# Patient Record
Sex: Male | Born: 1970 | Race: Black or African American | Hispanic: No | Smoking: Former smoker
Health system: Southern US, Community
[De-identification: ages and names within clinical notes are randomized; demographics above are authoritative.]

## PROBLEM LIST (undated history)

## (undated) DIAGNOSIS — E7800 Pure hypercholesterolemia, unspecified: Secondary | ICD-10-CM

## (undated) DIAGNOSIS — J45909 Unspecified asthma, uncomplicated: Secondary | ICD-10-CM

## (undated) DIAGNOSIS — I10 Essential (primary) hypertension: Secondary | ICD-10-CM

## (undated) HISTORY — PX: TOTAL HIP ARTHROPLASTY: SHX124

---

## 2008-04-17 ENCOUNTER — Emergency Department (HOSPITAL_COMMUNITY): Admission: EM | Admit: 2008-04-17 | Discharge: 2008-04-17 | Payer: Self-pay | Admitting: Emergency Medicine

## 2008-06-03 ENCOUNTER — Ambulatory Visit: Payer: Self-pay | Admitting: Family Medicine

## 2008-06-03 DIAGNOSIS — J189 Pneumonia, unspecified organism: Secondary | ICD-10-CM

## 2008-06-03 DIAGNOSIS — Z720 Tobacco use: Secondary | ICD-10-CM | POA: Insufficient documentation

## 2008-06-03 DIAGNOSIS — F17200 Nicotine dependence, unspecified, uncomplicated: Secondary | ICD-10-CM

## 2008-07-15 ENCOUNTER — Encounter (INDEPENDENT_AMBULATORY_CARE_PROVIDER_SITE_OTHER): Payer: Self-pay | Admitting: Family Medicine

## 2008-07-28 ENCOUNTER — Encounter (INDEPENDENT_AMBULATORY_CARE_PROVIDER_SITE_OTHER): Payer: Self-pay | Admitting: Family Medicine

## 2008-09-16 ENCOUNTER — Telehealth (INDEPENDENT_AMBULATORY_CARE_PROVIDER_SITE_OTHER): Payer: Self-pay | Admitting: *Deleted

## 2011-04-14 ENCOUNTER — Inpatient Hospital Stay (INDEPENDENT_AMBULATORY_CARE_PROVIDER_SITE_OTHER)
Admission: RE | Admit: 2011-04-14 | Discharge: 2011-04-14 | Disposition: A | Discharge status: Home / Self Care | Payer: 59 | Source: Ambulatory Visit | Attending: Family Medicine | Admitting: Family Medicine

## 2011-04-14 DIAGNOSIS — T148XXA Other injury of unspecified body region, initial encounter: Secondary | ICD-10-CM

## 2011-04-14 LAB — POCT I-STAT, CHEM 8
BUN: 6 mg/dL (ref 6–23)
Calcium, Ion: 1.07 mmol/L — ABNORMAL LOW (ref 1.12–1.32)
Chloride: 98 mEq/L (ref 96–112)
Creatinine, Ser: 1.5 mg/dL (ref 0.4–1.5)
TCO2: 28 mmol/L (ref 0–100)

## 2011-08-13 ENCOUNTER — Other Ambulatory Visit (HOSPITAL_COMMUNITY): Payer: Self-pay | Admitting: Family Medicine

## 2011-08-13 ENCOUNTER — Ambulatory Visit (HOSPITAL_COMMUNITY)
Admission: RE | Admit: 2011-08-13 | Discharge: 2011-08-13 | Disposition: A | Discharge status: Home / Self Care | Payer: 59 | Source: Ambulatory Visit | Attending: Family Medicine | Admitting: Family Medicine

## 2011-08-13 DIAGNOSIS — R1032 Left lower quadrant pain: Secondary | ICD-10-CM

## 2015-04-09 ENCOUNTER — Emergency Department (HOSPITAL_COMMUNITY)
Admission: EM | Admit: 2015-04-09 | Discharge: 2015-04-09 | Disposition: A | Discharge status: Home / Self Care | Payer: BLUE CROSS/BLUE SHIELD | Attending: Emergency Medicine | Admitting: Emergency Medicine

## 2015-04-09 ENCOUNTER — Encounter (HOSPITAL_COMMUNITY): Payer: Self-pay | Admitting: *Deleted

## 2015-04-09 ENCOUNTER — Emergency Department (HOSPITAL_COMMUNITY): Payer: BLUE CROSS/BLUE SHIELD

## 2015-04-09 DIAGNOSIS — J45909 Unspecified asthma, uncomplicated: Secondary | ICD-10-CM | POA: Insufficient documentation

## 2015-04-09 DIAGNOSIS — R0602 Shortness of breath: Secondary | ICD-10-CM | POA: Diagnosis present

## 2015-04-09 DIAGNOSIS — F1010 Alcohol abuse, uncomplicated: Secondary | ICD-10-CM | POA: Diagnosis not present

## 2015-04-09 DIAGNOSIS — Z720 Tobacco use: Secondary | ICD-10-CM | POA: Insufficient documentation

## 2015-04-09 DIAGNOSIS — J40 Bronchitis, not specified as acute or chronic: Secondary | ICD-10-CM

## 2015-04-09 HISTORY — DX: Unspecified asthma, uncomplicated: J45.909

## 2015-04-09 LAB — COMPREHENSIVE METABOLIC PANEL
ALBUMIN: 4.5 g/dL (ref 3.5–5.0)
ALK PHOS: 84 U/L (ref 38–126)
ALT: 34 U/L (ref 17–63)
ANION GAP: 15 (ref 5–15)
AST: 121 U/L — AB (ref 15–41)
BUN: 8 mg/dL (ref 6–20)
CALCIUM: 9.2 mg/dL (ref 8.9–10.3)
CHLORIDE: 97 mmol/L — AB (ref 101–111)
CO2: 30 mmol/L (ref 22–32)
CREATININE: 0.62 mg/dL (ref 0.61–1.24)
GFR calc non Af Amer: >60 mL/min (ref >60)
GLUCOSE: 92 mg/dL (ref 65–99)
POTASSIUM: 3.2 mmol/L — AB (ref 3.5–5.1)
SODIUM: 142 mmol/L (ref 135–145)
Total Bilirubin: 1.1 mg/dL (ref 0.3–1.2)
Total Protein: 7.8 g/dL (ref 6.5–8.1)

## 2015-04-09 LAB — CBC WITH DIFFERENTIAL/PLATELET
BASOS ABS: 0 10*3/uL (ref 0.0–0.1)
Basophils Relative: 1 % (ref 0–1)
Eosinophils Absolute: 0.1 10*3/uL (ref 0.0–0.7)
Eosinophils Relative: 1 % (ref 0–5)
HCT: 39.1 % (ref 39.0–52.0)
HEMOGLOBIN: 13.4 g/dL (ref 13.0–17.0)
LYMPHS ABS: 2.4 10*3/uL (ref 0.7–4.0)
LYMPHS PCT: 53 % — AB (ref 12–46)
MCH: 35 pg — AB (ref 26.0–34.0)
MCHC: 34.3 g/dL (ref 30.0–36.0)
MCV: 102.1 fL — ABNORMAL HIGH (ref 78.0–100.0)
MONO ABS: 0.6 10*3/uL (ref 0.1–1.0)
Monocytes Relative: 13 % — ABNORMAL HIGH (ref 3–12)
NEUTROS ABS: 1.4 10*3/uL — AB (ref 1.7–7.7)
NEUTROS PCT: 32 % — AB (ref 43–77)
Platelets: 123 10*3/uL — ABNORMAL LOW (ref 150–400)
RBC: 3.83 MIL/uL — AB (ref 4.22–5.81)
RDW: 12.5 % (ref 11.5–15.5)
WBC: 4.6 10*3/uL (ref 4.0–10.5)

## 2015-04-09 LAB — ETHANOL: ALCOHOL ETHYL (B): 413 mg/dL — AB (ref <5)

## 2015-04-09 MED ORDER — ALBUTEROL SULFATE (2.5 MG/3ML) 0.083% IN NEBU
2.5000 mg | INHALATION_SOLUTION | Freq: Once | RESPIRATORY_TRACT | Status: AC
  Administered 2015-04-09: 2.5 mg via RESPIRATORY_TRACT
  Filled 2015-04-09: qty 3

## 2015-04-09 MED ORDER — POTASSIUM CHLORIDE CRYS ER 20 MEQ PO TBCR
40.0000 meq | EXTENDED_RELEASE_TABLET | Freq: Once | ORAL | Status: DC
  Filled 2015-04-09: qty 2

## 2015-04-09 MED ORDER — ALBUTEROL SULFATE HFA 108 (90 BASE) MCG/ACT IN AERS
2.0000 | INHALATION_SPRAY | RESPIRATORY_TRACT | Status: DC | PRN
  Administered 2015-04-09: 2 via RESPIRATORY_TRACT
  Filled 2015-04-09: qty 6.7

## 2015-04-09 NOTE — Discharge Instructions (Signed)
Stop drinking and follow up with [facility] to get help with your drinking

## 2015-04-09 NOTE — ED Provider Notes (Signed)
CSN: [id number]     Arrival date & time 04/09/15  1709 History   First MD Initiated Contact with Patient 04/09/15 1806     Chief Complaint  Patient presents with  . Shortness of Breath     (Consider location/radiation/quality/duration/timing/severity/associated sxs/prior Treatment) Patient is a 44 y.o. male presenting with cough. The history is provided by the patient (the pt complains of a cough).  Cough Cough characteristics:  Non-productive Severity:  Mild Onset quality:  Gradual Timing:  Constant Progression:  Waxing and waning Chronicity:  New Smoker: no   Context: not animal exposure   Associated symptoms: no chest pain, no eye discharge, no headaches and no rash     Past Medical History  Diagnosis Date  . Asthma    History reviewed. No pertinent past surgical history. History reviewed. No pertinent family history. History  Substance Use Topics  . Smoking status: Current Every Day Smoker -- 0.50 packs/day    Types: Cigarettes  . Smokeless tobacco: Not on file  . Alcohol Use: Yes     Comment: drinks until he passes out daily. has no desire to stop    Review of Systems  Constitutional: Negative for appetite change and fatigue.  HENT: Negative for congestion, ear discharge and sinus pressure.   Eyes: Negative for discharge.  Respiratory: Positive for cough.   Cardiovascular: Negative for chest pain.  Gastrointestinal: Negative for abdominal pain and diarrhea.  Genitourinary: Negative for frequency and hematuria.  Musculoskeletal: Negative for back pain.  Skin: Negative for rash.  Neurological: Negative for seizures and headaches.  Psychiatric/Behavioral: Negative for hallucinations.      Allergies  Review of patient's allergies indicates no known allergies.  Home Medications   Prior to Admission medications   Not on File   BP 121/88 mmHg  Pulse 113  Temp(Src) 97.3 F (36.3 C) (Oral)  Resp 16  Ht 5\' 7"  (1.702 m)  Wt 130 lb (58.968 kg)  BMI 20.36  kg/m2  SpO2 98% Physical Exam  Constitutional: He is oriented to person, place, and time. He appears well-developed.  HENT:  Head: Normocephalic.  Eyes: Conjunctivae and EOM are normal. No scleral icterus.  Neck: Neck supple. No thyromegaly present.  Cardiovascular: Normal rate and regular rhythm.  Exam reveals no gallop and no friction rub.   No murmur heard. Pulmonary/Chest: No stridor. He has no wheezes. He has no rales. He exhibits no tenderness.  Abdominal: He exhibits no distension. There is no tenderness. There is no rebound.  Musculoskeletal: Normal range of motion. He exhibits no edema.  Lymphadenopathy:    He has no cervical adenopathy.  Neurological: He is oriented to person, place, and time. He exhibits normal muscle tone. Coordination normal.  Skin: No rash noted. No erythema.  Psychiatric: He has a normal mood and affect. His behavior is normal.    ED Course  Procedures (including critical care time) Labs Review Labs Reviewed  CBC WITH DIFFERENTIAL/PLATELET - Abnormal; Notable for the following:    RBC 3.83 (*)    MCV 102.1 (*)    MCH 35.0 (*)    Platelets 123 (*)    Neutrophils Relative % 32 (*)    Neutro Abs 1.4 (*)    Lymphocytes Relative 53 (*)    Monocytes Relative 13 (*)    All other components within normal limits  COMPREHENSIVE METABOLIC PANEL - Abnormal; Notable for the following:    Potassium 3.2 (*)    Chloride 97 (*)    AST 121 (*)  All other components within normal limits  ETHANOL - Abnormal; Notable for the following:    Alcohol, Ethyl (B) 413 (*)    All other components within normal limits    Imaging Review Dg Chest 2 View  04/09/2015   CLINICAL DATA:  Shortness of Breath  EXAM: CHEST  2 VIEW  COMPARISON:  04/17/2008  FINDINGS: The heart size and mediastinal contours are within normal limits. Both lungs are clear. The visualized skeletal structures are unremarkable.  IMPRESSION: No active cardiopulmonary disease.   Electronically Signed    By: Alcide Clever M.D.   On: 04/09/2015 18:08     EKG Interpretation   Date/Time:  Saturday Apr 09 2015 17:33:02 EDT Ventricular Rate:  94 PR Interval:  160 QRS Duration: 90 QT Interval:  370 QTC Calculation: 463 R Axis:   69 Text Interpretation:  Sinus rhythm No old tracing to compare ECG OTHERWISE  WITHIN NORMAL LIMITS Confirmed by Juleen China  MD, STEPHEN ([id number]) on 04/09/2015  6:02:46 PM      MDM   Final diagnoses:  Bronchitis  ETOH abuse    Bronchitis and etoh abuse.  Pt told to stop drinking and referred to out pt tx   Bethann Berkshire, MD 04/09/15 2018

## 2015-04-09 NOTE — ED Notes (Signed)
Discharge instructions given, pt demonstrated teach back and verbal understanding. No concerns voiced.  

## 2015-04-09 NOTE — ED Notes (Addendum)
4 week history of intermittent shortness of breath.  States today "it just choked me up". Also reports anxiety, chest tightness, palpitations. Positive for cough, chills.  Denies n/v/d, sore throat. Patient tearful in triage.

## 2015-04-09 NOTE — ED Notes (Signed)
CRITICAL VALUE ALERT  Critical value received:  ETOH  Date of notification:  04/09/15 Time of notification:  1900  Critical value read back:Yes.    Nurse who received alert:  Yordi Krager,RN  MD notified (1st page):  Dr Estell HarpinZammit  Time of first page:  1900

## 2015-08-15 ENCOUNTER — Encounter (HOSPITAL_COMMUNITY): Payer: Self-pay | Admitting: *Deleted

## 2015-08-15 ENCOUNTER — Emergency Department (HOSPITAL_COMMUNITY): Payer: BLUE CROSS/BLUE SHIELD

## 2015-08-15 ENCOUNTER — Emergency Department (HOSPITAL_COMMUNITY)
Admission: EM | Admit: 2015-08-15 | Discharge: 2015-08-15 | Disposition: A | Discharge status: Home / Self Care | Payer: BLUE CROSS/BLUE SHIELD | Attending: Emergency Medicine | Admitting: Emergency Medicine

## 2015-08-15 DIAGNOSIS — Z720 Tobacco use: Secondary | ICD-10-CM | POA: Insufficient documentation

## 2015-08-15 DIAGNOSIS — J45909 Unspecified asthma, uncomplicated: Secondary | ICD-10-CM | POA: Insufficient documentation

## 2015-08-15 DIAGNOSIS — R569 Unspecified convulsions: Secondary | ICD-10-CM | POA: Diagnosis not present

## 2015-08-15 DIAGNOSIS — F1010 Alcohol abuse, uncomplicated: Secondary | ICD-10-CM

## 2015-08-15 LAB — CBC WITH DIFFERENTIAL/PLATELET
BASOS ABS: 0 10*3/uL (ref 0.0–0.1)
BASOS PCT: 0 %
EOS ABS: 0 10*3/uL (ref 0.0–0.7)
EOS PCT: 0 %
HCT: 36.5 % — ABNORMAL LOW (ref 39.0–52.0)
Hemoglobin: 12.8 g/dL — ABNORMAL LOW (ref 13.0–17.0)
Lymphocytes Relative: 25 %
Lymphs Abs: 1.2 10*3/uL (ref 0.7–4.0)
MCH: 36.8 pg — ABNORMAL HIGH (ref 26.0–34.0)
MCHC: 35.1 g/dL (ref 30.0–36.0)
MCV: 104.9 fL — ABNORMAL HIGH (ref 78.0–100.0)
MONO ABS: 0.5 10*3/uL (ref 0.1–1.0)
Monocytes Relative: 10 %
NEUTROS ABS: 2.9 10*3/uL (ref 1.7–7.7)
Neutrophils Relative %: 64 %
PLATELETS: 117 10*3/uL — AB (ref 150–400)
RBC: 3.48 MIL/uL — ABNORMAL LOW (ref 4.22–5.81)
RDW: 13.2 % (ref 11.5–15.5)
WBC: 4.6 10*3/uL (ref 4.0–10.5)

## 2015-08-15 LAB — COMPREHENSIVE METABOLIC PANEL
ALBUMIN: 4.3 g/dL (ref 3.5–5.0)
ALT: 33 U/L (ref 17–63)
AST: 119 U/L — AB (ref 15–41)
Alkaline Phosphatase: 67 U/L (ref 38–126)
Anion gap: 13 (ref 5–15)
BUN: 6 mg/dL (ref 6–20)
CHLORIDE: 94 mmol/L — AB (ref 101–111)
CO2: 28 mmol/L (ref 22–32)
Calcium: 9.4 mg/dL (ref 8.9–10.3)
Creatinine, Ser: 0.71 mg/dL (ref 0.61–1.24)
GFR calc Af Amer: >60 mL/min (ref >60)
GFR calc non Af Amer: >60 mL/min (ref >60)
GLUCOSE: 157 mg/dL — AB (ref 65–99)
POTASSIUM: 2.7 mmol/L — AB (ref 3.5–5.1)
Sodium: 135 mmol/L (ref 135–145)
Total Bilirubin: 1.4 mg/dL — ABNORMAL HIGH (ref 0.3–1.2)
Total Protein: 7.7 g/dL (ref 6.5–8.1)

## 2015-08-15 LAB — RAPID URINE DRUG SCREEN, HOSP PERFORMED
AMPHETAMINES: NOT DETECTED
BARBITURATES: NOT DETECTED
BENZODIAZEPINES: NOT DETECTED
COCAINE: NOT DETECTED
Opiates: NOT DETECTED
Tetrahydrocannabinol: NOT DETECTED

## 2015-08-15 LAB — AMMONIA: AMMONIA: 55 umol/L — AB (ref 9–35)

## 2015-08-15 LAB — ETHANOL: ALCOHOL ETHYL (B): 9 mg/dL — AB (ref <5)

## 2015-08-15 MED ORDER — POTASSIUM CHLORIDE CRYS ER 20 MEQ PO TBCR
40.0000 meq | EXTENDED_RELEASE_TABLET | Freq: Once | ORAL | Status: AC
  Administered 2015-08-15: 40 meq via ORAL
  Filled 2015-08-15: qty 2

## 2015-08-15 MED ORDER — LORAZEPAM 2 MG/ML IJ SOLN
1.0000 mg | Freq: Once | INTRAMUSCULAR | Status: AC
  Administered 2015-08-15: 1 mg via INTRAVENOUS
  Filled 2015-08-15: qty 1

## 2015-08-15 MED ORDER — POTASSIUM CHLORIDE 10 MEQ/100ML IV SOLN
10.0000 meq | INTRAVENOUS | Status: AC
  Administered 2015-08-15 (×3): 10 meq via INTRAVENOUS
  Filled 2015-08-15 (×3): qty 100

## 2015-08-15 NOTE — ED Provider Notes (Signed)
CSN: [id number]     Arrival date & time 08/15/15  0125 History   First MD Initiated Contact with Patient 08/15/15 0135     Chief Complaint  Patient presents with  . Seizures     (Consider location/radiation/quality/duration/timing/severity/associated sxs/prior Treatment) HPI Comments: Level V caveat for altered mental status. 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 Found lying on the floor with questionable seizure activity and foam coming from his mouth. Patient now awake and alert and oriented 3. States last thing he remembers was working with a hose in the next thing he knows that he EMS was there. He denies any dizziness or lightheadedness. No chest pain or shortness of breath. No headache. No fever. States he's been sick recently with upper respiratory symptoms. No history of seizures. No tongue biting or urinary incontinence. Notably patient is a heavy drinker about 6-7 drinks daily but has not had a drink in about 4 days due to his respiratory illness. States he has not had a seizure from not drinking in the past.  Patient is a 44 y.o. male presenting with seizures. The history is provided by the patient and the EMS personnel. The history is limited by the condition of the patient.  Seizures   Past Medical History  Diagnosis Date  . Asthma    History reviewed. No pertinent past surgical history. History reviewed. No pertinent family history. Social History  Substance Use Topics  . Smoking status: Current Every Day Smoker -- 0.50 packs/day    Types: Cigarettes  . Smokeless tobacco: None  . Alcohol Use: Yes     Comment: drinks until he passes out daily. has no desire to stop    Review of Systems  Constitutional: Negative for fever, activity change and appetite change.  HENT: Negative for congestion and rhinorrhea.   Respiratory: Negative for chest tightness and shortness of breath.   Cardiovascular: Negative for chest pain.  Gastrointestinal: Negative  for nausea, vomiting and abdominal pain.  Genitourinary: Negative for dysuria, hematuria and testicular pain.  Musculoskeletal: Negative for myalgias, back pain and arthralgias.  Skin: Negative for wound.  Neurological: Positive for seizures. Negative for dizziness, weakness and headaches.  A complete 10 system review of systems was obtained and all systems are negative except as noted in the HPI and PMH.      Allergies  Review of patient's allergies indicates no known allergies.  Home Medications   Prior to Admission medications   Not on File   BP 145/95 mmHg  Pulse 74  Temp(Src) 98.3 F (36.8 C) (Oral)  Resp 14  Ht  (1.702 m)  Wt 135 lb (61.236 kg)  BMI 21.14 kg/m2  SpO2 100% Physical Exam  Constitutional: He is oriented to person, place, and time. He appears well-developed and well-nourished. No distress.  Not tremulous  HENT:  Head: Normocephalic and atraumatic.  Mouth/Throat: Oropharynx is clear and moist. No oropharyngeal exudate.  Eyes: Conjunctivae and EOM are normal. Pupils are equal, round, and reactive to light.  Neck: Normal range of motion. Neck supple.  No C spine tenderness  Cardiovascular: Normal rate, regular rhythm, normal heart sounds and intact distal pulses.   No murmur heard. Pulmonary/Chest: Effort normal and breath sounds normal. No respiratory distress. He exhibits no tenderness.  Abdominal: Soft. There is no tenderness. There is no rebound and no guarding.  Musculoskeletal: Normal range of motion. He exhibits no edema or tenderness.  Neurological: He is alert and oriented to person, place, and  time. No cranial nerve deficit. He exhibits normal muscle tone. Coordination normal.  No ataxia on finger to nose bilaterally. No pronator drift. 5/5 strength throughout. CN 2-12 intact.  Equal grip strength. Sensation intact.   Skin: Skin is warm.  Psychiatric: He has a normal mood and affect. His behavior is normal.  Nursing note and vitals  reviewed.   ED Course  Procedures (including critical care time) Labs Review Labs Reviewed  CBC WITH DIFFERENTIAL/PLATELET - Abnormal; Notable for the following:    RBC 3.48 (*)    Hemoglobin 12.8 (*)    HCT 36.5 (*)    MCV 104.9 (*)    MCH 36.8 (*)    Platelets 117 (*)    All other components within normal limits  COMPREHENSIVE METABOLIC PANEL - Abnormal; Notable for the following:    Potassium 2.7 (*)    Chloride 94 (*)    Glucose, Bld 157 (*)    AST 119 (*)    Total Bilirubin 1.4 (*)    All other components within normal limits  ETHANOL - Abnormal; Notable for the following:    Alcohol, Ethyl (B) 9 (*)    All other components within normal limits  AMMONIA - Abnormal; Notable for the following:    Ammonia 55 (*)    All other components within normal limits  URINE RAPID DRUG SCREEN, HOSP PERFORMED    Imaging Review Dg Chest 2 View  08/15/2015   CLINICAL DATA:  Status post seizure. Found lying on floor. Initial encounter.  EXAM: CHEST  2 VIEW  COMPARISON:  Chest radiograph from 04/09/2015  FINDINGS: The lungs are well-aerated and clear. There is no evidence of focal opacification, pleural effusion or pneumothorax.  The heart is normal in size; the mediastinal contour is within normal limits. No acute osseous abnormalities are seen.  IMPRESSION: No acute cardiopulmonary process seen.   Electronically Signed   By: Roanna Raider M.D.   On: 08/15/2015 02:52   Ct Head Wo Contrast  08/15/2015   CLINICAL DATA:  Status post possible seizure.  Initial encounter.  EXAM: CT HEAD WITHOUT CONTRAST  TECHNIQUE: Contiguous axial images were obtained from the base of the skull through the vertex without intravenous contrast.  COMPARISON:  None.  FINDINGS: There is no evidence of acute infarction, mass lesion, or intra- or extra-axial hemorrhage on CT.  Prominence of the sulci suggests mild cortical volume loss. Mild cerebellar atrophy is noted.  The brainstem and fourth ventricle are within  normal limits. The basal ganglia are unremarkable in appearance. The cerebral hemispheres demonstrate grossly normal gray-white differentiation. No mass effect or midline shift is seen.  There is no evidence of fracture; visualized osseous structures are unremarkable in appearance. The orbits are within normal limits. The paranasal sinuses and mastoid air cells are well-aerated. No significant soft tissue abnormalities are seen.  IMPRESSION: 1. No acute intracranial pathology seen on CT. 2. Mild cortical volume loss noted.   Electronically Signed   By: Roanna Raider M.D.   On: 08/15/2015 03:02   I have personally reviewed and evaluated these images and lab results as part of my medical decision-making.   EKG Interpretation   Date/Time:  Monday August 15 2015 01:46:22 EDT Ventricular Rate:  79 PR Interval:  155 QRS Duration: 94 QT Interval:  435 QTC Calculation: 499 R Axis:   45 Text Interpretation:  Sinus rhythm Left ventricular hypertrophy Borderline  prolonged QT interval No significant change was found Confirmed by Manus Gunning   MD,  Tisheena Maguire [phone]) on 08/15/2015 1:53:52 AM      MDM   Final diagnoses:  Seizure (HCC)  Alcohol abuse   Patient with apparent first-time seizure activity, possibly related to alcohol withdrawal. Patient is not tremulous and not tachycardic however.  IV Ativan, CT head, labs  CT head is normal. Potassium low at 2.7. Ammonia high at 55. Alcohol level 9  Potassium replaced. Patient now somnolent after ativan.  No tachycardia.  No tremors. No evidence of DTs.  7am, patient awake, alert, oriented x3.  He is tolerating PO and ambulatory.  He is at his baseline per his mother in the room. Seems to have new onset seizure. Possibly related to alcohol withdrawal.  No evidence of DTs.  Recommend cessation of alcohol use and followup with neurology. Return precautions discussed.    Glynn Octave, MD 08/15/15 2352

## 2015-08-15 NOTE — ED Notes (Signed)
Patient able to tolerate PO fluids to take PO medications.

## 2015-08-15 NOTE — ED Notes (Signed)
CRITICAL VALUE ALERT  Critical value received: K+ 2.7 Date of notification:  08/15/15  Time of notification:  0215  Critical value read back:Yes.    Nurse who received alert:  JRM  MD notified (1st page):  Rancour  Time of first page:  0215  MD notified (2nd page):  Time of second page:  Responding MD:  Rancour  Time MD responded:  2015

## 2015-08-15 NOTE — ED Notes (Signed)
Pt brought in by [facility] for c/o possible seizure; pt was found by employees lying in the floor with white foam coming from mouth; ems states when they arrived on the scene, pt was in post-ictal stage; pt is alert and oriented at this time

## 2015-08-15 NOTE — ED Notes (Signed)
Patient resting. No distress. Respirations even and unlabored. Family at bedside.

## 2015-08-16 NOTE — Discharge Instructions (Signed)
Seizure, Adult Follow up with the neurologist. Return to the ED if you develop new or worsening symptoms.  A seizure is abnormal electrical activity in the brain. Seizures usually last from 30 seconds to 2 minutes. There are various types of seizures. Before a seizure, you may have a warning sensation (aura) that a seizure is about to occur. An aura may include the following symptoms:   Fear or anxiety.  Nausea.  Feeling like the room is spinning (vertigo).  Vision changes, such as seeing flashing lights or spots. Common symptoms during a seizure include:  A change in attention or behavior (altered mental status).  Convulsions with rhythmic jerking movements.  Drooling.  Rapid eye movements.  Grunting.  Loss of bladder and bowel control.  Bitter taste in the mouth.  Tongue biting. After a seizure, you may feel confused and sleepy. You may also have an injury resulting from convulsions during the seizure. HOME CARE INSTRUCTIONS   If you are given medicines, take them exactly as prescribed by your health care provider.  Keep all follow-up appointments as directed by your health care provider.  Do not swim or drive or engage in risky activity during which a seizure could cause further injury to you or others until your health care provider says it is OK.  Get adequate rest.  Teach friends and family what to do if you have a seizure. They should:  Lay you on the ground to prevent a fall.  Put a cushion under your head.  Loosen any tight clothing around your neck.  Turn you on your side. If vomiting occurs, this helps keep your airway clear.  Stay with you until you recover.  Know whether or not you need emergency care. SEEK IMMEDIATE MEDICAL CARE IF:  The seizure lasts longer than 5 minutes.  The seizure is severe or you do not wake up immediately after the seizure.  You have an altered mental status after the seizure.  You are having more frequent or  worsening seizures. Someone should drive you to the emergency department or call local emergency services (911 in [place] MAKE SURE YOU:  Understand these instructions.  Will watch your condition.  Will get help right away if you are not doing well or get worse. Document Released: 10/26/2000 Document Revised: 08/19/2013 Document Reviewed: 06/10/2013 [facility] -  Patient Information 2015 [place], [place]. This information is not intended to replace advice given to you by your health care provider. Make sure you discuss any questions you have with your health care provider.  Alcohol Use Disorder Alcohol use disorder is a mental disorder. It is not a one-time incident of heavy drinking. Alcohol use disorder is the excessive and uncontrollable use of alcohol over time that leads to problems with functioning in one or more areas of daily living. People with this disorder risk harming themselves and others when they drink to excess. Alcohol use disorder also can cause other mental disorders, such as mood and anxiety disorders, and serious physical problems. People with alcohol use disorder often misuse other drugs.  Alcohol use disorder is common and widespread. Some people with this disorder drink alcohol to cope with or escape from negative life events. Others drink to relieve chronic pain or symptoms of mental illness. People with a family history of alcohol use disorder are at higher risk of losing control and using alcohol to excess.  SYMPTOMS  Signs and symptoms of alcohol use disorder may include the following:   Consumption ofalcohol inlarger amounts or over  a longer period of time than intended.  Multiple unsuccessful attempts to cutdown or control alcohol use.   A great deal of time spent obtaining alcohol, using alcohol, or recovering from the effects of alcohol (hangover).  A strong desire or urge to use alcohol (cravings).   Continued use of alcohol despite problems at work, school,  or home because of alcohol use.   Continued use of alcohol despite problems in relationships because of alcohol use.  Continued use of alcohol in situations when it is physically hazardous, such as driving a car.  Continued use of alcohol despite awareness of a physical or psychological problem that is likely related to alcohol use. Physical problems related to alcohol use can involve the brain, heart, liver, stomach, and intestines. Psychological problems related to alcohol use include intoxication, depression, anxiety, psychosis, delirium, and dementia.   The need for increased amounts of alcohol to achieve the same desired effect, or a decreased effect from the consumption of the same amount of alcohol (tolerance).  Withdrawal symptoms upon reducing or stopping alcohol use, or alcohol use to reduce or avoid withdrawal symptoms. Withdrawal symptoms include:  Racing heart.  Hand tremor.  Difficulty sleeping.  Nausea.  Vomiting.  Hallucinations.  Restlessness.  Seizures. DIAGNOSIS Alcohol use disorder is diagnosed through an assessment by your health care provider. Your health care provider may start by asking three or four questions to screen for excessive or problematic alcohol use. To confirm a diagnosis of alcohol use disorder, at least two symptoms must be present within a 58-month period. The severity of alcohol use disorder depends on the number of symptoms:  Mild--two or three.  Moderate--four or five.  Severe--six or more. Your health care provider may perform a physical exam or use results from lab tests to see if you have physical problems resulting from alcohol use. Your health care provider may refer you to a mental health professional for evaluation. TREATMENT  Some people with alcohol use disorder are able to reduce their alcohol use to low-risk levels. Some people with alcohol use disorder need to quit drinking alcohol. When necessary, mental health  professionals with specialized training in substance use treatment can help. Your health care provider can help you decide how severe your alcohol use disorder is and what type of treatment you need. The following forms of treatment are available:   Detoxification. Detoxification involves the use of prescription medicines to prevent alcohol withdrawal symptoms in the first week after quitting. This is important for people with a history of symptoms of withdrawal and for heavy drinkers who are likely to have withdrawal symptoms. Alcohol withdrawal can be dangerous and, in severe cases, cause death. Detoxification is usually provided in a hospital or in-patient substance use treatment facility.  Counseling or talk therapy. Talk therapy is provided by substance use treatment counselors. It addresses the reasons people use alcohol and ways to keep them from drinking again. The goals of talk therapy are to help people with alcohol use disorder find healthy activities and ways to cope with life stress, to identify and avoid triggers for alcohol use, and to handle cravings, which can cause relapse.  Medicines.Different medicines can help treat alcohol use disorder through the following actions:  Decrease alcohol cravings.  Decrease the positive reward response felt from alcohol use.  Produce an uncomfortable physical reaction when alcohol is used (aversion therapy).  Support groups. Support groups are run by people who have quit drinking. They provide emotional support, advice, and guidance. These  forms of treatment are often combined. Some people with alcohol use disorder benefit from intensive combination treatment provided by specialized substance use treatment centers. Both inpatient and outpatient treatment programs are available. Document Released: 12/06/2004 Document Revised: 03/15/2014 Document Reviewed: 02/05/2013 [facility] Patient Information 2015 [place], [place]. This information is not intended  to replace advice given to you by your health care provider. Make sure you discuss any questions you have with your health care provider.

## 2016-01-22 ENCOUNTER — Emergency Department (HOSPITAL_COMMUNITY)
Admission: EM | Admit: 2016-01-22 | Discharge: 2016-01-22 | Disposition: A | Discharge status: Home / Self Care | Payer: BLUE CROSS/BLUE SHIELD | Attending: Emergency Medicine | Admitting: Emergency Medicine

## 2016-01-22 ENCOUNTER — Encounter (HOSPITAL_COMMUNITY): Payer: Self-pay | Admitting: Emergency Medicine

## 2016-01-22 ENCOUNTER — Emergency Department (HOSPITAL_COMMUNITY): Payer: BLUE CROSS/BLUE SHIELD

## 2016-01-22 DIAGNOSIS — B349 Viral infection, unspecified: Secondary | ICD-10-CM | POA: Diagnosis not present

## 2016-01-22 DIAGNOSIS — R05 Cough: Secondary | ICD-10-CM | POA: Diagnosis present

## 2016-01-22 DIAGNOSIS — F17210 Nicotine dependence, cigarettes, uncomplicated: Secondary | ICD-10-CM | POA: Diagnosis not present

## 2016-01-22 DIAGNOSIS — J45909 Unspecified asthma, uncomplicated: Secondary | ICD-10-CM | POA: Insufficient documentation

## 2016-01-22 DIAGNOSIS — Z202 Contact with and (suspected) exposure to infections with a predominantly sexual mode of transmission: Secondary | ICD-10-CM | POA: Insufficient documentation

## 2016-01-22 LAB — URINALYSIS, ROUTINE W REFLEX MICROSCOPIC
BILIRUBIN URINE: NEGATIVE
GLUCOSE, UA: NEGATIVE mg/dL
Ketones, ur: NEGATIVE mg/dL
Leukocytes, UA: NEGATIVE
NITRITE: NEGATIVE
PH: 6 (ref 5.0–8.0)
Protein, ur: NEGATIVE mg/dL

## 2016-01-22 LAB — URINE MICROSCOPIC-ADD ON
Bacteria, UA: NONE SEEN
RBC / HPF: NONE SEEN RBC/hpf (ref 0–5)
Squamous Epithelial / LPF: NONE SEEN
WBC UA: NONE SEEN WBC/hpf (ref 0–5)

## 2016-01-22 MED ORDER — LIDOCAINE HCL (PF) 1 % IJ SOLN
INTRAMUSCULAR | Status: AC
  Administered 2016-01-22: 13:00:00
  Filled 2016-01-22: qty 5

## 2016-01-22 MED ORDER — HYDROCODONE-HOMATROPINE 5-1.5 MG/5ML PO SYRP: 5.0000 mL | ORAL_SOLUTION | Freq: Four times a day (QID) | ORAL | Status: DC | PRN

## 2016-01-22 MED ORDER — ACETAMINOPHEN 500 MG PO TABS
1000.0000 mg | ORAL_TABLET | Freq: Once | ORAL | Status: AC
  Administered 2016-01-22: 1000 mg via ORAL
  Filled 2016-01-22: qty 2

## 2016-01-22 MED ORDER — CEFTRIAXONE SODIUM 250 MG IJ SOLR
250.0000 mg | Freq: Once | INTRAMUSCULAR | Status: AC
  Administered 2016-01-22: 250 mg via INTRAMUSCULAR
  Filled 2016-01-22: qty 250

## 2016-01-22 MED ORDER — DOXYCYCLINE HYCLATE 100 MG PO CAPS: 100.0000 mg | ORAL_CAPSULE | Freq: Two times a day (BID) | ORAL | Status: DC

## 2016-01-22 MED ORDER — DOXYCYCLINE HYCLATE 100 MG PO TABS
100.0000 mg | ORAL_TABLET | Freq: Two times a day (BID) | ORAL | Status: DC
  Administered 2016-01-22: 100 mg via ORAL
  Filled 2016-01-22: qty 1

## 2016-01-22 MED ORDER — HYDROCOD POLST-CPM POLST ER 10-8 MG/5ML PO SUER
5.0000 mL | Freq: Once | ORAL | Status: AC
  Administered 2016-01-22: 5 mL via ORAL
  Filled 2016-01-22: qty 5

## 2016-01-22 NOTE — ED Notes (Signed)
Patient c/o productive, congested cough x1 week. Per patient thick green sputum. Patient unsure of fevers but reports "chills and sweats with body aches." Patient reports taking TheraFlu 2 days ago with no relief.

## 2016-01-22 NOTE — Discharge Instructions (Signed)
Your examination favors a viral illness. Please wash hands frequently. Please use a mask until symptoms have resolved. Please increase fluids. Use Tylenol every 4 hours, or ibuprofen every 6 hours. Use Hycodan for cough and congestion. Use doxycycline daily with food until all taken. Use Hycodan for cough and congestion. This medication may cause drowsiness, use with caution. Please refrain from all sexual activity for the next 7 days. Viral Infections A virus is a type of germ. Viruses can cause:  Minor sore throats.  Aches and pains.  Headaches.  Runny nose.  Rashes.  Watery eyes.  Tiredness.  Coughs.  Loss of appetite.  Feeling sick to your stomach (nausea).  Throwing up (vomiting).  Watery poop (diarrhea). HOME CARE   Only take medicines as told by your doctor.  Drink enough water and fluids to keep your pee (urine) clear or pale yellow. Sports drinks are a good choice.  Get plenty of rest and eat healthy. Soups and broths with crackers or rice are fine. GET HELP RIGHT AWAY IF:   You have a very bad headache.  You have shortness of breath.  You have chest pain or neck pain.  You have an unusual rash.  You cannot stop throwing up.  You have watery poop that does not stop.  You cannot keep fluids down.  You or your child has a temperature by mouth above 102 F (38.9 C), not controlled by medicine.  Your baby is older than 3 months with a rectal temperature of 102 F (38.9 C) or higher.  Your baby is 723 months old or younger with a rectal temperature of 100.4 F (38 C) or higher. MAKE SURE YOU:   Understand these instructions.  Will watch this condition.  Will get help right away if you are not doing well or get worse.   This information is not intended to replace advice given to you by your health care provider. Make sure you discuss any questions you have with your health care provider.   Document Released: 10/11/2008 Document Revised: 01/21/2012  Document Reviewed: 04/06/2015 [employer] Interactive Patient Education [employer]2016 [employer]

## 2016-01-22 NOTE — ED Provider Notes (Signed)
CSN: [id number]     Arrival date & time 01/22/16  1107 History  By signing my name below, I, Ronney LionSuzanne Le, attest that this documentation has been prepared under the direction and in the presence of Ivery QualeHobson Brodin Gelpi, PA-C. Electronically Signed: Ronney LionSuzanne Le, ED Scribe. 01/22/2016. 12:28 PM.   Chief Complaint  Patient presents with  . Cough   Patient is a 45 y.o. male presenting with cough. The history is provided by the patient. No language interpreter was used.  Cough Cough characteristics:  Productive Sputum characteristics:  Green Severity:  Moderate Onset quality:  Gradual Duration:  1 week Timing:  Constant Progression:  Worsening Relieved by:  Nothing Worsened by:  Nothing tried Ineffective treatments: Theraflu. Associated symptoms: chills, fever (subjective) and myalgias (generalized)   Associated symptoms: no rash    HPI Comments: Donald Stewart is a 45 y.o. male who presents to the Emergency Department with multiple complains, including a gradual-onset, constant, moderate cough productive with thick green sputum that began 1 week ago. He notes associated subjective fever and chills, vomiting, diarrhea, and generalized myalgias. Patient states he tried taking Theraflu 2 days ago, with no relief. He denies a history of any immunocompromising conditions, DM, hepatitis C, organ transplants, or recent international travel.  He denies any rash.   Past Medical History  Diagnosis Date  . Asthma    History reviewed. No pertinent past surgical history. History reviewed. No pertinent family history. Social History  Substance Use Topics  . Smoking status: Current Every Day Smoker -- 0.50 packs/day for 20 years    Types: Cigarettes  . Smokeless tobacco: Never Used  . Alcohol Use: Yes     Comment: drinks until he passes out daily. has no desire to stop    Review of Systems  Constitutional: Positive for fever (subjective) and chills.  Respiratory: Positive for cough.    Gastrointestinal: Positive for vomiting and diarrhea.  Musculoskeletal: Positive for myalgias (generalized).  Skin: Negative for rash.  Allergic/Immunologic: Negative for immunocompromised state.  All other systems reviewed and are negative.  Allergies  Review of patient's allergies indicates no known allergies.  Home Medications   Prior to Admission medications   Not on File   BP 118/85 mmHg  Pulse 90  Temp(Src) 97.7 F (36.5 C) (Oral)  Resp 18  Ht 5\' 7"  (1.702 m)  Wt 120 lb (54.432 kg)  BMI 18.79 kg/m2  SpO2 97% Physical Exam  Constitutional: He is oriented to person, place, and time. He appears well-developed and well-nourished. No distress.  HENT:  Head: Normocephalic and atraumatic.  Some enlargement of the uvula. Uvula is midline. No exudate. Nasal congestion present.   Eyes: Conjunctivae and EOM are normal.  Neck: Normal range of motion. Neck supple. No tracheal deviation present.  No cervical lymphadenopathy. Good ROM of the neck. No rigidity.   Cardiovascular: Normal rate.   Pulmonary/Chest: Effort normal. No respiratory distress. He has rhonchi.  Symmetrical rise and fall of the chest. Patient speaks in complete sentences. Coarse breath sounds. Few scattered rhonchi.   Abdominal: Soft. Bowel sounds are normal.  Abdomen is soft. Good bowel sounds.  Musculoskeletal: Normal range of motion.  Lymphadenopathy:    He has no cervical adenopathy.  Neurological: He is alert and oriented to person, place, and time.  Skin: Skin is warm and dry. No rash noted.  No rash.  Psychiatric: He has a normal mood and affect. His behavior is normal.  Nursing note and vitals reviewed.   ED Course  Procedures (including critical care time)  DIAGNOSTIC STUDIES: Oxygen Saturation is 97% on RA, normal by my interpretation.    COORDINATION OF CARE: 12:25 PM - CXR results discussed with pt. Suspect influenza. Discussed treatment plan with pt at bedside which includes alternating  Tylenol and ibuprofen. Advised rest, hydration, and hygienic practices. Pt verbalized understanding and agreed to plan.   Imaging Review Dg Chest 2 View  01/22/2016  CLINICAL DATA:  Productive cough, congestion for 1 week. EXAM: CHEST  2 VIEW COMPARISON:  08/15/2015 FINDINGS: The heart size and mediastinal contours are within normal limits. Both lungs are clear. The visualized skeletal structures are unremarkable. IMPRESSION: No active cardiopulmonary disease. Electronically Signed   By: Charlett Nose M.D.   On: 01/22/2016 11:47   I have personally reviewed and evaluated these images and lab results as part of my medical decision-making.  MDM  The examination favors viral illness. As the patient is having cough productive, with a green sputum. Have discussed handwashing, using a mass, increase fluids, and use of Tylenol and ibuprofen with patient. Also discussed using Hycodan for cough and congestion. Patient is in agreement with discharge instructions.   Patient called me into his room to discuss discharge from the penis, following him being a three-day party. He states that he had intercourse with someone whom he did not know. The patient was treated in the emergency department with intramuscular Rocephin and oral doxycycline. Prescription for doxycycline was given for him to complete the treatment. Culture sent to the lab. And blood work obtained.    Final diagnoses:  None    *I personally performed the services described in this documentation, which was scribed in my presence. The recorded information has been reviewed and is accurate.*   I have reviewed nursing notes, vital signs, and all appropriate lab and imaging results for this patient.   [name], PA-C 01/22/16 1259  Marily Memos, MD 01/22/16 1515

## 2016-01-23 LAB — RPR: RPR: NONREACTIVE

## 2016-01-23 LAB — GC/CHLAMYDIA PROBE AMP (~~LOC~~) NOT AT ARMC
Chlamydia: NEGATIVE
Neisseria Gonorrhea: NEGATIVE

## 2016-01-23 LAB — HIV ANTIBODY (ROUTINE TESTING W REFLEX): HIV SCREEN 4TH GENERATION: NONREACTIVE

## 2016-12-10 ENCOUNTER — Encounter (HOSPITAL_COMMUNITY): Payer: Self-pay | Admitting: Emergency Medicine

## 2016-12-10 ENCOUNTER — Emergency Department (HOSPITAL_COMMUNITY): Payer: BLUE CROSS/BLUE SHIELD

## 2016-12-10 ENCOUNTER — Emergency Department (HOSPITAL_COMMUNITY)
Admission: EM | Admit: 2016-12-10 | Discharge: 2016-12-10 | Disposition: A | Discharge status: Home / Self Care | Payer: BLUE CROSS/BLUE SHIELD | Attending: Emergency Medicine | Admitting: Emergency Medicine

## 2016-12-10 DIAGNOSIS — J45909 Unspecified asthma, uncomplicated: Secondary | ICD-10-CM | POA: Diagnosis not present

## 2016-12-10 DIAGNOSIS — R05 Cough: Secondary | ICD-10-CM | POA: Diagnosis not present

## 2016-12-10 DIAGNOSIS — R61 Generalized hyperhidrosis: Secondary | ICD-10-CM | POA: Diagnosis not present

## 2016-12-10 DIAGNOSIS — F17210 Nicotine dependence, cigarettes, uncomplicated: Secondary | ICD-10-CM | POA: Insufficient documentation

## 2016-12-10 DIAGNOSIS — M791 Myalgia: Secondary | ICD-10-CM | POA: Diagnosis not present

## 2016-12-10 DIAGNOSIS — J029 Acute pharyngitis, unspecified: Secondary | ICD-10-CM | POA: Insufficient documentation

## 2016-12-10 DIAGNOSIS — Z79899 Other long term (current) drug therapy: Secondary | ICD-10-CM | POA: Insufficient documentation

## 2016-12-10 DIAGNOSIS — H1033 Unspecified acute conjunctivitis, bilateral: Secondary | ICD-10-CM | POA: Diagnosis not present

## 2016-12-10 DIAGNOSIS — R509 Fever, unspecified: Secondary | ICD-10-CM | POA: Diagnosis present

## 2016-12-10 DIAGNOSIS — R6889 Other general symptoms and signs: Secondary | ICD-10-CM

## 2016-12-10 DIAGNOSIS — R0981 Nasal congestion: Secondary | ICD-10-CM | POA: Insufficient documentation

## 2016-12-10 LAB — CBC WITH DIFFERENTIAL/PLATELET
BASOS ABS: 0 10*3/uL (ref 0.0–0.1)
Basophils Relative: 0 %
Eosinophils Absolute: 0 10*3/uL (ref 0.0–0.7)
Eosinophils Relative: 0 %
HCT: 33.8 % — ABNORMAL LOW (ref 39.0–52.0)
HEMOGLOBIN: 12.2 g/dL — AB (ref 13.0–17.0)
LYMPHS ABS: 1.7 10*3/uL (ref 0.7–4.0)
LYMPHS PCT: 19 %
MCH: 36.7 pg — AB (ref 26.0–34.0)
MCHC: 36.1 g/dL — ABNORMAL HIGH (ref 30.0–36.0)
MCV: 101.8 fL — AB (ref 78.0–100.0)
Monocytes Absolute: 0.8 10*3/uL (ref 0.1–1.0)
Monocytes Relative: 9 %
NEUTROS ABS: 6.2 10*3/uL (ref 1.7–7.7)
NEUTROS PCT: 72 %
Platelets: 155 10*3/uL (ref 150–400)
RBC: 3.32 MIL/uL — AB (ref 4.22–5.81)
RDW: 12.6 % (ref 11.5–15.5)
WBC: 8.7 10*3/uL (ref 4.0–10.5)

## 2016-12-10 LAB — BASIC METABOLIC PANEL
ANION GAP: 13 (ref 5–15)
BUN: 8 mg/dL (ref 6–20)
CHLORIDE: 93 mmol/L — AB (ref 101–111)
CO2: 26 mmol/L (ref 22–32)
Calcium: 8.4 mg/dL — ABNORMAL LOW (ref 8.9–10.3)
Creatinine, Ser: 0.78 mg/dL (ref 0.61–1.24)
Glucose, Bld: 70 mg/dL (ref 65–99)
POTASSIUM: 3.3 mmol/L — AB (ref 3.5–5.1)
SODIUM: 132 mmol/L — AB (ref 135–145)

## 2016-12-10 MED ORDER — TOBRAMYCIN 0.3 % OP SOLN
1.0000 [drp] | Freq: Once | OPHTHALMIC | Status: AC
  Administered 2016-12-10: 1 [drp] via OPHTHALMIC
  Filled 2016-12-10: qty 5

## 2016-12-10 MED ORDER — BENZONATATE 100 MG PO CAPS: 200.0000 mg | ORAL_CAPSULE | Freq: Three times a day (TID) | ORAL | 0 refills | Status: DC | PRN

## 2016-12-10 MED ORDER — IBUPROFEN 800 MG PO TABS
800.0000 mg | ORAL_TABLET | Freq: Once | ORAL | Status: AC
  Administered 2016-12-10: 800 mg via ORAL
  Filled 2016-12-10: qty 1

## 2016-12-10 NOTE — ED Triage Notes (Signed)
PT c/o nasal congestion, chills, body aches, fever, cough and sore throat x5 days.

## 2016-12-10 NOTE — ED Provider Notes (Signed)
[facility]-EMERGENCY DEPT Provider Note   CSN: [id number] Arrival date & time: 12/10/16  1643  By signing my name below, I, Cynda Acres, attest that this documentation has been prepared under the direction and in the presence of Burgess Amor, PA-C. Electronically Signed: Cynda Acres, Scribe. 12/10/16. 6:26 PM.   History   Chief Complaint Chief Complaint  Patient presents with  . Generalized Body Aches    HPI Comments: Donald Stewart is a 46 y.o. male who presents to the Emergency Department complaining of constant  generalized body aches that began 5 days ago. Patient has an associated fever, diaphoresis, chills, cough with green phlegm, sore throat,  nasal congestion, and diarrhea (no longer present). Patient has had exposure to the flu. Patient reports taking tylenol and Theraflu with minimal improvement. Patient describes his throat as scratchy.  Patient took his last does of tylenol at [facility]. Patient has been drinking a lot of fluids. Patient did not have a flu shot this year. Patient has not been smoking this week, usually smokes a pack a day. Patient denies any vomiting, chest pain,  or shortness of breath.   The history is provided by the patient. No language interpreter was used.    Past Medical History:  Diagnosis Date  . Asthma     Patient Active Problem List   Diagnosis Date Noted  . TOBACCO ABUSE 06/03/2008  . PNEUMONIA, LEFT 06/03/2008    History reviewed. No pertinent surgical history.     Home Medications    Prior to Admission medications   Medication Sig Start Date End Date Taking? Authorizing Provider  benzonatate (TESSALON) 100 MG capsule Take 2 capsules (200 mg total) by mouth 3 (three) times daily as needed. 12/10/16   Burgess Amor, PA-C  doxycycline (VIBRAMYCIN) 100 MG capsule Take 1 capsule (100 mg total) by mouth 2 (two) times daily. 01/22/16   [name], PA-C  HYDROcodone-homatropine (HYCODAN) 5-1.5 MG/5ML syrup Take 5 mLs by mouth every 6 (six)  hours as needed. 01/22/16   [name], PA-C  potassium chloride SA (K-DUR,KLOR-CON) 20 MEQ tablet Take 1 tablet by mouth daily. 11/25/15   Historical Provider, MD  Vitamin D, Ergocalciferol, (DRISDOL) 50000 units CAPS capsule Take 1 capsule by mouth once a week. 01/07/16   Historical Provider, MD    Family History History reviewed. No pertinent family history.  Social History Social History  Substance Use Topics  . Smoking status: Current Every Day Smoker    Packs/day: 0.50    Years: 20.00    Types: Cigarettes  . Smokeless tobacco: Never Used  . Alcohol use Yes     Comment: drinks until he passes out daily. has no desire to stop     Allergies   Patient has no known allergies.   Review of Systems Review of Systems  Constitutional: Positive for chills, diaphoresis and fever.  HENT: Positive for congestion and sore throat.   Respiratory: Positive for cough.   Gastrointestinal: Negative for abdominal pain, diarrhea, nausea and vomiting.  Musculoskeletal: Positive for myalgias.     Physical Exam Updated Vital Signs BP (!) 157/105   Pulse 66   Temp 98.3 F (36.8 C) (Oral)   Resp 18   Ht 5\' 5"  (1.651 m)   Wt 56.7 kg   SpO2 100%   BMI 20.80 kg/m   Physical Exam  Constitutional: He is oriented to person, place, and time. He appears well-developed.  Patient appears fatigued.  HENT:  Head: Normocephalic and atraumatic.  Mouth/Throat: Oropharynx  is clear and moist.  No posterior exudate or erythema.    Eyes: EOM are normal. Pupils are equal, round, and reactive to light. Right conjunctiva is injected. Left conjunctiva is injected.  Bilateral eyelash matting and thick discharge.  Neck: Normal range of motion. Neck supple.  Cardiovascular: Normal rate.    Borderline tachycardic, no murmur. No ankle edema.   Pulmonary/Chest: Effort normal and breath sounds normal. No respiratory distress. He has no wheezes.  Initial exam with bilateral rhonchi, cleared with cough. No  wheezing.   Abdominal: Soft. Bowel sounds are normal. There is no tenderness.  Musculoskeletal: Normal range of motion.  Neurological: He is alert and oriented to person, place, and time.  Skin: Skin is warm and dry.     ED Treatments / Results  DIAGNOSTIC STUDIES: Oxygen Saturation is 96% on RA, adequate by my interpretation.    COORDINATION OF CARE: 6:20 PM Discussed treatment plan with pt at bedside and pt agreed to plan, which includes blood work, chest x-ray.   Labs (all labs ordered are listed, but only abnormal results are displayed) Labs Reviewed  BASIC METABOLIC PANEL - Abnormal; Notable for the following:       Result Value   Sodium 132 (*)    Potassium 3.3 (*)    Chloride 93 (*)    Calcium 8.4 (*)    All other components within normal limits  CBC WITH DIFFERENTIAL/PLATELET - Abnormal; Notable for the following:    RBC 3.32 (*)    Hemoglobin 12.2 (*)    HCT 33.8 (*)    MCV 101.8 (*)    MCH 36.7 (*)    MCHC 36.1 (*)    All other components within normal limits    EKG  EKG Interpretation None       Radiology Dg Chest 2 View  Result Date: 12/10/2016 CLINICAL DATA:  Fever body aches and cough for 5 days. EXAM: CHEST  2 VIEW COMPARISON:  01/22/2016 FINDINGS: The heart size and mediastinal contours are within normal limits. Both lungs are clear. The visualized skeletal structures are unremarkable. IMPRESSION: No active cardiopulmonary disease. Electronically Signed   By: Ellery Plunkaniel R Mitchell M.D.   On: 12/10/2016 18:44    Procedures Procedures (including critical care time)  Medications Ordered in ED Medications  tobramycin (TOBREX) 0.3 % ophthalmic solution 1 drop (not administered)  ibuprofen (ADVIL,MOTRIN) tablet 800 mg (800 mg Oral Given 12/10/16 1841)     Initial Impression / Assessment and Plan / ED Course  I have reviewed the triage vital signs and the nursing notes.  Pertinent labs & imaging results that were available during my care of the patient  were reviewed by me and considered in my medical decision making (see chart for details).     Pt's exam c/w influenza, also with exam findings of conjunctivitis.  He was given tobrex for home use.  Rest, increased fluid intake, motrin/tylenol for fever and body aches.  No respiratory distress with normal cxr. Sx for 5 days, out of window for tamiflu effectiveness.  Advised recheck here or by pcp for any lingering or worsened sx. He tolerated PO challenge here.  The patient appears reasonably screened and/or stabilized for discharge and I doubt any other medical condition or other [facility] requiring further screening, evaluation, or treatment in the ED at this time prior to discharge.   Final Clinical Impressions(s) / ED Diagnoses   Final diagnoses:  Flu-like symptoms  Acute conjunctivitis of both eyes, unspecified acute conjunctivitis type  New Prescriptions New Prescriptions   BENZONATATE (TESSALON) 100 MG CAPSULE    Take 2 capsules (200 mg total) by mouth 3 (three) times daily as needed.   I personally performed the services described in this documentation, which was scribed in my presence. The recorded information has been reviewed and is accurate.     Burgess Amor, PA-C 12/10/16 2104    Jacalyn Lefevre, MD 12/10/16 2207

## 2016-12-10 NOTE — ED Notes (Signed)
Patient's visitor asked if patient could have a warm blanket.  Checked temp and it is 99.2; warm blanket provided.

## 2016-12-10 NOTE — Discharge Instructions (Signed)
Rest,  Drink plenty of fluids.  Take motrin or tylenol for achiness and fever reduction.  Apply 1 drop of the antibiotic given to both eyes every 4 hours for the next 5 days for your eye infection.  Avoid touching and rubbing your eyes.  Wash your hands frequently as this is contagious.   Get rechecked for increased shortness of breath,  uncontrolled fever or increasing weakness.

## 2018-01-11 ENCOUNTER — Emergency Department (HOSPITAL_COMMUNITY)
Admission: EM | Admit: 2018-01-11 | Discharge: 2018-01-11 | Disposition: A | Discharge status: Home / Self Care | Payer: BLUE CROSS/BLUE SHIELD | Attending: Emergency Medicine | Admitting: Emergency Medicine

## 2018-01-11 ENCOUNTER — Encounter (HOSPITAL_COMMUNITY): Payer: Self-pay | Admitting: *Deleted

## 2018-01-11 ENCOUNTER — Other Ambulatory Visit: Payer: Self-pay

## 2018-01-11 DIAGNOSIS — F17210 Nicotine dependence, cigarettes, uncomplicated: Secondary | ICD-10-CM | POA: Diagnosis not present

## 2018-01-11 DIAGNOSIS — Z79899 Other long term (current) drug therapy: Secondary | ICD-10-CM | POA: Diagnosis not present

## 2018-01-11 DIAGNOSIS — R040 Epistaxis: Secondary | ICD-10-CM | POA: Diagnosis not present

## 2018-01-11 DIAGNOSIS — J45909 Unspecified asthma, uncomplicated: Secondary | ICD-10-CM | POA: Insufficient documentation

## 2018-01-11 MED ORDER — OXYMETAZOLINE HCL 0.05 % NA SOLN: 2.0000 | Freq: Once | NASAL | Status: DC

## 2018-01-11 NOTE — ED Notes (Signed)
No evidence of nose bleed

## 2018-01-11 NOTE — Discharge Instructions (Signed)
No active bleeding appreciated at this time.  I suspect that your bleeding is related to your nasal congestion and upper respiratory infection.  It may also be aggravated by excessive dry ear.  Please use a humidifier in the bedroom not blowing directly on you.  Please increase fluids.  If you must blow your nose, blow it gently.  Use Afrin, 2 sprays in each nostril every 8 hours for 4 days only.  If the bleeding continues, please return to the emergency department, or see Dr. Suszanne Connerseoh for ear nose and throat evaluation and management.

## 2018-01-11 NOTE — ED Provider Notes (Signed)
[facility] Provider Note   CSN: [id number] Arrival date & time: 01/11/18  1744     History   Chief Complaint Chief Complaint  Patient presents with  . Epistaxis    HPI Donald Stewart is a 47 y.o. male.  HPI  Past Medical History:  Diagnosis Date  . Asthma     Patient Active Problem List   Diagnosis Date Noted  . TOBACCO ABUSE 06/03/2008  . PNEUMONIA, LEFT 06/03/2008    History reviewed. No pertinent surgical history.     Home Medications    Prior to Admission medications   Medication Sig Start Date End Date Taking? Authorizing Provider  potassium chloride SA (K-DUR,KLOR-CON) 20 MEQ tablet Take 1 tablet by mouth daily. 11/25/15  Yes [provider]    Family History No family history on file.  Social History Social History   Tobacco Use  . Smoking status: Current Every Day Smoker    Packs/day: 0.50    Years: 20.00    Pack years: 10.00    Types: Cigarettes  . Smokeless tobacco: Never Used  Substance Use Topics  . Alcohol use: Yes    Comment: drinks until he passes out daily. has no desire to stop  . Drug use: No     Allergies   Patient has no known allergies.   Review of Systems Review of Systems   Physical Exam Updated Vital Signs BP 126/89 (BP Location: Right Arm)   Pulse 80   Temp 98.6 F (37 C) (Oral)   Resp 16   Ht 5\' 7"  (1.702 m)   Wt 59.9 kg (132 lb)   SpO2 98%   BMI 20.67 kg/m   Physical Exam  Constitutional: He is oriented to person, place, and time. He appears well-developed and well-nourished.  Non-toxic appearance.  HENT:  Head: Normocephalic.  Right Ear: Tympanic membrane and external ear normal.  Left Ear: Tympanic membrane and external ear normal.  There is no significant tenderness to percussion over the sinuses.  Nasal congestion present. There is dried blood at the right and left nares.  There are multiple scabbed areas in the right greater than the left nares.  No active  bleeding noted at this time.  Most of the scabbed areas are in the anterior portion, do not see evidence of a posterior bleed.  No blood in the back of the throat, no clots in the back of the throat.  The airway is patent.  Eyes: EOM and lids are normal. Pupils are equal, round, and reactive to light.  Neck: Normal range of motion. Neck supple. Carotid bruit is not present.  Cardiovascular: Normal rate, regular rhythm, normal heart sounds, intact distal pulses and normal pulses.  Pulmonary/Chest: Breath sounds normal. No respiratory distress.  Abdominal: Soft. Bowel sounds are normal. There is no tenderness. There is no guarding.  Musculoskeletal: Normal range of motion.  Lymphadenopathy:       Head (right side): No submandibular adenopathy present.       Head (left side): No submandibular adenopathy present.    He has no cervical adenopathy.  Neurological: He is alert and oriented to person, place, and time. He has normal strength. No cranial nerve deficit or sensory deficit.  Skin: Skin is warm and dry.  Psychiatric: He has a normal mood and affect. His speech is normal.  Nursing note and vitals reviewed.    ED Treatments / Results  Labs (all labs ordered are listed, but only abnormal results are  displayed) Labs Reviewed - No data to display  EKG  EKG Interpretation None       Radiology No results found.  Procedures Procedures (including critical care time)  Medications Ordered in ED Medications  oxymetazoline (AFRIN) 0.05 % nasal spray 2 spray (not administered)     Initial Impression / Assessment and Plan / ED Course  I have reviewed the triage vital signs and the nursing notes.  Pertinent labs & imaging results that were available during my care of the patient were reviewed by me and considered in my medical decision making (see chart for details).       Final Clinical Impressions(s) / ED Diagnoses MDM Patient presents to the emergency department with  complaint of nosebleeds.  This problem started 2 days ago.  The patient states that he bled for nearly 2 hours on Thursday, none on Friday and states he bled a lot today.    Vital signs are stable.  Patient has scabbed areas on the right nasal mucosa compared to the left.  No active bleeding noted at this time.  Patient will use Afrin spray every 8 hours.  He will consider using a humidifier in the home.  He will return to the emergency department if the bleeding continues, or see Dr. Suszanne Conners for ear nose and throat evaluation.  Patient is in agreement with this plan.   Final diagnoses:  None    ED Discharge Orders    None       [name], [name] 01/11/18 2303    Loren Racer, MD 01/11/18 2340

## 2018-01-11 NOTE — ED Triage Notes (Signed)
Nose bleeding intermittent for 2 days

## 2018-04-09 ENCOUNTER — Encounter (HOSPITAL_COMMUNITY): Payer: Self-pay | Admitting: Emergency Medicine

## 2018-04-09 ENCOUNTER — Emergency Department (HOSPITAL_COMMUNITY): Payer: BLUE CROSS/BLUE SHIELD

## 2018-04-09 ENCOUNTER — Emergency Department (HOSPITAL_COMMUNITY)
Admission: EM | Admit: 2018-04-09 | Discharge: 2018-04-09 | Disposition: A | Discharge status: Home / Self Care | Payer: BLUE CROSS/BLUE SHIELD | Attending: Emergency Medicine | Admitting: Emergency Medicine

## 2018-04-09 ENCOUNTER — Other Ambulatory Visit: Payer: Self-pay

## 2018-04-09 DIAGNOSIS — F17210 Nicotine dependence, cigarettes, uncomplicated: Secondary | ICD-10-CM | POA: Diagnosis not present

## 2018-04-09 DIAGNOSIS — J40 Bronchitis, not specified as acute or chronic: Secondary | ICD-10-CM

## 2018-04-09 DIAGNOSIS — R05 Cough: Secondary | ICD-10-CM | POA: Diagnosis present

## 2018-04-09 DIAGNOSIS — Z79899 Other long term (current) drug therapy: Secondary | ICD-10-CM | POA: Diagnosis not present

## 2018-04-09 DIAGNOSIS — R110 Nausea: Secondary | ICD-10-CM | POA: Insufficient documentation

## 2018-04-09 DIAGNOSIS — J45901 Unspecified asthma with (acute) exacerbation: Secondary | ICD-10-CM | POA: Insufficient documentation

## 2018-04-09 LAB — URINALYSIS, ROUTINE W REFLEX MICROSCOPIC
Bilirubin Urine: NEGATIVE
Glucose, UA: 50 mg/dL — AB
Ketones, ur: 5 mg/dL — AB
Leukocytes, UA: NEGATIVE
Nitrite: NEGATIVE
Protein, ur: 30 mg/dL — AB
Specific Gravity, Urine: 1.018 (ref 1.005–1.030)
pH: 6 (ref 5.0–8.0)

## 2018-04-09 LAB — BASIC METABOLIC PANEL
Anion gap: 14 (ref 5–15)
BUN: 7 mg/dL (ref 6–20)
CO2: 31 mmol/L (ref 22–32)
Calcium: 10.3 mg/dL (ref 8.9–10.3)
Chloride: 88 mmol/L — ABNORMAL LOW (ref 101–111)
Creatinine, Ser: 0.73 mg/dL (ref 0.61–1.24)
GFR calc Af Amer: >60 mL/min (ref >60)
GFR calc non Af Amer: >60 mL/min (ref >60)
Glucose, Bld: 109 mg/dL — ABNORMAL HIGH (ref 65–99)
Potassium: 3.4 mmol/L — ABNORMAL LOW (ref 3.5–5.1)
Sodium: 133 mmol/L — ABNORMAL LOW (ref 135–145)

## 2018-04-09 LAB — CBC WITH DIFFERENTIAL/PLATELET
Basophils Absolute: 0 K/uL (ref 0.0–0.1)
Basophils Relative: 0 %
Eosinophils Absolute: 0 K/uL (ref 0.0–0.7)
Eosinophils Relative: 0 %
HCT: 41.7 % (ref 39.0–52.0)
Hemoglobin: 14.2 g/dL (ref 13.0–17.0)
Lymphocytes Relative: 12 %
Lymphs Abs: 1.4 K/uL (ref 0.7–4.0)
MCH: 34.7 pg — ABNORMAL HIGH (ref 26.0–34.0)
MCHC: 34.1 g/dL (ref 30.0–36.0)
MCV: 102 fL — ABNORMAL HIGH (ref 78.0–100.0)
Monocytes Absolute: 1.5 K/uL — ABNORMAL HIGH (ref 0.1–1.0)
Monocytes Relative: 13 %
Neutro Abs: 8.7 K/uL — ABNORMAL HIGH (ref 1.7–7.7)
Neutrophils Relative %: 75 %
Platelets: 174 K/uL (ref 150–400)
RBC: 4.09 MIL/uL — ABNORMAL LOW (ref 4.22–5.81)
RDW: 12.2 % (ref 11.5–15.5)
WBC: 11.7 K/uL — ABNORMAL HIGH (ref 4.0–10.5)

## 2018-04-09 LAB — INFLUENZA PANEL BY PCR (TYPE A & B)
Influenza A By PCR: NEGATIVE
Influenza B By PCR: NEGATIVE

## 2018-04-09 MED ORDER — IBUPROFEN 400 MG PO TABS
600.0000 mg | ORAL_TABLET | Freq: Once | ORAL | Status: AC
  Administered 2018-04-09: 600 mg via ORAL
  Filled 2018-04-09: qty 2

## 2018-04-09 MED ORDER — LEVOFLOXACIN 750 MG PO TABS
750.0000 mg | ORAL_TABLET | Freq: Once | ORAL | Status: AC
  Administered 2018-04-09: 750 mg via ORAL
  Filled 2018-04-09: qty 1

## 2018-04-09 MED ORDER — AEROCHAMBER Z-STAT PLUS/MEDIUM MISC: 1.0000 | Freq: Once | Status: DC

## 2018-04-09 MED ORDER — GUAIFENESIN-CODEINE 100-10 MG/5ML PO SOLN
5.0000 mL | Freq: Once | ORAL | Status: AC
  Administered 2018-04-09: 5 mL via ORAL
  Filled 2018-04-09: qty 5

## 2018-04-09 MED ORDER — ALBUTEROL SULFATE HFA 108 (90 BASE) MCG/ACT IN AERS
2.0000 | INHALATION_SPRAY | Freq: Once | RESPIRATORY_TRACT | Status: AC
  Administered 2018-04-09: 2 via RESPIRATORY_TRACT
  Filled 2018-04-09: qty 6.7

## 2018-04-09 MED ORDER — IPRATROPIUM-ALBUTEROL 0.5-2.5 (3) MG/3ML IN SOLN
3.0000 mL | Freq: Once | RESPIRATORY_TRACT | Status: AC
  Administered 2018-04-09: 3 mL via RESPIRATORY_TRACT
  Filled 2018-04-09: qty 3

## 2018-04-09 MED ORDER — HYDROCODONE-ACETAMINOPHEN 5-325 MG PO TABS
1.0000 | ORAL_TABLET | Freq: Once | ORAL | Status: AC
  Administered 2018-04-09: 1 via ORAL
  Filled 2018-04-09: qty 1

## 2018-04-09 MED ORDER — LEVOFLOXACIN 500 MG PO TABS: 500.0000 mg | ORAL_TABLET | Freq: Every day | ORAL | 0 refills | Status: DC

## 2018-04-09 NOTE — ED Provider Notes (Signed)
[facility] EM[facility]ENC[facility] DEPARTMENT Provider Note   CSN: [id number] Arrival date & time: 04/09/18  1113     History   Chief Complaint Chief Complaint  Patient presents with  . Fever  . Cough  . Diarrhea    HPI CASTEN FLOREN is a 47 y.o. male.  HPI  47 year old male with multiple complaints, primarily persistent cough.  Onset about 5 days ago.  Persistent since then.  Cough is productive.  No unusual leg pain or swelling.  No fevers or chills.  Mild dyspnea at times.  Denies any chest pain.  Mild nausea.  Subjective fevers.  Past Medical History:  Diagnosis Date  . Asthma     Patient Active Problem List   Diagnosis Date Noted  . TOBACCO ABUSE 06/03/2008  . PNEUMONIA, LEFT 06/03/2008    History reviewed. No pertinent surgical history.      Home Medications    Prior to Admission medications   Medication Sig Start Date End Date Taking? Authorizing Provider  potassium chloride SA (K-DUR,KLOR-CON) 20 MEQ tablet Take 1 tablet by mouth daily. 11/25/15   [provider]    Family History No family history on file.  Social History Social History   Tobacco Use  . Smoking status: Current Every Day Smoker    Packs/day: 0.50    Years: 20.00    Pack years: 10.00    Types: Cigarettes  . Smokeless tobacco: Never Used  Substance Use Topics  . Alcohol use: Yes    Comment: drinks until he passes out daily. has no desire to stop  . Drug use: No     Allergies   Patient has no known allergies.   Review of Systems Review of Systems  All systems reviewed and negative, other than as noted in HPI.  Physical Exam Updated Vital Signs BP (!) 144/91 (BP Location: Right Arm)   Pulse 73   Temp 99.1 F (37.3 C) (Oral)   Resp 16   Ht  (1.702 m)   Wt 59.9 kg (132 lb)   SpO2 98%   BMI 20.67 kg/m   Physical Exam  Constitutional: He appears well-developed and well-nourished. No distress.  HENT:  Head: Normocephalic and atraumatic.  Eyes:  Conjunctivae are normal. Right eye exhibits no discharge. Left eye exhibits no discharge.  Neck: Neck supple.  Cardiovascular: Normal rate, regular rhythm and normal heart sounds. Exam reveals no gallop and no friction rub.  No murmur heard. Pulmonary/Chest: Effort normal and breath sounds normal. No respiratory distress.  Mild expiratory wheezing bilaterally.  No increased work of breathing.  Abdominal: Soft. He exhibits no distension. There is no tenderness.  Musculoskeletal: He exhibits no edema or tenderness.  Lower extremities symmetric as compared to each other. No calf tenderness. Negative Homan's. No palpable cords.   Neurological: He is alert.  Skin: Skin is warm and dry.  Psychiatric: He has a normal mood and affect. His behavior is normal. Thought content normal.  Nursing note and vitals reviewed.    ED Treatments / Results  Labs (all labs ordered are listed, but only abnormal results are displayed) Labs Reviewed  CBC WITH DIFFERENTIAL/PLATELET - Abnormal; Notable for the following components:      Result Value   WBC 11.7 (*)    RBC 4.09 (*)    MCV 102.0 (*)    [facility] 34.7 (*) [facility])    Monocytes Absolute 1.5 (*)    All other components within normal limits  BASIC METABOLIC PANEL - Abnormal; Notable for the following components:   Sodium 133 (*)    Potassium 3.4 (*)    Chloride 88 (*)    Glucose, Bld 109 (*)    All other components within normal limits  URINALYSIS, ROUTINE W REFLEX MICROSCOPIC - Abnormal; Notable for the following components:   Glucose, UA 50 (*)    Hgb urine dipstick SMALL (*)    Ketones, ur 5 (*)    Protein, ur 30 (*)    Bacteria, UA RARE (*)    All other components within normal limits  INFLUENZA PANEL BY PCR (TYPE A & B)    EKG None  Radiology Dg Chest 2 View  Result Date: 04/09/2018 CLINICAL DATA:  47 year old male with cough congestion body ache and shortness of breath for 4 days. Smoker. EXAM: CHEST - 2 VIEW  COMPARISON:  12/10/2016 chest radiographs and earlier. FINDINGS: Stable an normal lung volumes and mediastinal contours. Visualized tracheal air column is within normal limits. No pneumothorax, pulmonary edema, pleural effusion or confluent pulmonary opacity. Mild bilateral increased interstitial markings are stable, and probably smoking related. Mild dextroconvex spinal curvature. No acute osseous abnormality identified. Negative visible bowel gas pattern. IMPRESSION: No acute cardiopulmonary abnormality. Electronically Signed   By: Odessa Fleming M.D.   On: 04/09/2018 12:53    Procedures Procedures (including critical care time)  Medications Ordered in ED Medications  levofloxacin (LEVAQUIN) tablet 750 mg (has no administration in time range)  ipratropium-albuterol (DUONEB) 0.5-2.5 (3) MG/3ML nebulizer solution 3 mL (3 mLs Nebulization Given 04/09/18 1424)  guaiFENesin-codeine 100-10 MG/5ML solution 5 mL (5 mLs Oral Given 04/09/18 1413)  ibuprofen (ADVIL,MOTRIN) tablet 600 mg (600 mg Oral Given 04/09/18 1412)  HYDROcodone-acetaminophen (NORCO/VICODIN) 5-325 MG per tablet 1 tablet (1 tablet Oral Given 04/09/18 1413)     Initial Impression / Assessment and Plan / ED Course  I have reviewed the triage vital signs and the nursing notes.  Pertinent labs & imaging results that were available during my care of the patient were reviewed by me and considered in my medical decision making (see chart for details).     Labs and imaging reviewed.  Final Clinical Impressions(s) / ED Diagnoses   Final diagnoses:  Bronchitis    ED Discharge Orders    None       Raeford Razor, MD 04/12/18 2055

## 2018-04-09 NOTE — ED Triage Notes (Signed)
Patient complains of nausea, fever, diarrhea x 5 days. He denies vomiting. Productive cough.

## 2018-06-12 ENCOUNTER — Ambulatory Visit (HOSPITAL_COMMUNITY)
Admission: EM | Admit: 2018-06-12 | Discharge: 2018-06-12 | Disposition: A | Discharge status: Home / Self Care | Payer: BLUE CROSS/BLUE SHIELD | Attending: Family Medicine | Admitting: Family Medicine

## 2018-06-12 ENCOUNTER — Encounter (HOSPITAL_COMMUNITY): Payer: Self-pay | Admitting: Emergency Medicine

## 2018-06-12 ENCOUNTER — Other Ambulatory Visit: Payer: Self-pay

## 2018-06-12 DIAGNOSIS — Z720 Tobacco use: Secondary | ICD-10-CM | POA: Diagnosis not present

## 2018-06-12 DIAGNOSIS — R059 Cough, unspecified: Secondary | ICD-10-CM

## 2018-06-12 DIAGNOSIS — R05 Cough: Secondary | ICD-10-CM

## 2018-06-12 DIAGNOSIS — J4521 Mild intermittent asthma with (acute) exacerbation: Secondary | ICD-10-CM

## 2018-06-12 MED ORDER — AZITHROMYCIN 250 MG PO TABS: 250.0000 mg | ORAL_TABLET | Freq: Every day | ORAL | 0 refills | Status: DC

## 2018-06-12 MED ORDER — IPRATROPIUM-ALBUTEROL 0.5-2.5 (3) MG/3ML IN SOLN
RESPIRATORY_TRACT | Status: AC
  Filled 2018-06-12: qty 3

## 2018-06-12 MED ORDER — IPRATROPIUM-ALBUTEROL 0.5-2.5 (3) MG/3ML IN SOLN
3.0000 mL | Freq: Once | RESPIRATORY_TRACT | Status: AC
  Administered 2018-06-12: 3 mL via RESPIRATORY_TRACT

## 2018-06-12 MED ORDER — IBUPROFEN 800 MG PO TABS
800.0000 mg | ORAL_TABLET | Freq: Once | ORAL | Status: AC
  Administered 2018-06-12: 800 mg via ORAL

## 2018-06-12 MED ORDER — PREDNISONE 20 MG PO TABS: 20.0000 mg | ORAL_TABLET | Freq: Two times a day (BID) | ORAL | 0 refills | Status: DC

## 2018-06-12 MED ORDER — IBUPROFEN 800 MG PO TABS: 800.0000 mg | ORAL_TABLET | Freq: Three times a day (TID) | ORAL | 0 refills | Status: DC

## 2018-06-12 MED ORDER — IBUPROFEN 800 MG PO TABS
ORAL_TABLET | ORAL | Status: AC
  Filled 2018-06-12: qty 1

## 2018-06-12 NOTE — ED Triage Notes (Signed)
2 day history of feeling ill.  Patient has a runny nose, general aches.  Has felt feverish, but has not checked temperature

## 2018-06-12 NOTE — ED Provider Notes (Addendum)
[facility]    CSN: [id number] Arrival date & time: 06/12/18  6578     History   Chief Complaint Chief Complaint  Patient presents with  . URI    HPI Donald Stewart is a 47 y.o. male.   HPI  Patient has underlying asthma.  Is a cigarette smoker.  He has had a cough for several days.  Getting worse.  Chest pain with deep breath and with coughing.  Decreased appetite.  Trying to drink water.  Mild runny and stuffy nose.  Is coughing up sputum.  Yellow to green.  Feels short of breath.  He is using his albuterol inhaler.  Feels short of breath in spite of this.  He is been treated for pneumonia in the past.  He feels very fatigued and feels like he may have pneumonia today.  Is here with his mother and sister.  They are taking care of him.  Past Medical History:  Diagnosis Date  . Asthma     Patient Active Problem List   Diagnosis Date Noted  . TOBACCO ABUSE 06/03/2008  . PNEUMONIA, LEFT 06/03/2008    History reviewed. No pertinent surgical history.     Home Medications    Prior to Admission medications   Medication Sig Start Date End Date Taking? Authorizing Provider  azithromycin (ZITHROMAX) 250 MG tablet Take 1 tablet (250 mg total) by mouth daily. Take first 2 tablets together, then 1 every day until finished. 06/12/18   Eustace Moore, MD  ibuprofen (ADVIL,MOTRIN) 800 MG tablet Take 1 tablet (800 mg total) by mouth 3 (three) times daily. 06/12/18   Eustace Moore, MD  predniSONE (DELTASONE) 20 MG tablet Take 1 tablet (20 mg total) by mouth 2 (two) times daily with a meal. 06/12/18   Eustace Moore, MD    Family History Family History  Problem Relation Age of Onset  . Hypertension Mother   No history of asthma, COPD, lung disease  Social History Social History   Tobacco Use  . Smoking status: Current Every Day Smoker    Packs/day: 0.50    Years: 20.00    Pack years: 10.00    Types: Cigarettes  . Smokeless tobacco: Never Used    Substance Use Topics  . Alcohol use: Yes    Comment: drinks until he passes out daily. has no desire to stop  . Drug use: No     Allergies   Patient has no known allergies.   Review of Systems Review of Systems  Constitutional: Positive for appetite change, chills, diaphoresis, fatigue and fever.  HENT: Positive for congestion. Negative for ear pain, sinus pressure, sinus pain and sore throat.   Eyes: Negative for pain, redness and visual disturbance.  Respiratory: Positive for cough, shortness of breath and wheezing.   Cardiovascular: Negative for chest pain and palpitations.  Gastrointestinal: Negative for abdominal pain, nausea and vomiting.  Genitourinary: Negative for dysuria and hematuria.  Musculoskeletal: Negative for arthralgias and back pain.  Skin: Negative for color change and rash.  Neurological: Negative for seizures and syncope.  Psychiatric/Behavioral: Positive for sleep disturbance.  All other systems reviewed and are negative.    Physical Exam Triage Vital Signs ED Triage Vitals  Enc Vitals Group     BP 06/12/18 0940 108/72     Pulse Rate 06/12/18 0940 81     Resp 06/12/18 0940 18     Temp 06/12/18 0940 99.4 F (37.4 C)     Temp Source  06/12/18 0940 Oral     SpO2 06/12/18 0940 96 %     Weight --      Height --      Head Circumference --      Peak Flow --      Pain Score 06/12/18 0938 8     Pain Loc --      Pain Edu? --      Excl. in GC? --    No data found.  Updated Vital Signs BP 108/72 (BP Location: Left Arm)   Pulse 81   Temp 99.4 F (37.4 C) (Oral)   Resp 18   SpO2 96%   Visual Acuity Right Eye Distance:   Left Eye Distance:   Bilateral Distance:    Right Eye Near:   Left Eye Near:    Bilateral Near:     Physical Exam  Constitutional: He appears well-developed and well-nourished. No distress.  HENT:  Head: Normocephalic and atraumatic.  Right Ear: External ear normal.  Left Ear: External ear normal.  Mouth/Throat:  Oropharynx is clear and moist.  Eyes: Pupils are equal, round, and reactive to light. Conjunctivae are normal.  Neck: Normal range of motion.  Cardiovascular: Normal rate and regular rhythm.  Pulmonary/Chest: Effort normal and breath sounds normal. No respiratory distress.  Wheezing throughout both lung fields.  After DuoNeb treatment he has clear lungs with a few anterior rhonchi.  No rales  Abdominal: Soft. He exhibits no distension.  Musculoskeletal: Normal range of motion. He exhibits no edema.  Lymphadenopathy:    He has no cervical adenopathy.  Neurological: He is alert.  Skin: Skin is warm and dry.  Psychiatric: He has a normal mood and affect. His behavior is normal.  Quiet demeanor.     UC Treatments / Results  Labs (all labs ordered are listed, but only abnormal results are displayed) Labs Reviewed - No data to display  EKG None  Radiology No results found.  Procedures Procedures (including critical care time)  Medications Ordered in UC Medications  ipratropium-albuterol (DUONEB) 0.5-2.5 (3) MG/3ML nebulizer solution 3 mL (3 mLs Nebulization Given 06/12/18 1005)  ibuprofen (ADVIL,MOTRIN) tablet 800 mg (800 mg Oral Given 06/12/18 1003)    Initial Impression / Assessment and Plan / UC Course  I have reviewed the triage vital signs and the nursing notes.  Pertinent labs & imaging results that were available during my care of the patient were reviewed by me and considered in my medical decision making (see chart for details).     Patient appears considerably weak.  Moderately ill.  Walks holding onto his mother for support.  Will treat with antibiotics and prednisone for the exacerbation of asthma due to respiratory infection.  This may be a virus but due to the acuity of his illness I feel an antibiotic is indicated.  Given instructions to return if worse instead of better at any time. Final Clinical Impressions(s) / UC Diagnoses   Final diagnoses:  Mild  intermittent asthma with acute exacerbation  Cough     Discharge Instructions     Take antibiotic as prescribed Take prednisone twice a day until gone May use over-the-counter cough and cold preparations Take ibuprofen 3 times a day with food for aches and pain Rest, push fluids Return if you do not see improvement in the next 2 to 3 days.  Return right away if you feel worse, have high fevers, weakness or increased chest pain    ED Prescriptions    Medication  Sig Dispense Auth. Provider   azithromycin (ZITHROMAX) 250 MG tablet Take 1 tablet (250 mg total) by mouth daily. Take first 2 tablets together, then 1 every day until finished. 6 tablet Eustace Moore, MD   predniSONE (DELTASONE) 20 MG tablet Take 1 tablet (20 mg total) by mouth 2 (two) times daily with a meal. 10 tablet Eustace Moore, MD   ibuprofen (ADVIL,MOTRIN) 800 MG tablet Take 1 tablet (800 mg total) by mouth 3 (three) times daily. 21 tablet Eustace Moore, MD     Controlled Substance Prescriptions [place] Controlled Substance Registry consulted? Not Applicable   Eustace Moore, MD 06/12/18 1030    Eustace Moore, MD 06/12/18 1031

## 2018-06-12 NOTE — Discharge Instructions (Signed)
Take antibiotic as prescribed Take prednisone twice a day until gone May use over-the-counter cough and cold preparations Take ibuprofen 3 times a day with food for aches and pain Rest, push fluids Return if you do not see improvement in the next 2 to 3 days.  Return right away if you feel worse, have high fevers, weakness or increased chest pain

## 2019-01-14 ENCOUNTER — Ambulatory Visit (HOSPITAL_COMMUNITY)
Admission: EM | Admit: 2019-01-14 | Discharge: 2019-01-14 | Disposition: A | Discharge status: Home / Self Care | Payer: BLUE CROSS/BLUE SHIELD | Attending: Family Medicine | Admitting: Family Medicine

## 2019-01-14 ENCOUNTER — Encounter (HOSPITAL_COMMUNITY): Payer: Self-pay | Admitting: Emergency Medicine

## 2019-01-14 DIAGNOSIS — M5432 Sciatica, left side: Secondary | ICD-10-CM | POA: Diagnosis not present

## 2019-01-14 HISTORY — DX: Essential (primary) hypertension: I10

## 2019-01-14 MED ORDER — PREDNISONE 10 MG (21) PO TBPK: ORAL_TABLET | ORAL | 0 refills | Status: DC

## 2019-01-14 MED ORDER — ONDANSETRON 4 MG PO TBDP: 4.0000 mg | ORAL_TABLET | Freq: Three times a day (TID) | ORAL | 0 refills | Status: DC | PRN

## 2019-01-14 MED ORDER — CYCLOBENZAPRINE HCL 10 MG PO TABS: 5.0000 mg | ORAL_TABLET | Freq: Every day | ORAL | 0 refills | Status: DC

## 2019-01-14 NOTE — ED Provider Notes (Signed)
[facility]    CSN: [id number] Arrival date & time: 01/14/19  6734     History   Chief Complaint Chief Complaint  Patient presents with  . Hip Pain    HPI Donald Stewart is a 48 y.o. male.   She has a 48 year old male that presents with left lower back, hip pain radiating down the right anterior thigh.  Describes it as sharp, stabbing symptoms been constant and remain the same since approximately 2 AM this morning after getting off work.  He denies any injuries or heavy lifting.  He has not take anything for his symptoms.  Walking and certain movements makes the pain worse.  Denies any saddle paresthesias or trouble with bowel or bladder.  Denies any dysuria, hematuria, urinary frequency. Patient does have low-grade fever here today but reports that he has had possible viral illness over the last 3 days.  He has had associated nausea, vomiting with this. This has somewhat resolved. No diarrhea.  No recent traveling or sick contacts.  ROS per HPI    Hip Pain     Past Medical History:  Diagnosis Date  . Asthma   . Hypertension     Patient Active Problem List   Diagnosis Date Noted  . TOBACCO ABUSE 06/03/2008  . PNEUMONIA, LEFT 06/03/2008    History reviewed. No pertinent surgical history.     Home Medications    Prior to Admission medications   Medication Sig Start Date End Date Taking? Authorizing Provider  amLODipine (NORVASC) 5 MG tablet TK 1 T PO QD 12/17/18   [provider]  cyclobenzaprine (FLEXERIL) 10 MG tablet Take 0.5 tablets (5 mg total) by mouth at bedtime. 01/14/19   Nadiah Corbit, Gloris Manchester A, NP  ondansetron (ZOFRAN ODT) 4 MG disintegrating tablet Take 1 tablet (4 mg total) by mouth every 8 (eight) hours as needed for nausea or vomiting. 01/14/19   Dahlia Byes A, NP  pravastatin (PRAVACHOL) 40 MG tablet TK 1 T PO  QD 12/17/18   [provider]  predniSONE (STERAPRED UNI-PAK 21 TAB) 10 MG (21) TBPK tablet 6 tabs for 1 day, then 5 tabs  for 1 das, then 4 tabs for 1 day, then 3 tabs for 1 day, 2 tabs for 1 day, then 1 tab for 1 day 01/14/19   Janace Aris, NP    Family History Family History  Problem Relation Age of Onset  . Hypertension Mother     Social History Social History   Tobacco Use  . Smoking status: Current Every Day Smoker    Packs/day: 0.50    Years: 20.00    Pack years: 10.00    Types: Cigarettes  . Smokeless tobacco: Never Used  Substance Use Topics  . Alcohol use: Yes    Comment: drinks until he passes out daily. has no desire to stop  . Drug use: No     Allergies   Patient has no known allergies.   Review of Systems Review of Systems   Physical Exam Triage Vital Signs ED Triage Vitals  Enc Vitals Group     BP 01/14/19 1010 124/80     Pulse Rate 01/14/19 1010 87     Resp 01/14/19 1010 16     Temp 01/14/19 1010 99.5 F (37.5 C)     Temp Source 01/14/19 1010 Oral     SpO2 01/14/19 1010 99 %     Weight --      Height --  Head Circumference --      Peak Flow --      Pain Score 01/14/19 1016 8     Pain Loc --      Pain Edu? --      Excl. in GC? --    No data found.  Updated Vital Signs BP 124/80 (BP Location: Left Arm)   Pulse 87   Temp 99.5 F (37.5 C) (Oral)   Resp 16   SpO2 99%   Visual Acuity Right Eye Distance:   Left Eye Distance:   Bilateral Distance:    Right Eye Near:   Left Eye Near:    Bilateral Near:     Physical Exam Vitals signs and nursing note reviewed.  Constitutional:      Appearance: Normal appearance.  HENT:     Head: Normocephalic and atraumatic.     Nose: Nose normal.     Mouth/Throat:     Pharynx: Oropharynx is clear.  Eyes:     Conjunctiva/sclera: Conjunctivae normal.  Neck:     Musculoskeletal: Normal range of motion.  Pulmonary:     Effort: Pulmonary effort is normal.  Abdominal:     Palpations: Abdomen is soft.     Tenderness: There is no abdominal tenderness.  Musculoskeletal: Normal range of motion.        General:  Tenderness present. No swelling, deformity or signs of injury.     Right lower leg: No edema.     Left lower leg: No edema.     Comments: Tenderness to the left lumbar paravertebral musculature, sciatic notch and left posterior thigh. Positive straight leg raise Good range of motion of the hip.  Hip is without swelling, erythema, deformities or bruising.  Skin:    General: Skin is warm and dry.  Neurological:     Mental Status: He is alert.  Psychiatric:        Mood and Affect: Mood normal.      UC Treatments / Results  Labs (all labs ordered are listed, but only abnormal results are displayed) Labs Reviewed - No data to display  EKG None  Radiology No results found.  Procedures Procedures (including critical care time)  Medications Ordered in UC Medications - No data to display  Initial Impression / Assessment and Plan / UC Course  I have reviewed the triage vital signs and the nursing notes.  Pertinent labs & imaging results that were available during my care of the patient were reviewed by me and considered in my medical decision making (see chart for details).    Radiculopathy of the left Symptoms consistent with left-sided radiculopathy Will treat with prednisone taper over the next 6 days  Low-dose muscle relaxer to use at bedtime Gentle stretching, heat, massage to the area  Viral illness Nausea, vomiting and low-grade fever that has been waxing and waning over the past couple days.  He feels like this is improving.  This is most likely related to some viral illness. I do not believe the low-grade fever today is related to the back or hip pain.  Zofran for nausea, vomiting as needed  Follow up as needed for continued or worsening symptoms   Final Clinical Impressions(s) / UC Diagnoses   Final diagnoses:  Sciatica of left side     Discharge Instructions     We are treating you for scitic nerve pain Prednisone taper over the next 6 days. Take this  with food.  Zofran for nausea and vomiting every 8  hours as needed Flexeril at bedtime for muscle relaxant. This will make you drowsy.  Follow up as needed for continued or worsening symptoms    ED Prescriptions    Medication Sig Dispense Auth. Provider   predniSONE (STERAPRED UNI-PAK 21 TAB) 10 MG (21) TBPK tablet 6 tabs for 1 day, then 5 tabs for 1 das, then 4 tabs for 1 day, then 3 tabs for 1 day, 2 tabs for 1 day, then 1 tab for 1 day 21 tablet Avalene Sealy A, NP   cyclobenzaprine (FLEXERIL) 10 MG tablet Take 0.5 tablets (5 mg total) by mouth at bedtime. 20 tablet Zandria Woldt A, NP   ondansetron (ZOFRAN ODT) 4 MG disintegrating tablet Take 1 tablet (4 mg total) by mouth every 8 (eight) hours as needed for nausea or vomiting. 20 tablet Dahlia Byes A, NP     Controlled Substance Prescriptions [place] Controlled Substance Registry consulted? Not Applicable   Janace Aris, NP 01/14/19 1133

## 2019-01-14 NOTE — ED Triage Notes (Signed)
Pt states hes been having L hip pain that runs down his L leg starting this morning, states it hurts worse with certain positions and certain movements.

## 2019-01-14 NOTE — Discharge Instructions (Signed)
We are treating you for scitic nerve pain Prednisone taper over the next 6 days. Take this with food.  Zofran for nausea and vomiting every 8 hours as needed Flexeril at bedtime for muscle relaxant. This will make you drowsy.  Follow up as needed for continued or worsening symptoms

## 2019-01-18 ENCOUNTER — Encounter (HOSPITAL_COMMUNITY): Payer: Self-pay | Admitting: Emergency Medicine

## 2019-01-18 ENCOUNTER — Emergency Department (HOSPITAL_COMMUNITY): Payer: BLUE CROSS/BLUE SHIELD

## 2019-01-18 ENCOUNTER — Emergency Department (HOSPITAL_COMMUNITY)
Admission: EM | Admit: 2019-01-18 | Discharge: 2019-01-19 | Disposition: A | Discharge status: Home / Self Care | Payer: BLUE CROSS/BLUE SHIELD | Attending: Emergency Medicine | Admitting: Emergency Medicine

## 2019-01-18 ENCOUNTER — Other Ambulatory Visit: Payer: Self-pay

## 2019-01-18 DIAGNOSIS — J45909 Unspecified asthma, uncomplicated: Secondary | ICD-10-CM | POA: Diagnosis not present

## 2019-01-18 DIAGNOSIS — Z79899 Other long term (current) drug therapy: Secondary | ICD-10-CM | POA: Diagnosis not present

## 2019-01-18 DIAGNOSIS — M79604 Pain in right leg: Secondary | ICD-10-CM | POA: Diagnosis present

## 2019-01-18 DIAGNOSIS — F17210 Nicotine dependence, cigarettes, uncomplicated: Secondary | ICD-10-CM | POA: Insufficient documentation

## 2019-01-18 DIAGNOSIS — I10 Essential (primary) hypertension: Secondary | ICD-10-CM | POA: Diagnosis not present

## 2019-01-18 LAB — BASIC METABOLIC PANEL
Anion gap: 11 (ref 5–15)
BUN: 11 mg/dL (ref 6–20)
CHLORIDE: 98 mmol/L (ref 98–111)
CO2: 29 mmol/L (ref 22–32)
Calcium: 8.9 mg/dL (ref 8.9–10.3)
Creatinine, Ser: 0.63 mg/dL (ref 0.61–1.24)
GFR calc Af Amer: >60 mL/min (ref >60)
GFR calc non Af Amer: >60 mL/min (ref >60)
Glucose, Bld: 117 mg/dL — ABNORMAL HIGH (ref 70–99)
POTASSIUM: 3.6 mmol/L (ref 3.5–5.1)
Sodium: 138 mmol/L (ref 135–145)

## 2019-01-18 LAB — CBC
HEMATOCRIT: 34.4 % — AB (ref 39.0–52.0)
HEMOGLOBIN: 11.2 g/dL — AB (ref 13.0–17.0)
MCH: 35.1 pg — ABNORMAL HIGH (ref 26.0–34.0)
MCHC: 32.6 g/dL (ref 30.0–36.0)
MCV: 107.8 fL — ABNORMAL HIGH (ref 80.0–100.0)
Platelets: 211 10*3/uL (ref 150–400)
RBC: 3.19 MIL/uL — AB (ref 4.22–5.81)
RDW: 12.1 % (ref 11.5–15.5)
WBC: 8.9 10*3/uL (ref 4.0–10.5)
nRBC: 1 % — ABNORMAL HIGH (ref 0.0–0.2)

## 2019-01-18 LAB — CK: CK TOTAL: 78 U/L (ref 49–397)

## 2019-01-18 MED ORDER — MELOXICAM 7.5 MG PO TABS: 7.5000 mg | ORAL_TABLET | Freq: Two times a day (BID) | ORAL | 0 refills | Status: DC | PRN

## 2019-01-18 MED ORDER — KETOROLAC TROMETHAMINE 60 MG/2ML IM SOLN
60.0000 mg | Freq: Once | INTRAMUSCULAR | Status: AC
  Administered 2019-01-18: 60 mg via INTRAMUSCULAR
  Filled 2019-01-18: qty 2

## 2019-01-18 MED ORDER — METHOCARBAMOL 500 MG PO TABS
500.0000 mg | ORAL_TABLET | Freq: Once | ORAL | Status: AC
  Administered 2019-01-18: 500 mg via ORAL
  Filled 2019-01-18: qty 1

## 2019-01-18 MED ORDER — METHOCARBAMOL 500 MG PO TABS: 500.0000 mg | ORAL_TABLET | Freq: Two times a day (BID) | ORAL | 0 refills | Status: DC | PRN

## 2019-01-18 NOTE — Discharge Instructions (Signed)
Your blood test and your x-rays show no obvious causes of your pain.  If you should develop severe or worsening pain, fever or any worsening symptoms please return to the emergency department immediately.  Otherwise follow-up with your family doctor within 2 days for a recheck.

## 2019-01-18 NOTE — ED Provider Notes (Signed)
[facility] EMERGENCY DEPARTMENT Provider Note   CSN: [id number] Arrival date & time: 01/18/19  2028    History   Chief Complaint Chief Complaint  Patient presents with  . Leg Pain  . Arm Pain    HPI Donald Stewart is a 48 y.o. male.     HPI  The patient is a 48 year old male, he has a known smoker, he has no chronic history of known back pain but states that he has had some low back pain for a while.  He has never had evaluation for this.  He reports that approximately 1 week ago he developed some left-sided pain in his leg anteriorly, this was rather acute in onset, he was seen at the urgent care and given medications including a steroid which she states is helped his pain and it is now virtually gone on the left.  Tonight while he was getting ready to take a shower he developed acute onset of pain in his right leg again from the lower back down into the right anterior thigh and all the way to his toes.  He states that his entire leg hurts from the top to the bottom, he states it is anterior, there is no pain in the calf or the hamstring area.  He denies any numbness or weakness.  He has difficulty walking because of pain but not because of weakness.  He also complains of pain in his right arm with any movement.  He denies any swelling, denies any trauma, states that he works for the [employer].  He denies any prior pathologic risk factors for back pain including IV drug use, fevers, cancers, trauma and has no urinary symptoms.  Past Medical History:  Diagnosis Date  . Asthma   . Hypertension     Patient Active Problem List   Diagnosis Date Noted  . TOBACCO ABUSE 06/03/2008  . PNEUMONIA, LEFT 06/03/2008    History reviewed. No pertinent surgical history.      Home Medications    Prior to Admission medications   Medication Sig Start Date End Date Taking? Authorizing Provider  amLODipine (NORVASC) 5 MG tablet TK 1 T PO QD 12/17/18   [provider]  cyclobenzaprine (FLEXERIL) 10 MG tablet Take 0.5 tablets (5 mg total) by mouth at bedtime. 01/14/19   Dahlia Byes A, NP  meloxicam (MOBIC) 7.5 MG tablet Take 1 tablet (7.5 mg total) by mouth 2 (two) times daily as needed for pain. 01/18/19   Eber Hong, MD  methocarbamol (ROBAXIN) 500 MG tablet Take 1 tablet (500 mg total) by mouth 2 (two) times daily as needed for muscle spasms. 01/18/19   Eber Hong, MD  ondansetron (ZOFRAN ODT) 4 MG disintegrating tablet Take 1 tablet (4 mg total) by mouth every 8 (eight) hours as needed for nausea or vomiting. 01/14/19   Dahlia Byes A, NP  pravastatin (PRAVACHOL) 40 MG tablet TK 1 T PO  QD 12/17/18   [provider]  predniSONE (STERAPRED UNI-PAK 21 TAB) 10 MG (21) TBPK tablet 6 tabs for 1 day, then 5 tabs for 1 das, then 4 tabs for 1 day, then 3 tabs for 1 day, 2 tabs for 1 day, then 1 tab for 1 day 01/14/19   Janace Aris, NP    Family History Family History  Problem Relation Age of Onset  . Hypertension Mother     Social History Social History   Tobacco Use  . Smoking status: Current Every Day  Smoker    Packs/day: 0.50    Years: 20.00    Pack years: 10.00    Types: Cigarettes  . Smokeless tobacco: Never Used  Substance Use Topics  . Alcohol use: Yes    Comment: drinks until he passes out daily. has no desire to stop  . Drug use: No     Allergies   Patient has no known allergies.   Review of Systems Review of Systems  All other systems reviewed and are negative.    Physical Exam Updated Vital Signs BP 104/67 (BP Location: Left Arm)   Pulse 88   Temp 98 F (36.7 C) (Oral)   Resp 17   Ht 1.702 m ( )   Wt 56.7 kg   SpO2 97%   BMI 19.58 kg/m   Physical Exam Vitals signs and nursing note reviewed.  Constitutional:      General: He is not in acute distress.    Appearance: He is well-developed.  HENT:     Head: Normocephalic and atraumatic.     Mouth/Throat:     Pharynx: No oropharyngeal exudate.  Eyes:      General: No scleral icterus.       Right eye: No discharge.        Left eye: No discharge.     Conjunctiva/sclera: Conjunctivae normal.     Pupils: Pupils are equal, round, and reactive to light.  Neck:     Musculoskeletal: Normal range of motion and neck supple.     Thyroid: No thyromegaly.     Vascular: No JVD.  Cardiovascular:     Rate and Rhythm: Normal rate and regular rhythm.     Heart sounds: Normal heart sounds. No murmur. No friction rub. No gallop.   Pulmonary:     Effort: Pulmonary effort is normal. No respiratory distress.     Breath sounds: Normal breath sounds. No wheezing or rales.  Abdominal:     General: Bowel sounds are normal. There is no distension.     Palpations: Abdomen is soft. There is no mass.     Tenderness: There is no abdominal tenderness.  Musculoskeletal: Normal range of motion.        General: No tenderness.     Comments: The patient has tenderness to palpation over the mid lumbar spine as well as the right lower back.  This extends into the right buttock, he also has tenderness in the right anterior thigh and with any touching of his right lower extremity at all.  There is no crepitance or subcutaneous emphysema, no asymmetry, no redness, no swelling, no color change  Lymphadenopathy:     Cervical: No cervical adenopathy.  Skin:    General: Skin is warm and dry.     Findings: No erythema or rash.  Neurological:     Mental Status: He is alert.     Coordination: Coordination normal.     Comments: Normal speech and coordination, the patient has an antalgic gait.  He is able to straight leg raise bilaterally but claims it hurts to lift the right leg, he has bilateral reflexes are preserved at the patellar tendons  Psychiatric:        Behavior: Behavior normal.      ED Treatments / Results  Labs (all labs ordered are listed, but only abnormal results are displayed) Labs Reviewed  CBC - Abnormal; Notable for the following components:      Result  Value   RBC 3.19 (*)  Hemoglobin 11.2 (*)    HCT 34.4 (*)    MCV 107.8 (*)    MCH 35.1 (*)    nRBC 1.0 (*)    All other components within normal limits  BASIC METABOLIC PANEL - Abnormal; Notable for the following components:   Glucose, Bld 117 (*)    All other components within normal limits  CK    EKG None  Radiology Dg Lumbar Spine Complete  Result Date: 01/18/2019 CLINICAL DATA:  Right leg pain. EXAM: LUMBAR SPINE - COMPLETE 4+ VIEW COMPARISON:  None. FINDINGS: Mild multilevel degenerative disc disease with small anterior osteophytes. No fracture or traumatic malalignment. No other acute abnormalities. IMPRESSION: Mild multilevel degenerative disc disease. Electronically Signed   By: Gerome Sam III M.D   On: 01/18/2019 22:41    Procedures Procedures (including critical care time)  Medications Ordered in ED Medications  ketorolac (TORADOL) injection 60 mg (60 mg Intramuscular Given 01/18/19 2321)  methocarbamol (ROBAXIN) tablet 500 mg (500 mg Oral Given 01/18/19 2322)     Initial Impression / Assessment and Plan / ED Course  I have reviewed the triage vital signs and the nursing notes.  Pertinent labs & imaging results that were available during my care of the patient were reviewed by me and considered in my medical decision making (see chart for details).  Clinical Course as of Jan 17 2342  Wynelle Link Jan 18, 2019  2340 Labs show no significant anemia or leukocytosis or abnormal metabolic panel.  CK is 78.  The patient's lumbar spine shows no signs of significant fractures or significant surgical abnormalities.  He is stable for discharge.   [BM]    Clinical Course User Index [BM] Eber Hong, MD       The patient symptoms are likely related to musculoskeletal pain.  It is strange that within the last week he has had bilateral leg pain, the left side initially came on and is now completely gone away, now it is on the right side.  There are some strange things about  the encounter including the fact that the patient is mumbling, not giving me good eye contact and giving responses that are short and nondescriptive.  The 2 family members that are here continue to tell him that it is old age.  I will obtain a lumbar x-ray, check a CK level, give him Toradol and Robaxin.  I do not see any pathologic red flags.  Final Clinical Impressions(s) / ED Diagnoses   Final diagnoses:  Right leg pain    ED Discharge Orders         Ordered    meloxicam (MOBIC) 7.5 MG tablet  2 times daily PRN     01/18/19 2342    methocarbamol (ROBAXIN) 500 MG tablet  2 times daily PRN     01/18/19 2342           Eber Hong, MD 01/18/19 2343

## 2019-01-18 NOTE — ED Notes (Signed)
ED Provider at bedside. 

## 2019-01-18 NOTE — ED Notes (Signed)
Pt presents lethargic and sleepy. Per Pts family present in the room, he has run out of Flexeril and has not been trying cold/heat therapy as advised.

## 2019-01-18 NOTE — ED Triage Notes (Signed)
Pt c/o right leg pain that is sharp at hip and runs down right leg stopping at right ankle, right arm pain that is sharp beginning at right shoulder running to right finger tips, pt was seen for same pain with less intensity to left side last week and diagnosed with sciatic nerve pain, right sided pain began today

## 2019-01-18 NOTE — ED Notes (Signed)
Pt denies any injury, surgery or strenuous activity and states this pain occurred while he was sleeping.

## 2019-08-08 ENCOUNTER — Other Ambulatory Visit: Payer: Self-pay

## 2019-08-08 ENCOUNTER — Encounter (HOSPITAL_COMMUNITY): Payer: Self-pay

## 2019-08-08 ENCOUNTER — Emergency Department (HOSPITAL_COMMUNITY)
Admission: EM | Admit: 2019-08-08 | Discharge: 2019-08-08 | Disposition: A | Discharge status: Home / Self Care | Payer: BC Managed Care – PPO | Attending: Emergency Medicine | Admitting: Emergency Medicine

## 2019-08-08 ENCOUNTER — Emergency Department (HOSPITAL_COMMUNITY): Payer: BC Managed Care – PPO

## 2019-08-08 DIAGNOSIS — I10 Essential (primary) hypertension: Secondary | ICD-10-CM | POA: Insufficient documentation

## 2019-08-08 DIAGNOSIS — J45909 Unspecified asthma, uncomplicated: Secondary | ICD-10-CM | POA: Insufficient documentation

## 2019-08-08 DIAGNOSIS — F17210 Nicotine dependence, cigarettes, uncomplicated: Secondary | ICD-10-CM | POA: Diagnosis not present

## 2019-08-08 DIAGNOSIS — Z79899 Other long term (current) drug therapy: Secondary | ICD-10-CM | POA: Insufficient documentation

## 2019-08-08 DIAGNOSIS — R0789 Other chest pain: Secondary | ICD-10-CM

## 2019-08-08 HISTORY — DX: Pure hypercholesterolemia, unspecified: E78.00

## 2019-08-08 LAB — CBC
HCT: 32.1 % — ABNORMAL LOW (ref 39.0–52.0)
Hemoglobin: 10.8 g/dL — ABNORMAL LOW (ref 13.0–17.0)
MCH: 37 pg — ABNORMAL HIGH (ref 26.0–34.0)
MCHC: 33.6 g/dL (ref 30.0–36.0)
MCV: 109.9 fL — ABNORMAL HIGH (ref 80.0–100.0)
Platelets: 122 10*3/uL — ABNORMAL LOW (ref 150–400)
RBC: 2.92 MIL/uL — ABNORMAL LOW (ref 4.22–5.81)
RDW: 13.6 % (ref 11.5–15.5)
WBC: 5.2 10*3/uL (ref 4.0–10.5)
nRBC: 0 % (ref 0.0–0.2)

## 2019-08-08 LAB — BASIC METABOLIC PANEL
Anion gap: 12 (ref 5–15)
BUN: 8 mg/dL (ref 6–20)
CO2: 31 mmol/L (ref 22–32)
Calcium: 9.5 mg/dL (ref 8.9–10.3)
Chloride: 95 mmol/L — ABNORMAL LOW (ref 98–111)
Creatinine, Ser: 0.66 mg/dL (ref 0.61–1.24)
GFR calc Af Amer: >60 mL/min (ref >60)
GFR calc non Af Amer: >60 mL/min (ref >60)
Glucose, Bld: 90 mg/dL (ref 70–99)
Potassium: 3.1 mmol/L — ABNORMAL LOW (ref 3.5–5.1)
Sodium: 138 mmol/L (ref 135–145)

## 2019-08-08 LAB — TROPONIN I (HIGH SENSITIVITY)
Troponin I (High Sensitivity): 3 ng/L (ref <18)
Troponin I (High Sensitivity): 3 ng/L (ref <18)

## 2019-08-08 MED ORDER — HYDROCODONE-ACETAMINOPHEN 5-325 MG PO TABS: 1.0000 | ORAL_TABLET | Freq: Four times a day (QID) | ORAL | 0 refills | Status: DC | PRN

## 2019-08-08 MED ORDER — HYDROCODONE-ACETAMINOPHEN 5-325 MG PO TABS
1.0000 | ORAL_TABLET | Freq: Once | ORAL | Status: AC
  Administered 2019-08-08: 1 via ORAL
  Filled 2019-08-08: qty 1

## 2019-08-08 NOTE — ED Triage Notes (Signed)
Pt c/o left sided chest pain since yesterday.  Reports pain worse with any movement and chest hurts to touch.  Denies any cough, fever, sob, or injury.  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

## 2019-08-08 NOTE — Discharge Instructions (Signed)
Work-up for the chest pain without any acute findings.  Seems to be chest wall in nature.  Take the hydrocodone as directed.  Make an appointment to follow-up with your primary care doctor.  Referral information provided to cardiology to follow-up with them.  Return for any new or worse symptoms.  Work note provided to be out of work for 2 days.

## 2019-08-08 NOTE — ED Provider Notes (Signed)
[facility] EMERGENCY DEPARTMENT Provider Note   CSN: [id number] Arrival date & time: 08/08/19  3664     History   Chief Complaint Chief Complaint  Patient presents with  . Chest Pain    HPI Donald Stewart is a 48 y.o. male.  HPI: A 48 year old patient with a history of hypertension and hypercholesterolemia presents for evaluation of chest pain. Initial onset of pain was more than 6 hours ago. The patient's chest pain is sharp and is not worse with exertion. The patient's chest pain is middle- or left-sided, is not well-localized, is not described as heaviness/pressure/tightness and does not radiate to the arms/jaw/neck. The patient does not complain of nausea and denies diaphoresis. The patient has smoked in the past 90 days. The patient has no history of stroke, has no history of peripheral artery disease, denies any history of treated diabetes, has no relevant family history of coronary artery disease (first degree relative at less than age 23) and does not have an elevated BMI (>=30).   Patient with a complaint of left-sided chest pain since yesterday evening.  Patient works nights.  Pain made worse by movement and by lifting anything.  Not made worse by taking a deep breath.  No direct trauma or injury that he knows of but he does lift heavy things at work.  Not associated with any shortness of breath nausea or vomiting diaphoresis any radiation of pain or cough or fever.  Past medical history is significant for asthma hypertension and hypercholesterol.  And is a current smoker.  Chest pain is sharp in nature.  It is tender for the patient to palpate his left anterior chest area.     Past Medical History:  Diagnosis Date  . Asthma   . Hypercholesterolemia   . Hypertension     Patient Active Problem List   Diagnosis Date Noted  . TOBACCO ABUSE 06/03/2008  . PNEUMONIA, LEFT 06/03/2008    History reviewed. No pertinent surgical history.      Home Medications     Prior to Admission medications   Medication Sig Start Date End Date Taking? Authorizing Provider  amLODipine (NORVASC) 5 MG tablet TK 1 T PO QD 12/17/18   [provider]  cyclobenzaprine (FLEXERIL) 10 MG tablet Take 0.5 tablets (5 mg total) by mouth at bedtime. 01/14/19   Loura Halt A, NP  HYDROcodone-acetaminophen (NORCO/VICODIN) 5-325 MG tablet Take 1 tablet by mouth every 6 (six) hours as needed for moderate pain. 08/08/19   Fredia Sorrow, MD  meloxicam (MOBIC) 7.5 MG tablet Take 1 tablet (7.5 mg total) by mouth 2 (two) times daily as needed for pain. 01/18/19   Noemi Chapel, MD  methocarbamol (ROBAXIN) 500 MG tablet Take 1 tablet (500 mg total) by mouth 2 (two) times daily as needed for muscle spasms. 01/18/19   Noemi Chapel, MD  ondansetron (ZOFRAN ODT) 4 MG disintegrating tablet Take 1 tablet (4 mg total) by mouth every 8 (eight) hours as needed for nausea or vomiting. 01/14/19   Loura Halt A, NP  pravastatin (PRAVACHOL) 40 MG tablet TK 1 T PO  QD 12/17/18   [provider]  predniSONE (STERAPRED UNI-PAK 21 TAB) 10 MG (21) TBPK tablet 6 tabs for 1 day, then 5 tabs for 1 das, then 4 tabs for 1 day, then 3 tabs for 1 day, 2 tabs for 1 day, then 1 tab for 1 day 01/14/19   Orvan July, NP    Family History Family History  Problem Relation Age of Onset  . Hypertension Mother     Social History Social History   Tobacco Use  . Smoking status: Current Every Day Smoker    Packs/day: 0.50    Years: 20.00    Pack years: 10.00    Types: Cigarettes  . Smokeless tobacco: Never Used  Substance Use Topics  . Alcohol use: Yes    Comment: drinks until he passes out daily. has no desire to stop  . Drug use: No     Allergies   Patient has no known allergies.   Review of Systems Review of Systems  Constitutional: Negative for chills and fever.  HENT: Negative for rhinorrhea and sore throat.   Eyes: Negative for visual disturbance.  Respiratory: Negative for cough and  shortness of breath.   Cardiovascular: Positive for chest pain. Negative for leg swelling.  Gastrointestinal: Negative for abdominal pain, diarrhea, nausea and vomiting.  Genitourinary: Negative for dysuria.  Musculoskeletal: Negative for back pain and neck pain.  Skin: Negative for rash.  Neurological: Negative for dizziness, light-headedness and headaches.  Hematological: Does not bruise/bleed easily.  Psychiatric/Behavioral: Negative for confusion.     Physical Exam Updated Vital Signs BP 117/88   Pulse 74   Temp 98.2 F (36.8 C) (Oral)   Resp 11   Wt 61.2 kg   SpO2 95%   BMI 21.14 kg/m   Physical Exam Vitals signs and nursing note reviewed.  Constitutional:      Appearance: Normal appearance. He is well-developed.  HENT:     Head: Normocephalic and atraumatic.  Eyes:     Extraocular Movements: Extraocular movements intact.     Conjunctiva/sclera: Conjunctivae normal.     Pupils: Pupils are equal, round, and reactive to light.  Neck:     Musculoskeletal: Normal range of motion and neck supple.  Cardiovascular:     Rate and Rhythm: Normal rate and regular rhythm.     Heart sounds: No murmur.  Pulmonary:     Effort: Pulmonary effort is normal. No respiratory distress.     Breath sounds: Normal breath sounds.     Comments: Tender to palpation over the left pectoralis muscle area.  Suggestive of chest wall pain. Chest:     Chest wall: Tenderness present.  Abdominal:     Palpations: Abdomen is soft.     Tenderness: There is no abdominal tenderness.  Musculoskeletal: Normal range of motion.        General: No swelling.  Skin:    General: Skin is warm and dry.  Neurological:     General: No focal deficit present.     Mental Status: He is alert and oriented to person, place, and time.      ED Treatments / Results  Labs (all labs ordered are listed, but only abnormal results are displayed) Labs Reviewed  CBC - Abnormal; Notable for the following components:       Result Value   RBC 2.92 (*)    Hemoglobin 10.8 (*)    HCT 32.1 (*)    MCV 109.9 (*)    MCH 37.0 (*)    Platelets 122 (*)    All other components within normal limits  BASIC METABOLIC PANEL - Abnormal; Notable for the following components:   Potassium 3.1 (*)    Chloride 95 (*)    All other components within normal limits  TROPONIN I (HIGH SENSITIVITY)  TROPONIN I (HIGH SENSITIVITY)    EKG EKG Interpretation  Date/Time:  Saturday August 08 2019 07:12:37 EDT Ventricular Rate:  74 PR Interval:    QRS Duration: 87 QT Interval:  401 QTC Calculation: 445 R Axis:   66 Text Interpretation:  Sinus rhythm Baseline wander in lead(s) V4 Confirmed by Vanetta Mulders [phone] on 08/08/2019 7:25:26 AM   Radiology Dg Chest 2 View  Result Date: 08/08/2019 CLINICAL DATA:  Chest pain. EXAM: CHEST - 2 VIEW COMPARISON:  Radiographs of Apr 09, 2018. FINDINGS: The heart size and mediastinal contours are within normal limits. Both lungs are clear. No pneumothorax or pleural effusion is noted. The visualized skeletal structures are unremarkable. IMPRESSION: No active cardiopulmonary disease. Electronically Signed   By: Lupita Raider M.D.   On: 08/08/2019 08:06    Procedures Procedures (including critical care time)  Medications Ordered in ED Medications  HYDROcodone-acetaminophen (NORCO/VICODIN) 5-325 MG per tablet 1 tablet (1 tablet Oral Given 08/08/19 0913)     Initial Impression / Assessment and Plan / ED Course  I have reviewed the triage vital signs and the nursing notes.  Pertinent labs & imaging results that were available during my care of the patient were reviewed by me and considered in my medical decision making (see chart for details).     HEAR Score: 3 Patient with a heart score 3.  Initial troponin less than 4.  Patient's pain is been ongoing for over 6 hours.  Patient low risk.  Also clinically seems to be chest wall pain in nature.  Will treat with pain medicine and  rest.  Will give patient referral to cardiology.  Did run through heart pathway but did not propagate into note.   Final Clinical Impressions(s) / ED Diagnoses   Final diagnoses:  Chest wall pain    ED Discharge Orders         Ordered    HYDROcodone-acetaminophen (NORCO/VICODIN) 5-325 MG tablet  Every 6 hours PRN     08/08/19 0948           Vanetta Mulders, MD 08/08/19 [phone]

## 2020-08-04 ENCOUNTER — Encounter (HOSPITAL_COMMUNITY): Payer: Self-pay

## 2020-08-04 ENCOUNTER — Emergency Department (HOSPITAL_COMMUNITY): Payer: BC Managed Care – PPO

## 2020-08-04 ENCOUNTER — Emergency Department (HOSPITAL_COMMUNITY)
Admission: EM | Admit: 2020-08-04 | Discharge: 2020-08-04 | Disposition: A | Discharge status: Home / Self Care | Payer: BC Managed Care – PPO | Attending: Emergency Medicine | Admitting: Emergency Medicine

## 2020-08-04 ENCOUNTER — Other Ambulatory Visit: Payer: Self-pay

## 2020-08-04 DIAGNOSIS — S01511A Laceration without foreign body of lip, initial encounter: Secondary | ICD-10-CM | POA: Diagnosis not present

## 2020-08-04 DIAGNOSIS — Y9289 Other specified places as the place of occurrence of the external cause: Secondary | ICD-10-CM | POA: Insufficient documentation

## 2020-08-04 DIAGNOSIS — F17210 Nicotine dependence, cigarettes, uncomplicated: Secondary | ICD-10-CM | POA: Insufficient documentation

## 2020-08-04 DIAGNOSIS — I10 Essential (primary) hypertension: Secondary | ICD-10-CM | POA: Diagnosis not present

## 2020-08-04 DIAGNOSIS — W228XXA Striking against or struck by other objects, initial encounter: Secondary | ICD-10-CM | POA: Insufficient documentation

## 2020-08-04 DIAGNOSIS — J45909 Unspecified asthma, uncomplicated: Secondary | ICD-10-CM | POA: Diagnosis not present

## 2020-08-04 DIAGNOSIS — Z23 Encounter for immunization: Secondary | ICD-10-CM | POA: Diagnosis not present

## 2020-08-04 DIAGNOSIS — R569 Unspecified convulsions: Secondary | ICD-10-CM | POA: Diagnosis present

## 2020-08-04 DIAGNOSIS — R251 Tremor, unspecified: Secondary | ICD-10-CM | POA: Diagnosis not present

## 2020-08-04 DIAGNOSIS — Z79899 Other long term (current) drug therapy: Secondary | ICD-10-CM | POA: Diagnosis not present

## 2020-08-04 LAB — CBC WITH DIFFERENTIAL/PLATELET
Abs Immature Granulocytes: 0.05 10*3/uL (ref 0.00–0.07)
Basophils Absolute: 0 10*3/uL (ref 0.0–0.1)
Basophils Relative: 0 %
Eosinophils Absolute: 0 10*3/uL (ref 0.0–0.5)
Eosinophils Relative: 0 %
HCT: 33.3 % — ABNORMAL LOW (ref 39.0–52.0)
Hemoglobin: 11.1 g/dL — ABNORMAL LOW (ref 13.0–17.0)
Immature Granulocytes: 1 %
Lymphocytes Relative: 28 %
Lymphs Abs: 2 10*3/uL (ref 0.7–4.0)
MCH: 36.5 pg — ABNORMAL HIGH (ref 26.0–34.0)
MCHC: 33.3 g/dL (ref 30.0–36.0)
MCV: 109.5 fL — ABNORMAL HIGH (ref 80.0–100.0)
Monocytes Absolute: 0.6 10*3/uL (ref 0.1–1.0)
Monocytes Relative: 8 %
Neutro Abs: 4.5 10*3/uL (ref 1.7–7.7)
Neutrophils Relative %: 63 %
Platelets: 122 10*3/uL — ABNORMAL LOW (ref 150–400)
RBC: 3.04 MIL/uL — ABNORMAL LOW (ref 4.22–5.81)
RDW: 13.2 % (ref 11.5–15.5)
WBC: 7.2 10*3/uL (ref 4.0–10.5)
nRBC: 0 % (ref 0.0–0.2)

## 2020-08-04 LAB — RAPID URINE DRUG SCREEN, HOSP PERFORMED
Amphetamines: NOT DETECTED
Barbiturates: NOT DETECTED
Benzodiazepines: NOT DETECTED
Cocaine: NOT DETECTED
Opiates: NOT DETECTED
Tetrahydrocannabinol: NOT DETECTED

## 2020-08-04 LAB — COMPREHENSIVE METABOLIC PANEL
ALT: 15 U/L (ref 0–44)
AST: 63 U/L — ABNORMAL HIGH (ref 15–41)
Albumin: 4.5 g/dL (ref 3.5–5.0)
Alkaline Phosphatase: 117 U/L (ref 38–126)
Anion gap: 16 — ABNORMAL HIGH (ref 5–15)
BUN: 7 mg/dL (ref 6–20)
CO2: 26 mmol/L (ref 22–32)
Calcium: 9.5 mg/dL (ref 8.9–10.3)
Chloride: 94 mmol/L — ABNORMAL LOW (ref 98–111)
Creatinine, Ser: 0.73 mg/dL (ref 0.61–1.24)
GFR calc Af Amer: >60 mL/min (ref >60)
GFR calc non Af Amer: >60 mL/min (ref >60)
Glucose, Bld: 122 mg/dL — ABNORMAL HIGH (ref 70–99)
Potassium: 3.3 mmol/L — ABNORMAL LOW (ref 3.5–5.1)
Sodium: 136 mmol/L (ref 135–145)
Total Bilirubin: 1 mg/dL (ref 0.3–1.2)
Total Protein: 8 g/dL (ref 6.5–8.1)

## 2020-08-04 LAB — MAGNESIUM: Magnesium: 0.9 mg/dL — CL (ref 1.7–2.4)

## 2020-08-04 LAB — CBG MONITORING, ED: Glucose-Capillary: 114 mg/dL — ABNORMAL HIGH (ref 70–99)

## 2020-08-04 MED ORDER — LORAZEPAM 2 MG/ML IJ SOLN
1.0000 mg | Freq: Once | INTRAMUSCULAR | Status: AC
  Administered 2020-08-04: 1 mg via INTRAVENOUS
  Filled 2020-08-04: qty 1

## 2020-08-04 MED ORDER — FOLIC ACID 1 MG PO TABS: 1.0000 mg | ORAL_TABLET | Freq: Every day | ORAL | 0 refills | Status: DC

## 2020-08-04 MED ORDER — CHLORDIAZEPOXIDE HCL 25 MG PO CAPS: ORAL_CAPSULE | ORAL | 0 refills | Status: DC

## 2020-08-04 MED ORDER — SODIUM CHLORIDE 0.9 % IV BOLUS
1000.0000 mL | Freq: Once | INTRAVENOUS | Status: AC
  Administered 2020-08-04: 1000 mL via INTRAVENOUS

## 2020-08-04 MED ORDER — MAGNESIUM SULFATE 2 GM/50ML IV SOLN
2.0000 g | Freq: Once | INTRAVENOUS | Status: AC
  Administered 2020-08-04: 2 g via INTRAVENOUS
  Filled 2020-08-04: qty 50

## 2020-08-04 MED ORDER — MULTIVITAMINS PO CAPS: 1.0000 | ORAL_CAPSULE | Freq: Every day | ORAL | 0 refills | Status: DC

## 2020-08-04 MED ORDER — TETANUS-DIPHTH-ACELL PERTUSSIS 5-2.5-18.5 LF-MCG/0.5 IM SUSP
0.5000 mL | Freq: Once | INTRAMUSCULAR | Status: AC
  Administered 2020-08-04: 0.5 mL via INTRAMUSCULAR
  Filled 2020-08-04: qty 0.5

## 2020-08-04 MED ORDER — SODIUM CHLORIDE 0.9 % IV SOLN: INTRAVENOUS | Status: DC

## 2020-08-04 MED ORDER — LIDOCAINE HCL (PF) 2 % IJ SOLN
INTRAMUSCULAR | Status: AC
  Administered 2020-08-04: 10 mL via INTRADERMAL
  Filled 2020-08-04: qty 40

## 2020-08-04 MED ORDER — LIDOCAINE HCL (PF) 2 % IJ SOLN: 10.0000 mL | Freq: Once | INTRAMUSCULAR | Status: AC

## 2020-08-04 NOTE — ED Notes (Signed)
Requested urine from pt, he is unable to provide me a sample at this time.

## 2020-08-04 NOTE — ED Provider Notes (Signed)
[facility] Provider Note   CSN: [id number] Arrival date & time: 08/04/20  1018     History Chief Complaint  Patient presents with  . Seizures  . Laceration    Donald Stewart is a 49 y.o. male.  HPI   Patient presented to the ED for evaluation after a seizure.  Patient was brought in by EMS.  According to the EMS report witnesses saw the patient suddenly fall to the ground and started having jerking movements.  He was noted to have a laceration on his upper lip.  In the ED the patient is awake and alert.  He denies having any history of seizures.  Patient does admit to regular daily alcohol use and last drank yesterday.  He is having pain in his lip but denies any headache.  No numbness or weakness.  No neck pain or back pain.  Patient denies any fevers chills or other complaints.  Past Medical History:  Diagnosis Date  . Asthma   . Hypercholesterolemia   . Hypertension     Patient Active Problem List   Diagnosis Date Noted  . TOBACCO ABUSE 06/03/2008  . PNEUMONIA, LEFT 06/03/2008    History reviewed. No pertinent surgical history.     Family History  Problem Relation Age of Onset  . Hypertension Mother     Social History   Tobacco Use  . Smoking status: Current Every Day Smoker    Packs/day: 0.50    Years: 20.00    Pack years: 10.00    Types: Cigarettes  . Smokeless tobacco: Never Used  Vaping Use  . Vaping Use: Never used  Substance Use Topics  . Alcohol use: Yes    Comment: drinks until he passes out daily. has no desire to stop  . Drug use: No    Home Medications Prior to Admission medications   Medication Sig Start Date End Date Taking? Authorizing Provider  amLODipine (NORVASC) 5 MG tablet TK 1 T PO QD 12/17/18   [provider]  chlordiazePOXIDE (LIBRIUM) 25 MG capsule 50mg  PO TID x 1D, then 25-50mg  PO BID X 1D, then 25-50mg  PO QD X 1D 08/04/20   Linwood DibblesKnapp, Jaylin Benzel, MD  cyclobenzaprine (FLEXERIL) 10 MG tablet Take 0.5  tablets (5 mg total) by mouth at bedtime. 01/14/19   Dahlia ByesBast, Traci A, NP  folic acid (FOLVITE) 1 MG tablet Take 1 tablet (1 mg total) by mouth daily. 08/04/20   Linwood DibblesKnapp, Edwin Baines, MD  HYDROcodone-acetaminophen (NORCO/VICODIN) 5-325 MG tablet Take 1 tablet by mouth every 6 (six) hours as needed for moderate pain. 08/08/19   Vanetta MuldersZackowski, Scott, MD  meloxicam (MOBIC) 7.5 MG tablet Take 1 tablet (7.5 mg total) by mouth 2 (two) times daily as needed for pain. 01/18/19   Eber HongMiller, Brian, MD  methocarbamol (ROBAXIN) 500 MG tablet Take 1 tablet (500 mg total) by mouth 2 (two) times daily as needed for muscle spasms. 01/18/19   Eber HongMiller, Brian, MD  Multiple Vitamin (MULTIVITAMIN) capsule Take 1 capsule by mouth daily. 08/04/20   Linwood DibblesKnapp, Tanasia Budzinski, MD  ondansetron (ZOFRAN ODT) 4 MG disintegrating tablet Take 1 tablet (4 mg total) by mouth every 8 (eight) hours as needed for nausea or vomiting. 01/14/19   Dahlia ByesBast, Traci A, NP  pravastatin (PRAVACHOL) 40 MG tablet TK 1 T PO  QD 12/17/18   [provider]  predniSONE (STERAPRED UNI-PAK 21 TAB) 10 MG (21) TBPK tablet 6 tabs for 1 day, then 5 tabs for 1 das, then 4 tabs for  1 day, then 3 tabs for 1 day, 2 tabs for 1 day, then 1 tab for 1 day 01/14/19   Janace Aris, NP    Allergies    Patient has no known allergies.  Review of Systems   Review of Systems  All other systems reviewed and are negative.   Physical Exam Updated Vital Signs BP 120/87 (BP Location: Left Arm)   Pulse 94   Temp 98.2 F (36.8 C) (Oral)   Resp 18   Ht 1.702 m (5\' 7" )   Wt 61.2 kg   SpO2 100%   BMI 21.14 kg/m   Physical Exam Vitals and nursing note reviewed.  Constitutional:      General: He is not in acute distress.    Appearance: Normal appearance. He is well-developed. He is not diaphoretic.  HENT:     Head: Normocephalic. No raccoon eyes or Battle's sign.     Comments: Laceration upper lip with gaping wound margins    Right Ear: External ear normal.     Left Ear: External ear normal.  Eyes:      General: Lids are normal.        Right eye: No discharge.     Conjunctiva/sclera:     Right eye: No hemorrhage.    Left eye: No hemorrhage. Neck:     Trachea: No tracheal deviation.  Cardiovascular:     Rate and Rhythm: Normal rate and regular rhythm.     Heart sounds: Normal heart sounds.  Pulmonary:     Effort: Pulmonary effort is normal. No respiratory distress.     Breath sounds: Normal breath sounds. No stridor.  Chest:     Chest wall: No deformity, tenderness or crepitus.  Abdominal:     General: Bowel sounds are normal. There is no distension.     Palpations: Abdomen is soft. There is no mass.     Tenderness: There is no abdominal tenderness.     Comments: Negative for seat belt sign  Musculoskeletal:     Cervical back: No swelling, edema, deformity or tenderness. No spinous process tenderness.     Thoracic back: No swelling, deformity or tenderness.     Lumbar back: No swelling or tenderness.     Comments: Pelvis stable, no ttp  Neurological:     Mental Status: He is alert.     GCS: GCS eye subscore is 4. GCS verbal subscore is 5. GCS motor subscore is 6.     Sensory: No sensory deficit.     Motor: Tremor present. No abnormal muscle tone.     Comments: Able to move all extremities, sensation intact throughout  Psychiatric:        Speech: Speech normal.        Behavior: Behavior normal.     ED Results / Procedures / Treatments   Labs (all labs ordered are listed, but only abnormal results are displayed) Labs Reviewed  CBC WITH DIFFERENTIAL/PLATELET - Abnormal; Notable for the following components:      Result Value   RBC 3.04 (*)    Hemoglobin 11.1 (*)    HCT 33.3 (*)    MCV 109.5 (*)    MCH 36.5 (*)    Platelets 122 (*)    All other components within normal limits  COMPREHENSIVE METABOLIC PANEL - Abnormal; Notable for the following components:   Potassium 3.3 (*)    Chloride 94 (*)    Glucose, Bld 122 (*)    AST 63 (*)  Anion gap 16 (*)    All other  components within normal limits  MAGNESIUM - Abnormal; Notable for the following components:   Magnesium 0.9 (*)    All other components within normal limits  CBG MONITORING, ED - Abnormal; Notable for the following components:   Glucose-Capillary 114 (*)    All other components within normal limits  RAPID URINE DRUG SCREEN, HOSP PERFORMED    EKG None  Radiology CT HEAD WO CONTRAST  Result Date: 08/04/2020 CLINICAL DATA:  Nontraumatic seizure. EXAM: CT HEAD WITHOUT CONTRAST TECHNIQUE: Contiguous axial images were obtained from the base of the skull through the vertex without intravenous contrast. COMPARISON:  October 3rd 2016 FINDINGS: Brain: No evidence of acute infarction, hemorrhage, hydrocephalus, extra-axial collection or mass lesion/mass effect. Mild chronic diffuse atrophy is identified. Vascular: No hyperdense vessel is noted. Skull: Normal. Negative for fracture or focal lesion. Sinuses/Orbits: No acute finding. Other: None. IMPRESSION: 1. No focal acute intracranial abnormality identified. 2. Mild chronic diffuse atrophy. Electronically Signed   By: Sherian Rein M.D.   On: 08/04/2020 12:11    Procedures .1-3 Lead EKG Interpretation Performed by: Linwood Dibbles, MD Authorized by: Linwood Dibbles, MD     Interpretation: normal     ECG rate:  92   ECG rate assessment: normal     Rhythm: sinus rhythm     Ectopy: none     Conduction: normal     (including critical care time)  Medications Ordered in ED Medications  sodium chloride 0.9 % bolus 1,000 mL (0 mLs Intravenous Stopped 08/04/20 1235)    And  0.9 %  sodium chloride infusion ( Intravenous New Bag/Given 08/04/20 1229)  LORazepam (ATIVAN) injection 1 mg (1 mg Intravenous Given 08/04/20 1108)  Tdap (BOOSTRIX) injection 0.5 mL (0.5 mLs Intramuscular Given 08/04/20 1109)  lidocaine HCl (PF) (XYLOCAINE) 2 % injection 10 mL (10 mLs Intradermal Given 08/04/20 1125)  magnesium sulfate IVPB 2 g 50 mL (2 g Intravenous New Bag/Given  08/04/20 1230)    ED Course  I have reviewed the triage vital signs and the nursing notes.  Pertinent labs & imaging results that were available during my care of the patient were reviewed by me and considered in my medical decision making (see chart for details).  Clinical Course as of Aug 04 1354  Thu Aug 04, 2020  1225 CT scan without acute finding   [JK]  1247 Magnesium level decreased.  Have ordered IV magnesium.   [JK]  1247 Hemoglobin is stable.  MCV is increased.  May be related to folate disposition see   [JK]  1353 Patient was monitored in the ED.  He has remained stable.  No recurrent seizures   [JK]  1353 Laceration repair by PA Uvaldo Rising   [JK]    Clinical Course User Index [JK] Linwood Dibbles, MD   MDM Rules/Calculators/A&P                          Patient presented to the ED for evaluation of a seizure.  No history of prior seizures.  Patient admits to regular alcohol use.  Labs did show a macrocytic anemia and hypomagnesemia.  I suspect this is related to his alcohol use.  No signs of DTs here.  Patient remained stable without any recurrent seizures.  I will discharge him home on a course of Librium.  Recommend outpatient follow-up with alcohol resources centers.  Also recommend outpatient follow-up with neurology.  Warning  signs and precautions discussed. Final Clinical Impression(s) / ED Diagnoses Final diagnoses:  Seizure (HCC)  Hypomagnesemia    Rx / DC Orders ED Discharge Orders         Ordered    chlordiazePOXIDE (LIBRIUM) 25 MG capsule        08/04/20 1351    folic acid (FOLVITE) 1 MG tablet  Daily        08/04/20 1352    Multiple Vitamin (MULTIVITAMIN) capsule  Daily        08/04/20 1352           Linwood Dibbles, MD 08/04/20 1355

## 2020-08-04 NOTE — Discharge Instructions (Addendum)
Avoid drinking alcohol.  Take the medications to help with alcohol withdrawal related symptoms.  Follow-up with a primary care doctor.  Also follow-up with your neurologist as we discussed.  Return to the ED for recurrent episodes.  Take the vitamin supplements as prescribed

## 2020-08-04 NOTE — ED Notes (Signed)
Date and time results received: 08/04/20 1114  Test: Magnesium Critical Value: 0.9  Name of Provider Notified: Dr. Lynelle Doctor  Orders Received? Or Actions Taken?:No new orders given.

## 2020-08-04 NOTE — ED Triage Notes (Signed)
Pt brought in by EMS due to seizure and lip laceration. Per witness pt fell and hit ground and reported to have jerking like movements. Pt has large laceration to left upper lip

## 2020-08-04 NOTE — ED Provider Notes (Signed)
..Laceration Repair  Date/Time: 08/04/2020 1:31 PM Performed by: Carroll Sage, PA-C Authorized by: Carroll Sage, PA-C   Consent:    Consent obtained:  Verbal   Consent given by:  Patient   Risks discussed:  Infection, pain, retained foreign body, need for additional repair, poor cosmetic result, tendon damage, vascular damage, poor wound healing and nerve damage   Alternatives discussed:  No treatment Anesthesia (see MAR for exact dosages):    Anesthesia method:  Local infiltration   Local anesthetic:  Lidocaine 2% w/o epi Laceration details:    Location:  Lip   Lip location:  Upper lip, full thickness   Vermilion border involved: no     Height of lip laceration:  Up to half vertical height   Length (cm):  4   Depth (mm):  2 Repair type:    Repair type:  Intermediate Pre-procedure details:    Preparation:  Patient was prepped and draped in usual sterile fashion Exploration:    Wound exploration: wound explored through full range of motion and entire depth of wound probed and visualized     Contaminated: no   Treatment:    Area cleansed with:  Saline   Amount of cleaning:  Standard   Irrigation solution:  Sterile water   Irrigation method:  Syringe   Visualized foreign bodies/material removed: no   Skin repair:    Repair method:  Sutures   Suture size:  5-0   Suture material:  Prolene   Suture technique:  Simple interrupted   Number of sutures:  4 Approximation:    Approximation:  Loose Post-procedure details:    Dressing:  Open (no dressing)   Patient tolerance of procedure:  Tolerated well, no immediate complications .Marland KitchenLaceration Repair  Date/Time: 08/04/2020 1:33 PM Performed by: Carroll Sage, PA-C Authorized by: Carroll Sage, PA-C   Consent:    Consent obtained:  Verbal   Consent given by:  Patient   Risks discussed:  Infection, pain, retained foreign body, need for additional repair, poor cosmetic result, tendon damage, vascular  damage, poor wound healing and nerve damage   Alternatives discussed:  No treatment Anesthesia (see MAR for exact dosages):    Anesthesia method:  Local infiltration   Local anesthetic:  Lidocaine 2% w/o epi Laceration details:    Location:  Lip   Lip location:  Upper lip, full thickness   Vermilion border involved: no     Height of lip laceration:  Up to half vertical height   Length (cm):  4   Depth (mm):  2 Repair type:    Repair type:  Intermediate Pre-procedure details:    Preparation:  Patient was prepped and draped in usual sterile fashion Exploration:    Wound exploration: wound explored through full range of motion and entire depth of wound probed and visualized   Treatment:    Area cleansed with:  Saline   Amount of cleaning:  Standard   Irrigation solution:  Sterile water   Irrigation method:  Syringe   Visualized foreign bodies/material removed: no   Skin repair:    Repair method:  Sutures   Suture size:  4-0   Suture material:  Fast-absorbing gut   Number of sutures:  2 Approximation:    Approximation:  Loose   Vermilion border: poorly aligned   Post-procedure details:    Patient tolerance of procedure:  Tolerated well, no immediate complications       Carroll Sage, PA-C 08/04/20 1351  Linwood Dibbles, MD 08/05/20 [phone]

## 2020-08-04 NOTE — ED Notes (Signed)
Secure pads in place

## 2020-08-04 NOTE — ED Notes (Signed)
Laceration to left upper lip noted . Bleeding controlled

## 2020-08-16 ENCOUNTER — Other Ambulatory Visit (HOSPITAL_COMMUNITY): Payer: Self-pay | Admitting: Family Medicine

## 2020-08-16 ENCOUNTER — Ambulatory Visit (HOSPITAL_COMMUNITY)
Admission: RE | Admit: 2020-08-16 | Discharge: 2020-08-16 | Disposition: A | Discharge status: Home / Self Care | Payer: BC Managed Care – PPO | Source: Ambulatory Visit | Attending: Family Medicine | Admitting: Family Medicine

## 2020-08-16 ENCOUNTER — Other Ambulatory Visit: Payer: Self-pay

## 2020-08-16 DIAGNOSIS — R059 Cough, unspecified: Secondary | ICD-10-CM

## 2020-08-22 ENCOUNTER — Other Ambulatory Visit (HOSPITAL_COMMUNITY): Payer: Self-pay | Admitting: Family Medicine

## 2020-08-22 DIAGNOSIS — R0602 Shortness of breath: Secondary | ICD-10-CM

## 2020-08-29 ENCOUNTER — Ambulatory Visit (HOSPITAL_COMMUNITY)
Admission: RE | Admit: 2020-08-29 | Discharge: 2020-08-29 | Disposition: A | Discharge status: Home / Self Care | Payer: BC Managed Care – PPO | Source: Ambulatory Visit | Attending: Family Medicine | Admitting: Family Medicine

## 2020-08-29 ENCOUNTER — Other Ambulatory Visit: Payer: Self-pay

## 2020-08-29 DIAGNOSIS — R0602 Shortness of breath: Secondary | ICD-10-CM

## 2020-09-05 ENCOUNTER — Other Ambulatory Visit (HOSPITAL_COMMUNITY): Payer: Self-pay | Admitting: Family Medicine

## 2020-09-05 DIAGNOSIS — R0602 Shortness of breath: Secondary | ICD-10-CM

## 2020-09-07 ENCOUNTER — Ambulatory Visit (HOSPITAL_COMMUNITY)
Admission: RE | Admit: 2020-09-07 | Discharge: 2020-09-07 | Disposition: A | Discharge status: Home / Self Care | Payer: BC Managed Care – PPO | Source: Ambulatory Visit | Attending: Family Medicine | Admitting: Family Medicine

## 2020-09-07 ENCOUNTER — Other Ambulatory Visit: Payer: Self-pay

## 2020-09-07 DIAGNOSIS — R0602 Shortness of breath: Secondary | ICD-10-CM | POA: Diagnosis present

## 2020-11-22 ENCOUNTER — Other Ambulatory Visit: Payer: Self-pay

## 2020-11-22 ENCOUNTER — Other Ambulatory Visit (HOSPITAL_COMMUNITY): Payer: Self-pay | Admitting: Family Medicine

## 2020-11-22 ENCOUNTER — Ambulatory Visit (HOSPITAL_COMMUNITY)
Admission: RE | Admit: 2020-11-22 | Discharge: 2020-11-22 | Disposition: A | Discharge status: Home / Self Care | Payer: BC Managed Care – PPO | Source: Ambulatory Visit | Attending: Family Medicine | Admitting: Family Medicine

## 2020-11-22 DIAGNOSIS — M160 Bilateral primary osteoarthritis of hip: Secondary | ICD-10-CM | POA: Insufficient documentation

## 2020-12-29 ENCOUNTER — Encounter: Payer: Self-pay | Admitting: Orthopaedic Surgery

## 2021-01-05 ENCOUNTER — Ambulatory Visit: Payer: BC Managed Care – PPO | Admitting: Orthopaedic Surgery

## 2021-01-05 ENCOUNTER — Encounter: Payer: Self-pay | Admitting: Orthopaedic Surgery

## 2021-01-05 ENCOUNTER — Other Ambulatory Visit: Payer: Self-pay

## 2021-01-05 ENCOUNTER — Telehealth: Payer: Self-pay | Admitting: Orthopaedic Surgery

## 2021-01-05 VITALS — BP 120/80 | HR 89 | Ht 67.0 in | Wt 117.6 lb

## 2021-01-05 DIAGNOSIS — M87051 Idiopathic aseptic necrosis of right femur: Secondary | ICD-10-CM | POA: Diagnosis not present

## 2021-01-05 NOTE — Progress Notes (Signed)
Subjective:    Patient ID: Donald Stewart, male    DOB: December 29, 1970, 50 y.o.   MRN: [id number]  HPI He has pain of the right hip.  He has pain when first standing or walking.  He has decreased motion of the right hip.  He has no trauma.  He passed out 11-22-20 and was seen in the ER.  X-rays of the hip showed: IMPRESSION: 1. Asymmetric sclerosis of the RIGHT femoral head may reflect underlying avascular necrosis. This could be further evaluated with dedicated MRI and clinically indicated.  I have independently reviewed and interpreted x-rays of this patient done at another site by another physician or qualified health professional.  I have reviewed the ER notes.  He has had problem with ETOH.  Nothing helps his hip. He is on Mobic.  I will get MRI to rule out avascular necrosis of the hip.   Review of Systems  Constitutional: Positive for activity change.       ETOH abuse  Respiratory: Positive for shortness of breath.   Musculoskeletal: Positive for arthralgias and gait problem.  All other systems reviewed and are negative.  For Review of Systems, all other systems reviewed and are negative.  The following is a summary of the past history medically, past history surgically, known current medicines, social history and family history.  This information is gathered electronically by the computer from prior information and documentation.  I review this each visit and have found including this information at this point in the chart is beneficial and informative.   Past Medical History:  Diagnosis Date  . Asthma   . Hypercholesterolemia   . Hypertension     No past surgical history on file.  Current Outpatient Medications on File Prior to Visit  Medication Sig Dispense Refill  . amLODipine (NORVASC) 5 MG tablet TK 1 T PO QD    . chlordiazePOXIDE (LIBRIUM) 25 MG capsule 50mg  PO TID x 1D, then 25-50mg  PO BID X 1D, then 25-50mg  PO QD X 1D 12 capsule 0  . cyclobenzaprine  (FLEXERIL) 10 MG tablet Take 0.5 tablets (5 mg total) by mouth at bedtime. 20 tablet 0  . folic acid (FOLVITE) 1 MG tablet Take 1 tablet (1 mg total) by mouth daily. 30 tablet 0  . meloxicam (MOBIC) 7.5 MG tablet Take 1 tablet (7.5 mg total) by mouth 2 (two) times daily as needed for pain. 15 tablet 0  . methocarbamol (ROBAXIN) 500 MG tablet Take 1 tablet (500 mg total) by mouth 2 (two) times daily as needed for muscle spasms. 20 tablet 0  . Multiple Vitamin (MULTIVITAMIN) capsule Take 1 capsule by mouth daily. 30 capsule 0  . ondansetron (ZOFRAN ODT) 4 MG disintegrating tablet Take 1 tablet (4 mg total) by mouth every 8 (eight) hours as needed for nausea or vomiting. 20 tablet 0  . pravastatin (PRAVACHOL) 40 MG tablet TK 1 T PO  QD     No current facility-administered medications on file prior to visit.    Social History   Socioeconomic History  . Marital status: Divorced    Spouse name: Not on file  . Number of children: Not on file  . Years of education: Not on file  . Highest education level: Not on file  Occupational History  . Not on file  Tobacco Use  . Smoking status: Current Every Day Smoker    Packs/day: 0.50    Years: 20.00    Pack years: 10.00  Types: Cigarettes  . Smokeless tobacco: Never Used  Vaping Use  . Vaping Use: Never used  Substance and Sexual Activity  . Alcohol use: Yes    Comment: drinks until he passes out daily. has no desire to stop  . Drug use: No  . Sexual activity: Not on file  Other Topics Concern  . Not on file  Social History Narrative  . Not on file   Social Determinants of Health   Financial Resource Strain: Not on file  Food Insecurity: Not on file  Transportation Needs: Not on file  Physical Activity: Not on file  Stress: Not on file  Social Connections: Not on file  Intimate Partner Violence: Not on file    Family History  Problem Relation Age of Onset  . Hypertension Mother     BP 120/80   Pulse 89   Ht 5\' 7"  (1.702  m)   Wt 117 lb 9.6 oz (53.3 kg)   BMI 18.42 kg/m   Body mass index is 18.42 kg/m.     Objective:   Physical Exam Vitals and nursing note reviewed. Exam conducted with a chaperone present.  Constitutional:      Appearance: He is well-developed and well-nourished.  HENT:     Head: Normocephalic and atraumatic.  Eyes:     Extraocular Movements: EOM normal.     Conjunctiva/sclera: Conjunctivae normal.     Pupils: Pupils are equal, round, and reactive to light.  Cardiovascular:     Rate and Rhythm: Normal rate and regular rhythm.     Pulses: Intact distal pulses.  Pulmonary:     Effort: Pulmonary effort is normal.  Abdominal:     Palpations: Abdomen is soft.  Musculoskeletal:     Cervical back: Normal range of motion and neck supple.       Legs:  Skin:    General: Skin is warm and dry.  Neurological:     Mental Status: He is alert and oriented to person, place, and time.     Cranial Nerves: No cranial nerve deficit.     Motor: No abnormal muscle tone.     Coordination: Coordination normal.     Deep Tendon Reflexes: Reflexes are normal and symmetric. Reflexes normal.  Psychiatric:        Mood and Affect: Mood and affect normal.        Behavior: Behavior normal.        Thought Content: Thought content normal.        Judgment: Judgment normal.    I have independently reviewed and interpreted x-rays of this patient done at another site by another physician or qualified health professional.         Assessment & Plan:   Encounter Diagnosis  Name Primary?  . Avascular necrosis of bone of right hip (HCC) Yes   I will get MRI of the right hip.  Return in two weeks.  Call if any problem.  Precautions discussed.   Electronically Signed , MD 2/24/20229:48 AM

## 2021-01-05 NOTE — Telephone Encounter (Signed)
Patient mother called stating the patient did no receive his pain medicine. It was suppose to be called in.   Please call her back at [phone]

## 2021-01-05 NOTE — Telephone Encounter (Signed)
Patient's mom called stating that the patient needed something for pain. He was under the impression that you were going to send in something.  PATIENT USES [facility] [address]

## 2021-01-09 MED ORDER — HYDROCODONE-ACETAMINOPHEN 5-325 MG PO TABS: ORAL_TABLET | ORAL | 0 refills | Status: DC

## 2021-01-26 ENCOUNTER — Ambulatory Visit: Payer: BC Managed Care – PPO | Admitting: Orthopaedic Surgery

## 2021-01-29 ENCOUNTER — Other Ambulatory Visit: Payer: Self-pay

## 2021-01-29 ENCOUNTER — Ambulatory Visit
Admission: RE | Admit: 2021-01-29 | Discharge: 2021-01-29 | Disposition: A | Discharge status: Home / Self Care | Payer: BC Managed Care – PPO | Source: Ambulatory Visit | Attending: Orthopaedic Surgery | Admitting: Orthopaedic Surgery

## 2021-01-29 DIAGNOSIS — M87051 Idiopathic aseptic necrosis of right femur: Secondary | ICD-10-CM

## 2021-02-01 ENCOUNTER — Telehealth: Payer: Self-pay | Admitting: Orthopaedic Surgery

## 2021-02-01 DIAGNOSIS — M87051 Idiopathic aseptic necrosis of right femur: Secondary | ICD-10-CM

## 2021-02-01 NOTE — Telephone Encounter (Signed)
I called the patient and let him know that we would place a new referral for the MRI at [facility]. He is aware that we will get the order placed and then obtain the authorization.

## 2021-02-01 NOTE — Telephone Encounter (Signed)
Patient states he couldn't tolerate the MRI.  He requests to go to open MRI unit.  Can we get Dr Hilda Lias to change the order to open MRI, get MRI re-approved and then let pt know if this is approved?    I told Donald Stewart that he would have to go to [facility] for this.  He was fine with it.  Thanks

## 2021-02-02 MED ORDER — DIAZEPAM 10 MG PO TABS: ORAL_TABLET | ORAL | 0 refills | Status: DC

## 2021-02-02 NOTE — Telephone Encounter (Signed)
Pt is scheduled to have procedure in larger unit at [facility]. Pt would also like to possibly get a prescription to get him through it.

## 2021-02-07 ENCOUNTER — Other Ambulatory Visit: Payer: Self-pay

## 2021-02-07 ENCOUNTER — Ambulatory Visit: Admission: RE | Admit: 2021-02-07 | Payer: BC Managed Care – PPO | Source: Ambulatory Visit

## 2021-02-07 ENCOUNTER — Telehealth: Payer: Self-pay | Admitting: Orthopaedic Surgery

## 2021-02-07 ENCOUNTER — Other Ambulatory Visit: Payer: BC Managed Care – PPO

## 2021-02-07 NOTE — Telephone Encounter (Signed)
Patient's mother called and stated that he was unable to go through with the MRI.  She asked if he could get "a pill" to help him with this.  Can the MRI be rescheduled?  I told her that we would need to speak with him regarding the MRI and any medications.  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    She asks that we call Donald Stewart back in the morning before he goes to sleep.  I told her that I would pass along the message.

## 2021-02-15 ENCOUNTER — Other Ambulatory Visit: Payer: Self-pay | Admitting: Orthopaedic Surgery

## 2021-02-15 DIAGNOSIS — M87051 Idiopathic aseptic necrosis of right femur: Secondary | ICD-10-CM

## 2021-02-17 NOTE — Telephone Encounter (Signed)
Patient has rescheduled the MRI for the 29th and wanted to know if he can have something to relax him sent in the [facility] before going for the MRI.  Thanks

## 2021-02-20 NOTE — Telephone Encounter (Signed)
Diazepam 10 mg  Qty 1 tablet to take 1 hour before MRI.  PATIENT USES [facility] [address]

## 2021-02-20 NOTE — Telephone Encounter (Signed)
I called in Valium on 3-24.  Did he have MRI and now has to redo?

## 2021-02-21 NOTE — Telephone Encounter (Signed)
Well, I did call in the Valium in late March.

## 2021-02-21 NOTE — Telephone Encounter (Signed)
No he never had it done. It was scheduled for 01/29/21, then rescheduled to 02/07/21 and now it is scheduled for 03/10/21 per [facility].

## 2021-02-23 ENCOUNTER — Telehealth: Payer: Self-pay | Admitting: Orthopaedic Surgery

## 2021-02-23 NOTE — Telephone Encounter (Signed)
Rx request 

## 2021-03-03 ENCOUNTER — Ambulatory Visit
Admission: EM | Admit: 2021-03-03 | Discharge: 2021-03-03 | Disposition: A | Discharge status: Home / Self Care | Payer: BC Managed Care – PPO | Attending: Internal Medicine | Admitting: Internal Medicine

## 2021-03-03 ENCOUNTER — Encounter: Payer: Self-pay | Admitting: Emergency Medicine

## 2021-03-03 ENCOUNTER — Other Ambulatory Visit: Payer: Self-pay

## 2021-03-03 DIAGNOSIS — T7840XA Allergy, unspecified, initial encounter: Secondary | ICD-10-CM | POA: Diagnosis not present

## 2021-03-03 DIAGNOSIS — L03114 Cellulitis of left upper limb: Secondary | ICD-10-CM | POA: Diagnosis not present

## 2021-03-03 DIAGNOSIS — W57XXXA Bitten or stung by nonvenomous insect and other nonvenomous arthropods, initial encounter: Secondary | ICD-10-CM

## 2021-03-03 DIAGNOSIS — S40862A Insect bite (nonvenomous) of left upper arm, initial encounter: Secondary | ICD-10-CM | POA: Diagnosis not present

## 2021-03-03 MED ORDER — DOXYCYCLINE HYCLATE 100 MG PO CAPS: 100.0000 mg | ORAL_CAPSULE | Freq: Two times a day (BID) | ORAL | 0 refills | Status: DC

## 2021-03-03 MED ORDER — CEFTRIAXONE SODIUM 1 G IJ SOLR
1.0000 g | Freq: Once | INTRAMUSCULAR | Status: AC
  Administered 2021-03-03: 1 g via INTRAMUSCULAR

## 2021-03-03 MED ORDER — PREDNISONE 10 MG (21) PO TBPK: ORAL_TABLET | Freq: Every day | ORAL | 0 refills | Status: DC

## 2021-03-03 NOTE — ED Provider Notes (Signed)
[facility]    CSN: [id number] Arrival date & time: 03/03/21  1126      History   Chief Complaint No chief complaint on file.   HPI Donald Stewart is a 50 y.o. male who presents with L arm swelling and redness this am. Got stung by something 2 days ago while working in the yard, but does not know what it was.    Past Medical History:  Diagnosis Date  . Asthma   . Hypercholesterolemia   . Hypertension     Patient Active Problem List   Diagnosis Date Noted  . TOBACCO ABUSE 06/03/2008  . PNEUMONIA, LEFT 06/03/2008    History reviewed. No pertinent surgical history.     Home Medications    Prior to Admission medications   Medication Sig Start Date End Date Taking? Authorizing Provider  diazepam (VALIUM) 10 MG tablet TAKE 1 TABLET ABOUT 1 HOUR BEFORE YOUR MRI. DO NOT DRIVE A CAR THE REST OF THE DAY 02/27/21   Darreld Mclean, MD  amLODipine (NORVASC) 5 MG tablet TK 1 T PO QD 12/17/18   [provider]  chlordiazePOXIDE (LIBRIUM) 25 MG capsule 50mg  PO TID x 1D, then 25-50mg  PO BID X 1D, then 25-50mg  PO QD X 1D 08/04/20   08/06/20, MD  cyclobenzaprine (FLEXERIL) 10 MG tablet Take 0.5 tablets (5 mg total) by mouth at bedtime. 01/14/19   03/16/19 A, NP  folic acid (FOLVITE) 1 MG tablet Take 1 tablet (1 mg total) by mouth daily. 08/04/20   08/06/20, MD  HYDROcodone-acetaminophen (NORCO/VICODIN) 5-325 MG tablet One tablet every four hours as needed for acute pain.  Limit of five days per [place]. 01/09/21   01/11/21, MD  meloxicam (MOBIC) 7.5 MG tablet Take 1 tablet (7.5 mg total) by mouth 2 (two) times daily as needed for pain. 01/18/19   03/20/19, MD  methocarbamol (ROBAXIN) 500 MG tablet Take 1 tablet (500 mg total) by mouth 2 (two) times daily as needed for muscle spasms. 01/18/19   03/20/19, MD  Multiple Vitamin (MULTIVITAMIN) capsule Take 1 capsule by mouth daily. 08/04/20   08/06/20, MD  ondansetron (ZOFRAN ODT) 4 MG  disintegrating tablet Take 1 tablet (4 mg total) by mouth every 8 (eight) hours as needed for nausea or vomiting. 01/14/19   03/16/19 A, NP  pravastatin (PRAVACHOL) 40 MG tablet TK 1 T PO  QD 12/17/18   [provider]    Family History Family History  Problem Relation Age of Onset  . Hypertension Mother     Social History Social History   Tobacco Use  . Smoking status: Current Every Day Smoker    Packs/day: 0.50    Years: 20.00    Pack years: 10.00    Types: Cigarettes  . Smokeless tobacco: Never Used  Vaping Use  . Vaping Use: Never used  Substance Use Topics  . Alcohol use: Yes    Comment: drinks until he passes out daily. has no desire to stop  . Drug use: No     Allergies   Patient has no known allergies.   Review of Systems Review of Systems   Physical Exam Triage Vital Signs ED Triage Vitals  Enc Vitals Group     BP 03/03/21 1139 115/74     Pulse Rate 03/03/21 1139 74     Resp 03/03/21 1139 18     Temp 03/03/21 1139 98 F (36.7 C)  Temp Source 03/03/21 1139 Oral     SpO2 03/03/21 1139 96 %     Weight --      Height --      Head Circumference --      Peak Flow --      Pain Score 03/03/21 1142 3     Pain Loc --      Pain Edu? --      Excl. in GC? --    No data found.  Updated Vital Signs BP 115/74   Pulse 74   Temp 98 F (36.7 C) (Oral)   Resp 18   SpO2 96%   Visual Acuity Right Eye Distance:   Left Eye Distance:   Bilateral Distance:    Right Eye Near:   Left Eye Near:    Bilateral Near:     Physical Exam Vitals and nursing note reviewed.  Constitutional:      Appearance: He is not ill-appearing or toxic-appearing.  HENT:     Head: Normocephalic.     Right Ear: External ear normal.     Left Ear: External ear normal.  Eyes:     General: No scleral icterus.    Conjunctiva/sclera: Conjunctivae normal.  Pulmonary:     Effort: Pulmonary effort is normal.  Chest:  Breasts:     Left: No axillary adenopathy.     Musculoskeletal:        General: Normal range of motion.  Lymphadenopathy:     Upper Body:     Left upper body: No axillary adenopathy.  Skin:    General: Skin is warm and dry.     Comments: L ARM- healing scab on lateral elbow, has redness, hotness and moderate swelling of soft tissue around it. No red streaks present from original site of infection to axilla  Neurological:     Mental Status: He is alert and oriented to person, place, and time.     Gait: Gait normal.  Psychiatric:        Thought Content: Thought content normal.        Judgment: Judgment normal.      UC Treatments / Results  Labs (all labs ordered are listed, but only abnormal results are displayed) Labs Reviewed - No data to display  EKG   Radiology No results found.  Procedures Procedures (including critical care time)  Medications Ordered in UC Medications - No data to display  Initial Impression / Assessment and Plan / UC Course  I have reviewed the triage vital signs and the nursing notes. Cellulitis L elbow/forearm secondary to insect bite with local reaction.  He was given Rocephin 1G IM and I sent rx for Doxy and prednisone pack. See instructions  Final Clinical Impressions(s) / UC Diagnoses   Final diagnoses:  None   Discharge Instructions   None    ED Prescriptions    None     PDMP not reviewed this encounter.   Garey Ham, [name] 03/07/21 1907

## 2021-03-03 NOTE — Discharge Instructions (Addendum)
Watch out that the redness does not get a streak going to your arm pit, if so you need to go to the ER for IV antibiotics. The shot we gave you here is to help fight the infection, so it does not get to your blood stream.

## 2021-03-03 NOTE — ED Triage Notes (Signed)
Thinks he got a spider bite on his left arm on Wednesday.  Arm is swollen and red and area is warm to the touch.  States area itches.

## 2021-03-09 ENCOUNTER — Ambulatory Visit
Admission: RE | Admit: 2021-03-09 | Discharge: 2021-03-09 | Disposition: A | Discharge status: Home / Self Care | Payer: BC Managed Care – PPO | Source: Ambulatory Visit | Attending: Orthopaedic Surgery | Admitting: Orthopaedic Surgery

## 2021-03-09 DIAGNOSIS — M87051 Idiopathic aseptic necrosis of right femur: Secondary | ICD-10-CM

## 2021-03-09 NOTE — Telephone Encounter (Signed)
Patient came by office with his mother and stated that he went today and couldn't stay in there to have MRI. He thinks it would be better if he went to the hospital and be put to sleep so it can be done.   Please advise

## 2021-03-10 ENCOUNTER — Other Ambulatory Visit: Payer: BC Managed Care – PPO

## 2021-03-13 NOTE — Telephone Encounter (Signed)
Schedule at open unit and I can give Valium prior.

## 2021-03-24 ENCOUNTER — Telehealth: Payer: Self-pay | Admitting: Orthopaedic Surgery

## 2021-03-24 NOTE — Telephone Encounter (Signed)
Patient mother called wanting to know if the open MRI has been approved.   Please call her back at [phone].  She states someone was suppose to call her back and didn't.

## 2021-03-24 NOTE — Telephone Encounter (Signed)
I called [facility] and they said patient was scheduled for MRI, could not do it, claustrophobic.  I was rescheduled and then cancelled by provider, and then rescheduled again with medication and patient cancelled that one.  I called number below, and it rang, NA.  Will try to call patient/mother back and see how they want to proceed.

## 2021-03-24 NOTE — Telephone Encounter (Signed)
Donald Stewart is going to check about this for me.

## 2021-03-27 ENCOUNTER — Other Ambulatory Visit: Payer: Self-pay | Admitting: Orthopaedic Surgery

## 2021-03-27 DIAGNOSIS — M87051 Idiopathic aseptic necrosis of right femur: Secondary | ICD-10-CM

## 2021-03-27 NOTE — Telephone Encounter (Signed)
Patient is questioning about approval for the MRI.  Could you please give him a call?  Thanks

## 2021-03-27 NOTE — Telephone Encounter (Signed)
I called and spoke with Windell Hummingbird and she is going to give [facility] a call tomorrow if they don't call first. She is to give [place] a call back with appointment date and time. I have to let Dr. Hilda Lias know so he can send in something to take before the MRI.

## 2021-04-06 NOTE — Telephone Encounter (Signed)
He was scheduled with [facility] and they said he did not show. I called and spoke with Joyce(on his DPR) before finding out he didn't show for his appointment and she said he had said something about going see another doctor. You can never speak with him, either in bed asleep because he works 3rd shift or no answer.

## 2021-06-08 ENCOUNTER — Ambulatory Visit: Payer: BC Managed Care – PPO | Admitting: Internal Medicine

## 2021-06-08 ENCOUNTER — Encounter: Payer: Self-pay | Admitting: Internal Medicine

## 2021-06-08 ENCOUNTER — Other Ambulatory Visit: Payer: Self-pay

## 2021-06-08 VITALS — BP 108/72 | HR 91 | Temp 98.3°F | Resp 18 | Ht 67.0 in | Wt 124.1 lb

## 2021-06-08 DIAGNOSIS — M87051 Idiopathic aseptic necrosis of right femur: Secondary | ICD-10-CM

## 2021-06-08 DIAGNOSIS — I10 Essential (primary) hypertension: Secondary | ICD-10-CM | POA: Insufficient documentation

## 2021-06-08 DIAGNOSIS — Z01818 Encounter for other preprocedural examination: Secondary | ICD-10-CM

## 2021-06-08 DIAGNOSIS — Z1159 Encounter for screening for other viral diseases: Secondary | ICD-10-CM

## 2021-06-08 DIAGNOSIS — E782 Mixed hyperlipidemia: Secondary | ICD-10-CM | POA: Insufficient documentation

## 2021-06-08 DIAGNOSIS — Z7689 Persons encountering health services in other specified circumstances: Secondary | ICD-10-CM | POA: Diagnosis not present

## 2021-06-08 DIAGNOSIS — Z131 Encounter for screening for diabetes mellitus: Secondary | ICD-10-CM

## 2021-06-08 HISTORY — DX: Idiopathic aseptic necrosis of right femur: M87.051

## 2021-06-08 NOTE — Patient Instructions (Addendum)
Please continue taking medications as prescribed.  Please refrain from smoking and alcohol abuse.  Please continue to follow low salt and low cholesterol diet.

## 2021-06-08 NOTE — Assessment & Plan Note (Signed)
Check CBC, CMP and HbA1C EKG: Sinus rhythm. HR 77. No signs of acute ischemia. Probable right atrial enlargement.

## 2021-06-08 NOTE — Progress Notes (Signed)
New Patient Office Visit  Subjective:  Patient ID: Donald Stewart, male    DOB: Aug 14, 1971  Age: 50 y.o. MRN: [id number]  CC:  Chief Complaint  Patient presents with   New Patient (Initial Visit)    New patient needs surgery clarence for hip surgery     HPI Donald Stewart is a 50 year old male with PMH of HTN, HLD, alcohol and tobacco abuse and avascular necrosis of right hip who presents for establishing care.  HTN: BP is well-controlled. Takes medications regularly. Patient denies headache, dizziness, chest pain, dyspnea or palpitations.  He is planned to have right THA for avascular necrosis of right hip. He has quit alcohol and smoking recently. He smoked about 0.5 pack/day for about 20 years.  He has had 2 doses of COVID vaccine.  Past Medical History:  Diagnosis Date   Asthma    Hypercholesterolemia    Hypertension     History reviewed. No pertinent surgical history.  Family History  Problem Relation Age of Onset   Hypertension Mother     Social History   Socioeconomic History   Marital status: Divorced    Spouse name: Not on file   Number of children: Not on file   Years of education: Not on file   Highest education level: Not on file  Occupational History   Not on file  Tobacco Use   Smoking status: Former    Packs/day: 0.50    Years: 20.00    Pack years: 10.00    Types: Cigarettes   Smokeless tobacco: Never  Vaping Use   Vaping Use: Never used  Substance and Sexual Activity   Alcohol use: Yes    Comment: drinks until he passes out daily. has no desire to stop   Drug use: No   Sexual activity: Not on file  Other Topics Concern   Not on file  Social History Narrative   Not on file   Social Determinants of Health   Financial Resource Strain: Not on file  Food Insecurity: Not on file  Transportation Needs: Not on file  Physical Activity: Not on file  Stress: Not on file  Social Connections: Not on file  Intimate Partner  Violence: Not on file    ROS Review of Systems  Constitutional:  Negative for chills and fever.  HENT:  Negative for congestion and sore throat.   Eyes:  Negative for pain and discharge.  Respiratory:  Negative for cough and shortness of breath.   Cardiovascular:  Negative for chest pain and palpitations.  Gastrointestinal:  Negative for constipation, diarrhea, nausea and vomiting.  Endocrine: Negative for polydipsia and polyuria.  Genitourinary:  Negative for dysuria and hematuria.  Musculoskeletal:  Positive for arthralgias and joint swelling. Negative for neck pain and neck stiffness.  Skin:  Negative for rash.  Neurological:  Negative for dizziness, weakness, numbness and headaches.  Psychiatric/Behavioral:  Negative for agitation and behavioral problems.    Objective:   Today's Vitals: BP 108/72 (BP Location: Left Arm, Patient Position: Sitting, Cuff Size: Normal)   Pulse 91   Temp 98.3 F (36.8 C) (Oral)   Resp 18   Ht $R'5\' 7"'Yu$  (1.702 m)   Wt 124 lb 1.9 oz (56.3 kg)   SpO2 98%   BMI 19.44 kg/m   Physical Exam Vitals reviewed.  Constitutional:      General: He is not in acute distress.    Appearance: He is not diaphoretic.  HENT:     Head: Normocephalic  and atraumatic.     Nose: Nose normal.     Mouth/Throat:     Mouth: Mucous membranes are moist.  Eyes:     General: No scleral icterus.    Extraocular Movements: Extraocular movements intact.  Cardiovascular:     Rate and Rhythm: Normal rate and regular rhythm.     Pulses: Normal pulses.     Heart sounds: Normal heart sounds. No murmur heard. Pulmonary:     Breath sounds: Normal breath sounds. No wheezing or rales.  Abdominal:     Palpations: Abdomen is soft.     Tenderness: There is no abdominal tenderness.  Musculoskeletal:     Cervical back: Neck supple. No tenderness.     Right lower leg: No edema.     Left lower leg: No edema.  Skin:    General: Skin is warm.     Findings: No rash.  Neurological:      General: No focal deficit present.     Mental Status: He is alert and oriented to person, place, and time.  Psychiatric:        Mood and Affect: Mood normal.        Behavior: Behavior normal.    Assessment & Plan:   Problem List Items Addressed This Visit       Encounter to establish care - Primary   Care established Previous chart reviewed History and medications reviewed with the patient      Relevant Orders  CMP14+EGFR  CBC with Differential/Platelet  Lipid panel  TSH  Vitamin D (25 hydroxy)    Cardiovascular and Mediastinum   Primary hypertension    BP Readings from Last 1 Encounters:  06/08/21 108/72  Well-controlled Counseled for compliance with the medications Advised DASH diet and moderate exercise/walking, at least 150 mins/week         Musculoskeletal and Integument   Avascular necrosis of hip, right (HCC)   Relevant Orders   Vitamin D (25 hydroxy)     Other      Mixed hyperlipidemia    On Pravastatin Check lipid profile       Preop examination    Check CBC, CMP and HbA1C EKG: Sinus rhythm. HR 77. No signs of acute ischemia. Probable right atrial enlargement.       Other Visit Diagnoses     Need for hepatitis C screening test       Relevant Orders   Hepatitis C Antibody   Screening for diabetes mellitus (DM)       Relevant Orders   Hemoglobin A1c       Outpatient Encounter Medications as of 06/08/2021  Medication Sig   amLODipine (NORVASC) 5 MG tablet TK 1 T PO QD   meloxicam (MOBIC) 7.5 MG tablet Take 1 tablet (7.5 mg total) by mouth 2 (two) times daily as needed for pain.   pravastatin (PRAVACHOL) 40 MG tablet TK 1 T PO  QD   [DISCONTINUED] chlordiazePOXIDE (LIBRIUM) 25 MG capsule 27m PO TID x 1D, then 25-522mPO BID X 1D, then 25-5069mO QD X 1D (Patient not taking: Reported on 06/08/2021)   [DISCONTINUED] cyclobenzaprine (FLEXERIL) 10 MG tablet Take 0.5 tablets (5 mg total) by mouth at bedtime. (Patient not taking: Reported on  06/08/2021)   [DISCONTINUED] diazepam (VALIUM) 10 MG tablet TAKE 1 TABLET ABOUT 1 HOUR BEFORE YOUR MRI. DO NOT DRIVE A CAR THE REST OF THE DAY (Patient not taking: Reported on 06/08/2021)   [DISCONTINUED] doxycycline (VIBRAMYCIN) 100 MG capsule Take  1 capsule (100 mg total) by mouth 2 (two) times daily. (Patient not taking: Reported on 06/08/2021)   [DISCONTINUED] folic acid (FOLVITE) 1 MG tablet Take 1 tablet (1 mg total) by mouth daily. (Patient not taking: Reported on 06/08/2021)   [DISCONTINUED] HYDROcodone-acetaminophen (NORCO/VICODIN) 5-325 MG tablet One tablet every four hours as needed for acute pain.  Limit of five days per [place]. (Patient not taking: Reported on 06/08/2021)   [DISCONTINUED] methocarbamol (ROBAXIN) 500 MG tablet Take 1 tablet (500 mg total) by mouth 2 (two) times daily as needed for muscle spasms. (Patient not taking: Reported on 06/08/2021)   [DISCONTINUED] Multiple Vitamin (MULTIVITAMIN) capsule Take 1 capsule by mouth daily. (Patient not taking: Reported on 06/08/2021)   [DISCONTINUED] ondansetron (ZOFRAN ODT) 4 MG disintegrating tablet Take 1 tablet (4 mg total) by mouth every 8 (eight) hours as needed for nausea or vomiting. (Patient not taking: Reported on 06/08/2021)   [DISCONTINUED] predniSONE (STERAPRED UNI-PAK 21 TAB) 10 MG (21) TBPK tablet Take by mouth daily. Take 6 tabs by mouth daily  for 2 days, then 5 tabs for 2 days, then 4 tabs for 2 days, then 3 tabs for 2 days, 2 tabs for 2 days, then 1 tab by mouth daily for 2 days (Patient not taking: Reported on 06/08/2021)   No facility-administered encounter medications on file as of 06/08/2021.    Follow-up: Return in about 5 months (around 11/08/2021) for Annual physical.   Lindell Spar, MD

## 2021-06-08 NOTE — Assessment & Plan Note (Signed)
On Pravastatin Check lipid profile 

## 2021-06-08 NOTE — Assessment & Plan Note (Signed)
Care established Previous chart reviewed History and medications reviewed with the patient 

## 2021-06-08 NOTE — Assessment & Plan Note (Signed)
BP Readings from Last 1 Encounters:  06/08/21 108/72   Well-controlled Counseled for compliance with the medications Advised DASH diet and moderate exercise/walking, at least 150 mins/week

## 2021-06-09 ENCOUNTER — Ambulatory Visit: Payer: BC Managed Care – PPO | Admitting: Internal Medicine

## 2021-06-09 ENCOUNTER — Other Ambulatory Visit: Payer: Self-pay

## 2021-06-09 LAB — CBC WITH DIFFERENTIAL/PLATELET
Basophils Absolute: 0 10*3/uL (ref 0.0–0.2)
Basos: 1 %
EOS (ABSOLUTE): 0.1 10*3/uL (ref 0.0–0.4)
Eos: 2 %
Hematocrit: 36.6 % — ABNORMAL LOW (ref 37.5–51.0)
Hemoglobin: 12.7 g/dL — ABNORMAL LOW (ref 13.0–17.7)
Immature Grans (Abs): 0 10*3/uL (ref 0.0–0.1)
Immature Granulocytes: 0 %
Lymphocytes Absolute: 2 10*3/uL (ref 0.7–3.1)
Lymphs: 32 %
MCH: 33.8 pg — ABNORMAL HIGH (ref 26.6–33.0)
MCHC: 34.7 g/dL (ref 31.5–35.7)
MCV: 97 fL (ref 79–97)
Monocytes Absolute: 0.7 10*3/uL (ref 0.1–0.9)
Monocytes: 11 %
Neutrophils Absolute: 3.4 10*3/uL (ref 1.4–7.0)
Neutrophils: 54 %
Platelets: 237 10*3/uL (ref 150–450)
RBC: 3.76 x10E6/uL — ABNORMAL LOW (ref 4.14–5.80)
RDW: 11.4 % — ABNORMAL LOW (ref 11.6–15.4)
WBC: 6.2 10*3/uL (ref 3.4–10.8)

## 2021-06-09 LAB — CMP14+EGFR
ALT: 12 IU/L (ref 0–44)
AST: 24 IU/L (ref 0–40)
Albumin/Globulin Ratio: 1.5 (ref 1.2–2.2)
Albumin: 4.5 g/dL (ref 4.0–5.0)
Alkaline Phosphatase: 93 IU/L (ref 44–121)
BUN/Creatinine Ratio: 14 (ref 9–20)
BUN: 12 mg/dL (ref 6–24)
Bilirubin Total: 0.4 mg/dL (ref 0.0–1.2)
CO2: 23 mmol/L (ref 20–29)
Calcium: 10.3 mg/dL — ABNORMAL HIGH (ref 8.7–10.2)
Chloride: 97 mmol/L (ref 96–106)
Creatinine, Ser: 0.84 mg/dL (ref 0.76–1.27)
Globulin, Total: 3.1 g/dL (ref 1.5–4.5)
Glucose: 85 mg/dL (ref 65–99)
Potassium: 4.2 mmol/L (ref 3.5–5.2)
Sodium: 142 mmol/L (ref 134–144)
Total Protein: 7.6 g/dL (ref 6.0–8.5)
eGFR: 106 mL/min/{1.73_m2} (ref >59)

## 2021-06-09 LAB — LIPID PANEL
Chol/HDL Ratio: 4.5 ratio (ref 0.0–5.0)
Cholesterol, Total: 199 mg/dL (ref 100–199)
HDL: 44 mg/dL (ref >39)
LDL Chol Calc (NIH): 86 mg/dL (ref 0–99)
Triglycerides: 422 mg/dL — ABNORMAL HIGH (ref 0–149)
VLDL Cholesterol Cal: 69 mg/dL — ABNORMAL HIGH (ref 5–40)

## 2021-06-09 LAB — VITAMIN D 25 HYDROXY (VIT D DEFICIENCY, FRACTURES): Vit D, 25-Hydroxy: 61.6 ng/mL (ref 30.0–100.0)

## 2021-06-09 LAB — HEPATITIS C ANTIBODY: Hep C Virus Ab: 0.2 s/co ratio (ref 0.0–0.9)

## 2021-06-09 LAB — HEMOGLOBIN A1C
Est. average glucose Bld gHb Est-mCnc: 103 mg/dL
Hgb A1c MFr Bld: 5.2 % (ref 4.8–5.6)

## 2021-06-09 LAB — TSH: TSH: 2.33 u[IU]/mL (ref 0.450–4.500)

## 2021-06-09 MED ORDER — PRAVASTATIN SODIUM 40 MG PO TABS: ORAL_TABLET | ORAL | 5 refills | Status: DC

## 2021-06-26 ENCOUNTER — Other Ambulatory Visit: Payer: Self-pay | Admitting: *Deleted

## 2021-06-26 ENCOUNTER — Telehealth: Payer: Self-pay

## 2021-06-26 ENCOUNTER — Telehealth: Payer: Self-pay | Admitting: Internal Medicine

## 2021-06-26 MED ORDER — AMLODIPINE BESYLATE 5 MG PO TABS: ORAL_TABLET | ORAL | 0 refills | Status: DC

## 2021-06-26 NOTE — Telephone Encounter (Signed)
Pt medication sent to pharmacy  

## 2021-06-26 NOTE — Telephone Encounter (Signed)
Pt needs a refill on amlodipine refilled to [facility] [address]

## 2021-06-26 NOTE — Telephone Encounter (Signed)
Patient called needing refills on amlodipine ph# [phone]

## 2021-06-26 NOTE — Telephone Encounter (Signed)
This has already been sent for pt

## 2021-07-05 ENCOUNTER — Ambulatory Visit: Payer: BC Managed Care – PPO | Admitting: Internal Medicine

## 2021-10-14 IMAGING — CT CT HEAD W/O CM
3 series · 16 of 47 positions shown, 19 images · non-contrast
Comparison: [DATE][REDACTED] 1665

CLINICAL DATA: Nontraumatic seizure.

EXAM:
CT HEAD WITHOUT CONTRAST
TECHNIQUE: Contiguous axial images were obtained from the base of the skull
through the vertex without intravenous contrast.

[Series 2: head w o · axial · 0.46mm/px · z∈[+93,+223]mm · 10 of 32 slices shown, 13 images]
[im 3/32  brain]
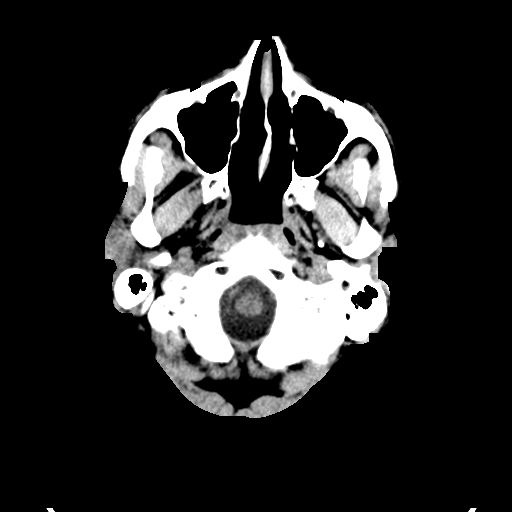
[im 3/32  bone]
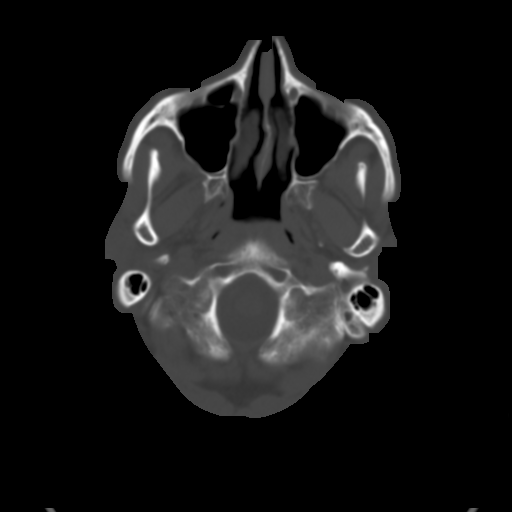
[im 6/32  brain]
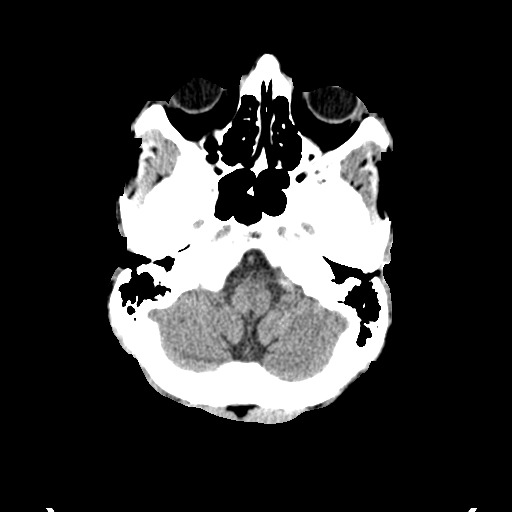
[im 9/32  brain]
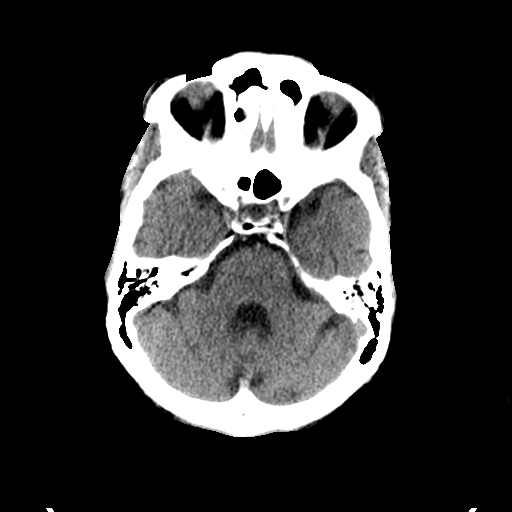
[im 11/32  brain]
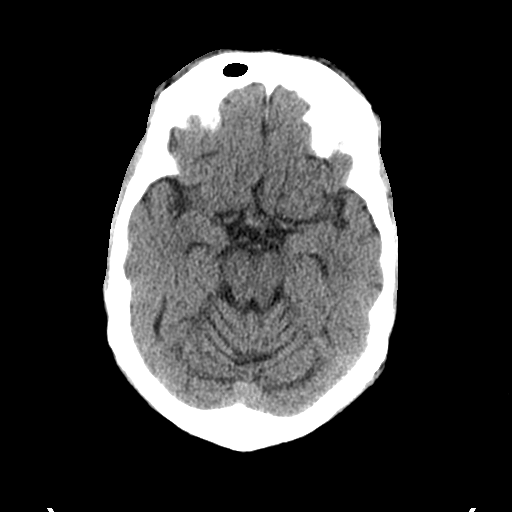
[im 14/32  brain]
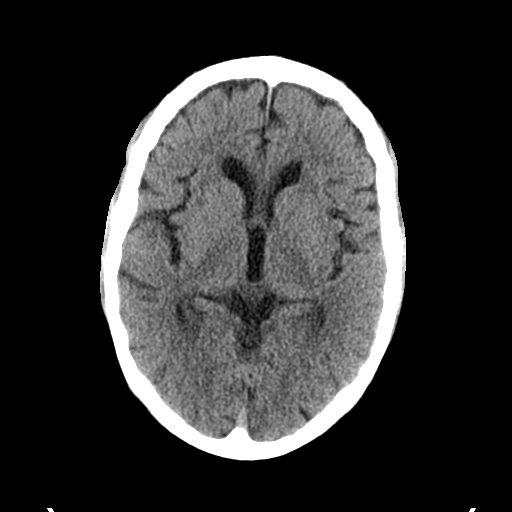
[im 14/32  bone]
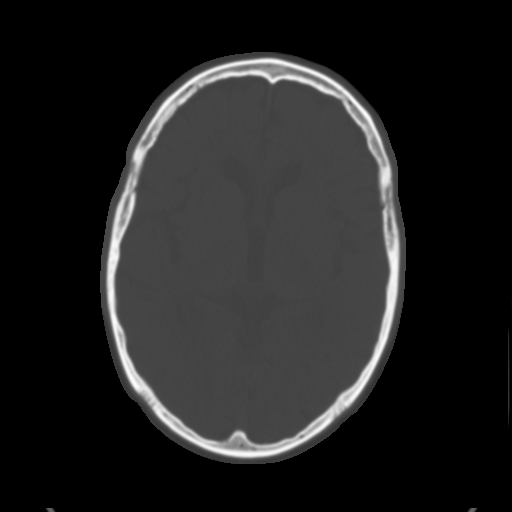
[im 18/32  brain]
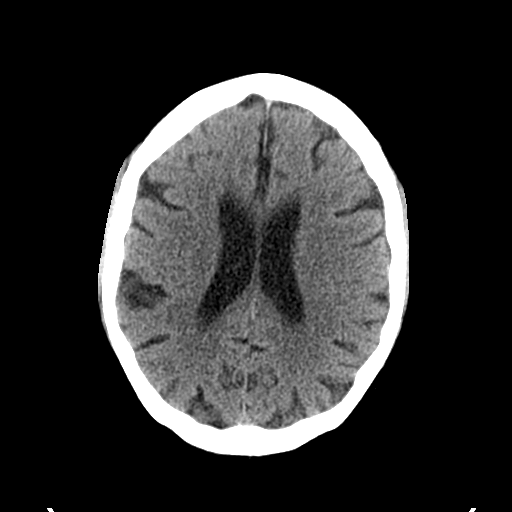
[im 21/32  brain]
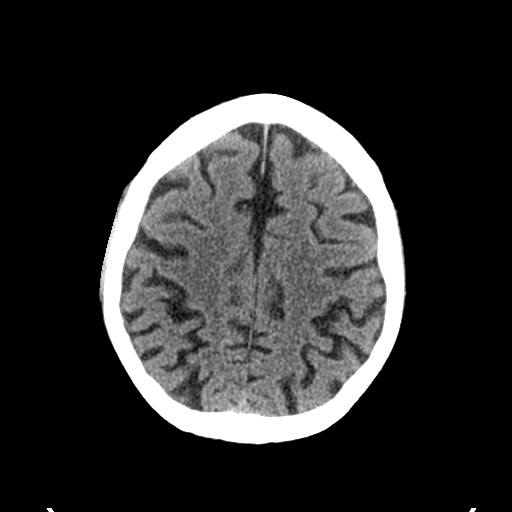
[im 24/32  brain]
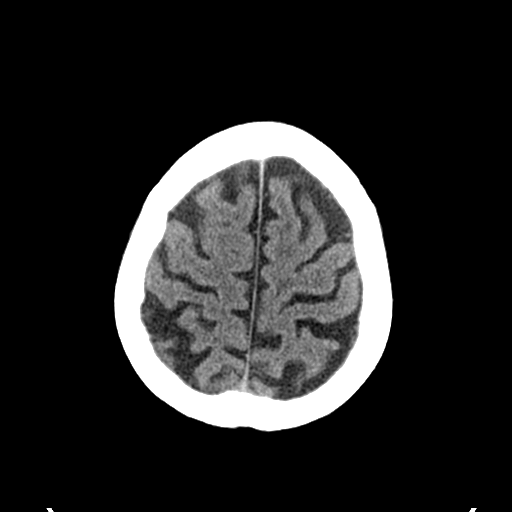
[im 26/32  brain]
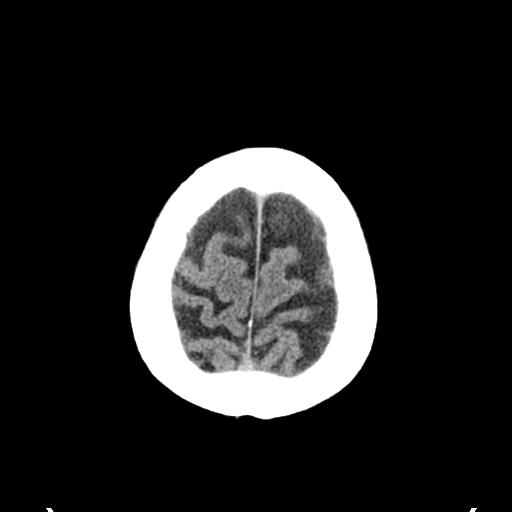
[im 26/32  bone]
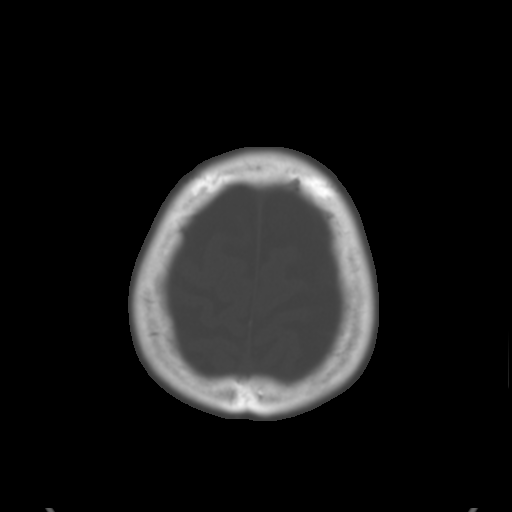
[im 29/32  brain]
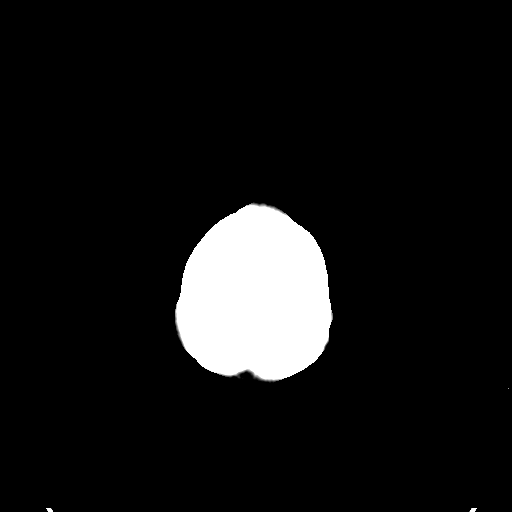

[Series 4: coronal soft · coronal · 0.33mm/px · 3 of 69 slices shown]
[im 23/69  brain]
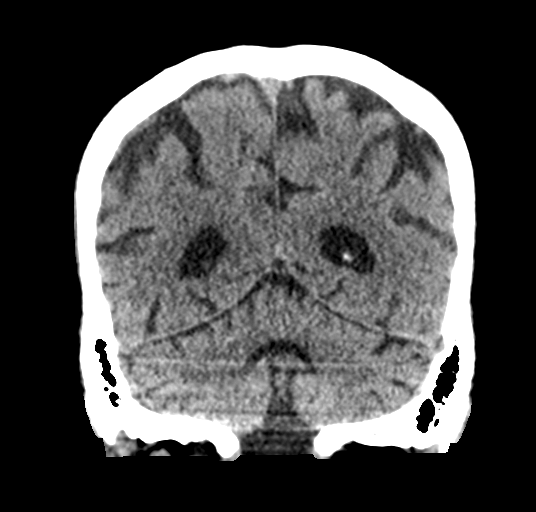
[im 31/69  brain]
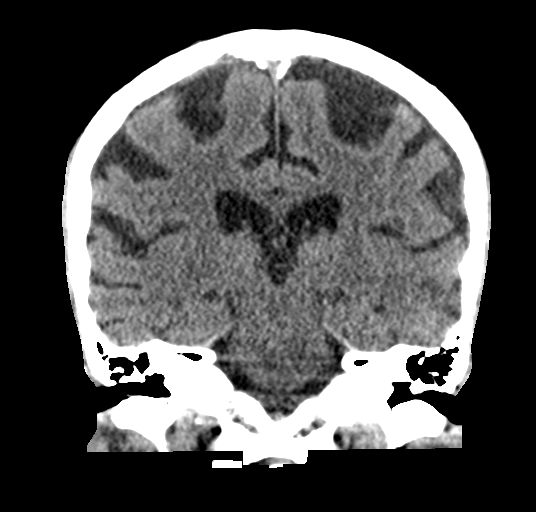
[im 38/69  brain]
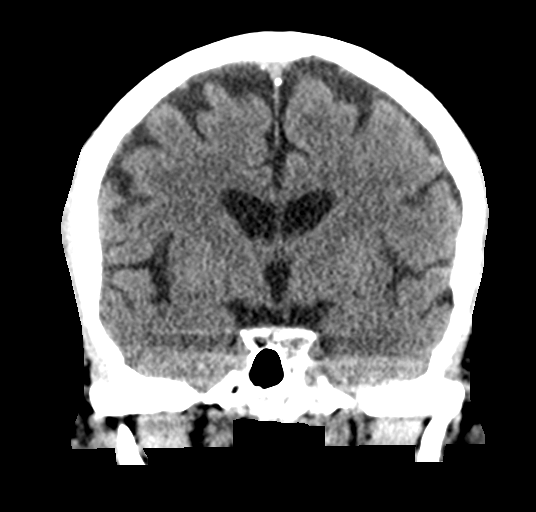

[Series 5: sagittal soft · sagittal · 0.35mm/px · 3 of 54 slices shown]
[im 18/54  brain]
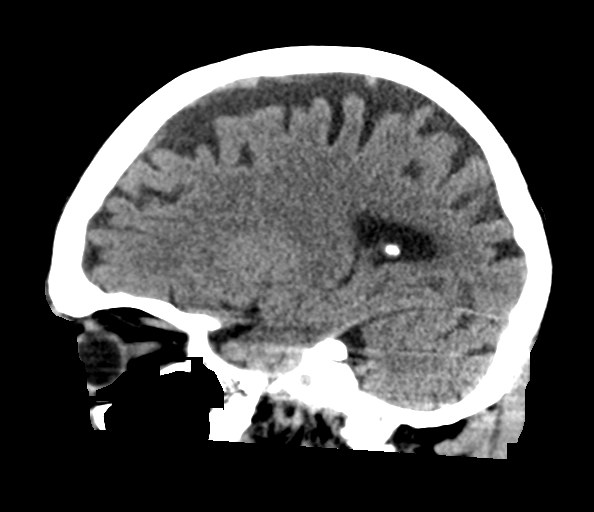
[im 27/54  brain]
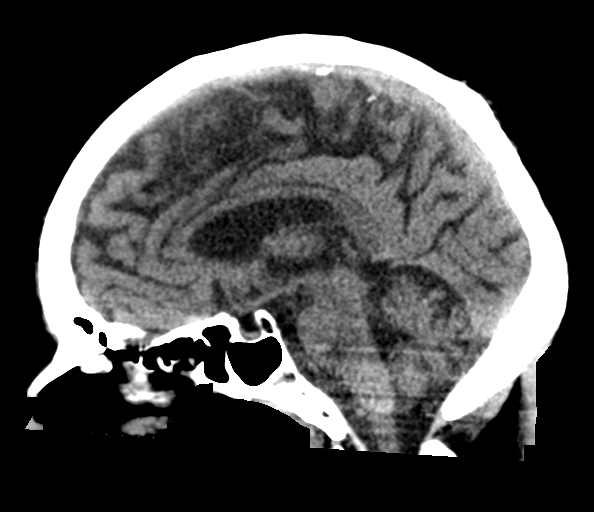
[im 36/54  brain]
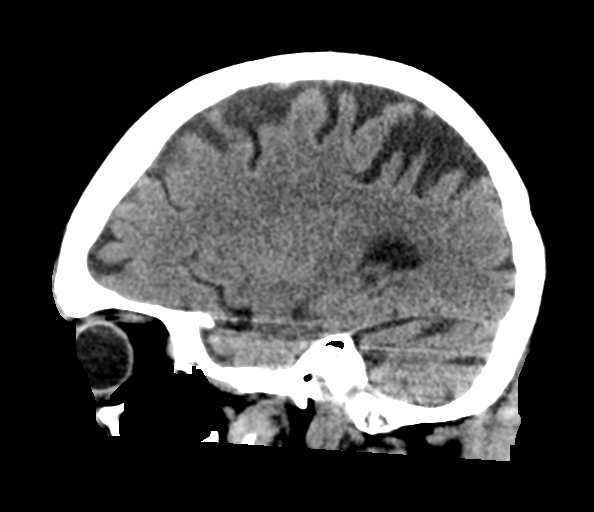

[16 of 47 positions shown; findings below may reference images not displayed]

FINDINGS: Brain: No evidence of acute infarction, hemorrhage, hydrocephalus,
extra-axial collection or mass lesion/mass effect. Mild chronic
diffuse atrophy is identified.

Vascular: No hyperdense vessel is noted.

Skull: Normal. Negative for fracture or focal lesion.

Sinuses/Orbits: No acute finding.

Other: None.
IMPRESSION: 1. No focal acute intracranial abnormality identified.
2. Mild chronic diffuse atrophy.

## 2021-11-02 ENCOUNTER — Other Ambulatory Visit: Payer: Self-pay

## 2021-11-02 ENCOUNTER — Encounter (INDEPENDENT_AMBULATORY_CARE_PROVIDER_SITE_OTHER): Payer: Self-pay

## 2021-11-02 ENCOUNTER — Encounter: Payer: BC Managed Care – PPO | Admitting: Nurse Practitioner

## 2021-11-09 ENCOUNTER — Encounter: Payer: Self-pay | Admitting: Internal Medicine

## 2021-11-09 ENCOUNTER — Encounter: Payer: BC Managed Care – PPO | Admitting: Internal Medicine

## 2021-11-09 ENCOUNTER — Other Ambulatory Visit: Payer: Self-pay

## 2021-11-09 ENCOUNTER — Ambulatory Visit (INDEPENDENT_AMBULATORY_CARE_PROVIDER_SITE_OTHER): Payer: BC Managed Care – PPO | Admitting: Internal Medicine

## 2021-11-09 VITALS — BP 132/84 | HR 93 | Resp 18 | Ht 67.0 in | Wt 125.0 lb

## 2021-11-09 DIAGNOSIS — I10 Essential (primary) hypertension: Secondary | ICD-10-CM | POA: Diagnosis not present

## 2021-11-09 DIAGNOSIS — Z125 Encounter for screening for malignant neoplasm of prostate: Secondary | ICD-10-CM

## 2021-11-09 DIAGNOSIS — E782 Mixed hyperlipidemia: Secondary | ICD-10-CM | POA: Diagnosis not present

## 2021-11-09 DIAGNOSIS — Z2821 Immunization not carried out because of patient refusal: Secondary | ICD-10-CM

## 2021-11-09 DIAGNOSIS — Z720 Tobacco use: Secondary | ICD-10-CM

## 2021-11-09 DIAGNOSIS — Z0001 Encounter for general adult medical examination with abnormal findings: Secondary | ICD-10-CM | POA: Diagnosis not present

## 2021-11-09 DIAGNOSIS — Z96641 Presence of right artificial hip joint: Secondary | ICD-10-CM | POA: Insufficient documentation

## 2021-11-09 DIAGNOSIS — Z1211 Encounter for screening for malignant neoplasm of colon: Secondary | ICD-10-CM

## 2021-11-09 MED ORDER — AMLODIPINE BESYLATE 5 MG PO TABS: 5.0000 mg | ORAL_TABLET | Freq: Every day | ORAL | 1 refills | Status: DC

## 2021-11-09 NOTE — Assessment & Plan Note (Signed)
On Pravastatin Check lipid profile 

## 2021-11-09 NOTE — Assessment & Plan Note (Signed)
BP Readings from Last 1 Encounters:  11/09/21 132/84   Well-controlled Counseled for compliance with the medications Advised DASH diet and moderate exercise/walking, at least 150 mins/week

## 2021-11-09 NOTE — Assessment & Plan Note (Signed)

## 2021-11-09 NOTE — Assessment & Plan Note (Addendum)
Smokes about 0.5 pack/day  Asked about quitting: confirms that he/she currently smokes cigarettes Advise to quit smoking: Educated about QUITTING to reduce the risk of cancer, cardio and cerebrovascular disease. Assess willingness: Unwilling to quit at this time, but is working on cutting back. Assist with counseling and pharmacotherapy: Counseled for 5 minutes and literature provided. Arrange for follow up: follow up and continue to offer help. 

## 2021-11-09 NOTE — Assessment & Plan Note (Signed)
For avascular necrosis of hip, doing well now

## 2021-11-09 NOTE — Patient Instructions (Signed)
Please continue to take medications as prescribed.  Please continue to follow low salt diet and perform moderate exercise/walking at least 150 mins/week.  

## 2021-11-09 NOTE — Assessment & Plan Note (Signed)
Ordered PSA after discussing its limitations for prostate cancer screening, including false positive results leading additional investigations. 

## 2021-11-09 NOTE — Progress Notes (Signed)
° °Established Patient Office Visit ° °Subjective:  °Patient ID: Donald Stewart, male    DOB: 03/22/1971  Age: 50 y.o. MRN: [id number] ° °CC:  °Chief Complaint  °Patient presents with  ° Annual Exam  °  Annual exam   ° ° °HPI °Donald Stewart is a 50 y.o. male with past medical history of HTN, HLD, alcohol and tobacco abuse who presents for annual physical. ° °HTN: BP is well-controlled. Takes medications regularly. Patient denies headache, dizziness, chest pain, dyspnea or palpitations. ° °He had right THA for avascular necrosis of right hip.  He has been doing well since the surgery.  Denies any hip pain currently.  He has started smoking 0.5 pack/day now. ° °He denied colonoscopy, but agrees to get Cologuard. ° °He denied flu and Shingrix vaccines in the office today. ° °Past Medical History:  °Diagnosis Date  ° Asthma   ° Avascular necrosis of hip, right (HCC) 06/08/2021  ° Hypercholesterolemia   ° Hypertension   ° ° °Past Surgical History:  °Procedure Laterality Date  ° TOTAL HIP ARTHROPLASTY Right   ° ° °Family History  °Problem Relation Age of Onset  ° Hypertension Mother   ° ° °Social History  ° °Socioeconomic History  ° Marital status: Divorced  °  Spouse name: Not on file  ° Number of children: Not on file  ° Years of education: Not on file  ° Highest education level: Not on file  °Occupational History  ° Not on file  °Tobacco Use  ° Smoking status: Former  °  Packs/day: 0.50  °  Years: 20.00  °  Pack years: 10.00  °  Types: Cigarettes  ° Smokeless tobacco: Never  °Vaping Use  ° Vaping Use: Never used  °Substance and Sexual Activity  ° Alcohol use: Yes  °  Comment: drinks until he passes out daily. has no desire to stop  ° Drug use: No  ° Sexual activity: Not on file  °Other Topics Concern  ° Not on file  °Social History Narrative  ° Not on file  ° °Social Determinants of Health  ° °Financial Resource Strain: Not on file  °Food Insecurity: Not on file  °Transportation Needs: Not on file  °Physical  Activity: Not on file  °Stress: Not on file  °Social Connections: Not on file  °Intimate Partner Violence: Not on file  ° ° °Outpatient Medications Prior to Visit  °Medication Sig Dispense Refill  ° pravastatin (PRAVACHOL) 40 MG tablet One tablet daily 30 tablet 5  ° amLODipine (NORVASC) 5 MG tablet TK 1 T PO QD 90 tablet 0  ° meloxicam (MOBIC) 7.5 MG tablet Take 1 tablet (7.5 mg total) by mouth 2 (two) times daily as needed for pain. (Patient not taking: Reported on 11/09/2021) 15 tablet 0  ° °No facility-administered medications prior to visit.  ° ° °No Known Allergies ° °ROS °Review of Systems  °Constitutional:  Negative for chills and fever.  °HENT:  Negative for congestion and sore throat.   °Eyes:  Negative for pain and discharge.  °Respiratory:  Negative for cough and shortness of breath.   °Cardiovascular:  Negative for chest pain and palpitations.  °Gastrointestinal:  Negative for constipation, diarrhea, nausea and vomiting.  °Endocrine: Negative for polydipsia and polyuria.  °Genitourinary:  Negative for dysuria and hematuria.  °Musculoskeletal:  Negative for neck pain and neck stiffness.  °Skin:  Negative for rash.  °Neurological:  Negative for dizziness, weakness, numbness and headaches.  °Psychiatric/Behavioral:  Negative   Negative for agitation and behavioral problems.      Objective:    Physical Exam Vitals reviewed.  Constitutional:      General: He is not in acute distress.    Appearance: He is not diaphoretic.  HENT:     Head: Normocephalic and atraumatic.     Nose: Nose normal.     Mouth/Throat:     Mouth: Mucous membranes are moist.  Eyes:     General: No scleral icterus.    Extraocular Movements: Extraocular movements intact.  Cardiovascular:     Rate and Rhythm: Normal rate and regular rhythm.     Pulses: Normal pulses.     Heart sounds: Normal heart sounds. No murmur heard. Pulmonary:     Breath sounds: Normal breath sounds. No wheezing or rales.  Abdominal:     Palpations:  Abdomen is soft.     Tenderness: There is no abdominal tenderness.  Musculoskeletal:     Cervical back: Neck supple. No tenderness.     Right lower leg: No edema.     Left lower leg: No edema.  Skin:    General: Skin is warm.     Findings: No rash.  Neurological:     General: No focal deficit present.     Mental Status: He is alert and oriented to person, place, and time.     Cranial Nerves: No cranial nerve deficit.     Sensory: No sensory deficit.     Motor: No weakness.  Psychiatric:        Mood and Affect: Mood normal.        Behavior: Behavior normal.    BP 132/84 (BP Location: Right Arm, Patient Position: Sitting, Cuff Size: Normal)    Pulse 93    Resp 18    Ht 5' 7" (1.702 m)    Wt 125 lb 0.6 oz (56.7 kg)    SpO2 96%    BMI 19.58 kg/m  Wt Readings from Last 3 Encounters:  11/09/21 125 lb 0.6 oz (56.7 kg)  06/08/21 124 lb 1.9 oz (56.3 kg)  01/05/21 117 lb 9.6 oz (53.3 kg)    Lab Results  Component Value Date   TSH 2.330 06/08/2021   Lab Results  Component Value Date   WBC 6.2 06/08/2021   HGB 12.7 (L) 06/08/2021   HCT 36.6 (L) 06/08/2021   MCV 97 06/08/2021   PLT 237 06/08/2021   Lab Results  Component Value Date   NA 142 06/08/2021   K 4.2 06/08/2021   CO2 23 06/08/2021   GLUCOSE 85 06/08/2021   BUN 12 06/08/2021   CREATININE 0.84 06/08/2021   BILITOT 0.4 06/08/2021   ALKPHOS 93 06/08/2021   AST 24 06/08/2021   ALT 12 06/08/2021   PROT 7.6 06/08/2021   ALBUMIN 4.5 06/08/2021   CALCIUM 10.3 (H) 06/08/2021   ANIONGAP 16 (H) 08/04/2020   EGFR 106 06/08/2021   Lab Results  Component Value Date   CHOL 199 06/08/2021   Lab Results  Component Value Date   HDL 44 06/08/2021   Lab Results  Component Value Date   LDLCALC 86 06/08/2021   Lab Results  Component Value Date   TRIG 422 (H) 06/08/2021   Lab Results  Component Value Date   CHOLHDL 4.5 06/08/2021   Lab Results  Component Value Date   HGBA1C 5.2 06/08/2021      Assessment &  Plan:   Problem List Items Addressed This Visit  Cardiovascular and Mediastinum  ° Primary hypertension  °  BP Readings from Last 1 Encounters:  °11/09/21 132/84  °Well-controlled °Counseled for compliance with the medications °Advised DASH diet and moderate exercise/walking, at least 150 mins/week °  °  ° Relevant Medications  ° amLODipine (NORVASC) 5 MG tablet  °  ° Other  ° Tobacco abuse  °  Smokes about 0.5 pack/day ° °Asked about quitting: confirms that he/she currently smokes cigarettes °Advise to quit smoking: Educated about QUITTING to reduce the risk of cancer, cardio and cerebrovascular disease. °Assess willingness: Unwilling to quit at this time, but is working on cutting back. °Assist with counseling and pharmacotherapy: Counseled for 5 minutes and literature provided. °Arrange for follow up: follow up and continue to offer help. °  °  ° Mixed hyperlipidemia  °  On Pravastatin °Check lipid profile °  °  ° Relevant Medications  ° amLODipine (NORVASC) 5 MG tablet  ° Other Relevant Orders  ° Lipid Profile  ° Encounter for general adult medical examination with abnormal findings - Primary  °  Physical exam as documented. °Counseling done  re healthy lifestyle involving commitment to 150 minutes exercise per week, heart healthy diet, and attaining healthy weight.The importance of adequate sleep also discussed. °Changes in health habits are decided on by the patient with goals and time frames  set for achieving them. °Immunization and cancer screening needs are specifically addressed at this visit. °  °  ° Relevant Orders  ° CMP14+EGFR  ° CBC with Differential/Platelet  ° S/P total right hip arthroplasty  °  For avascular necrosis of hip, doing well now °  °  ° Prostate cancer screening  °  Ordered PSA after discussing its limitations for prostate cancer screening, including false positive results leading additional investigations. °  °  ° Relevant Orders  ° PSA  ° °Other Visit Diagnoses   ° ° Colon  cancer screening      ° Relevant Orders  ° Cologuard  ° Refused influenza vaccine      ° °  ° ° °Meds ordered this encounter  °Medications  ° amLODipine (NORVASC) 5 MG tablet  °  Sig: Take 1 tablet (5 mg total) by mouth daily.  °  Dispense:  90 tablet  °  Refill:  1  ° ° °Follow-up: Return in about 6 months (around 05/10/2022) for HTN.  ° ° °Rutwik K Patel, MD °

## 2021-11-10 LAB — CMP14+EGFR
ALT: 35 IU/L (ref 0–44)
AST: 132 IU/L — ABNORMAL HIGH (ref 0–40)
Albumin/Globulin Ratio: 2.1 (ref 1.2–2.2)
Albumin: 4.9 g/dL (ref 3.8–4.9)
Alkaline Phosphatase: 163 IU/L — ABNORMAL HIGH (ref 44–121)
BUN/Creatinine Ratio: 8 — ABNORMAL LOW (ref 9–20)
BUN: 6 mg/dL (ref 6–24)
Bilirubin Total: 0.7 mg/dL (ref 0.0–1.2)
CO2: 29 mmol/L (ref 20–29)
Calcium: 9.6 mg/dL (ref 8.7–10.2)
Chloride: 94 mmol/L — ABNORMAL LOW (ref 96–106)
Creatinine, Ser: 0.75 mg/dL — ABNORMAL LOW (ref 0.76–1.27)
Globulin, Total: 2.3 g/dL (ref 1.5–4.5)
Glucose: 89 mg/dL (ref 70–99)
Potassium: 3.3 mmol/L — ABNORMAL LOW (ref 3.5–5.2)
Sodium: 141 mmol/L (ref 134–144)
Total Protein: 7.2 g/dL (ref 6.0–8.5)
eGFR: 109 mL/min/{1.73_m2} (ref >59)

## 2021-11-10 LAB — CBC WITH DIFFERENTIAL/PLATELET
Basophils Absolute: 0 10*3/uL (ref 0.0–0.2)
Basos: 0 %
EOS (ABSOLUTE): 0.1 10*3/uL (ref 0.0–0.4)
Eos: 1 %
Hematocrit: 36.2 % — ABNORMAL LOW (ref 37.5–51.0)
Hemoglobin: 12.5 g/dL — ABNORMAL LOW (ref 13.0–17.7)
Immature Grans (Abs): 0 10*3/uL (ref 0.0–0.1)
Immature Granulocytes: 0 %
Lymphocytes Absolute: 1.6 10*3/uL (ref 0.7–3.1)
Lymphs: 33 %
MCH: 36 pg — ABNORMAL HIGH (ref 26.6–33.0)
MCHC: 34.5 g/dL (ref 31.5–35.7)
MCV: 104 fL — ABNORMAL HIGH (ref 79–97)
Monocytes Absolute: 0.6 10*3/uL (ref 0.1–0.9)
Monocytes: 12 %
Neutrophils Absolute: 2.5 10*3/uL (ref 1.4–7.0)
Neutrophils: 54 %
Platelets: 167 10*3/uL (ref 150–450)
RBC: 3.47 x10E6/uL — ABNORMAL LOW (ref 4.14–5.80)
RDW: 12.4 % (ref 11.6–15.4)
WBC: 4.8 10*3/uL (ref 3.4–10.8)

## 2021-11-10 LAB — LIPID PANEL
Chol/HDL Ratio: 2.2 ratio (ref 0.0–5.0)
Cholesterol, Total: 204 mg/dL — ABNORMAL HIGH (ref 100–199)
HDL: 94 mg/dL (ref >39)
LDL Chol Calc (NIH): 65 mg/dL (ref 0–99)
Triglycerides: 296 mg/dL — ABNORMAL HIGH (ref 0–149)
VLDL Cholesterol Cal: 45 mg/dL — ABNORMAL HIGH (ref 5–40)

## 2021-11-10 LAB — PSA: Prostate Specific Ag, Serum: 1.6 ng/mL (ref 0.0–4.0)

## 2021-11-30 LAB — COLOGUARD: COLOGUARD: NEGATIVE

## 2021-12-05 ENCOUNTER — Encounter: Payer: Self-pay | Admitting: *Deleted

## 2021-12-13 ENCOUNTER — Telehealth: Payer: Self-pay

## 2021-12-13 NOTE — Telephone Encounter (Signed)
Pt came in office with labwork result papers and wanted to know what the labs meant.  I explained everything to him that Dr Allena Katz had said and he states verbal understanding ang will get folic acid and b12 supplements otc

## 2022-04-06 ENCOUNTER — Other Ambulatory Visit: Payer: Self-pay | Admitting: Internal Medicine

## 2022-05-10 ENCOUNTER — Ambulatory Visit: Payer: BC Managed Care – PPO | Admitting: Internal Medicine

## 2022-07-04 ENCOUNTER — Encounter: Payer: Self-pay | Admitting: Internal Medicine

## 2022-07-04 ENCOUNTER — Ambulatory Visit: Payer: BC Managed Care – PPO | Admitting: Internal Medicine

## 2022-07-04 VITALS — BP 109/68 | HR 90 | Ht 67.0 in | Wt 115.8 lb

## 2022-07-04 DIAGNOSIS — F1029 Alcohol dependence with unspecified alcohol-induced disorder: Secondary | ICD-10-CM

## 2022-07-04 DIAGNOSIS — J309 Allergic rhinitis, unspecified: Secondary | ICD-10-CM

## 2022-07-04 DIAGNOSIS — Z720 Tobacco use: Secondary | ICD-10-CM | POA: Diagnosis not present

## 2022-07-04 DIAGNOSIS — Z23 Encounter for immunization: Secondary | ICD-10-CM

## 2022-07-04 DIAGNOSIS — F17210 Nicotine dependence, cigarettes, uncomplicated: Secondary | ICD-10-CM

## 2022-07-04 DIAGNOSIS — I10 Essential (primary) hypertension: Secondary | ICD-10-CM

## 2022-07-04 DIAGNOSIS — E441 Mild protein-calorie malnutrition: Secondary | ICD-10-CM

## 2022-07-04 DIAGNOSIS — E782 Mixed hyperlipidemia: Secondary | ICD-10-CM

## 2022-07-04 DIAGNOSIS — E46 Unspecified protein-calorie malnutrition: Secondary | ICD-10-CM | POA: Insufficient documentation

## 2022-07-04 MED ORDER — PRAVASTATIN SODIUM 40 MG PO TABS
ORAL_TABLET | ORAL | 1 refills | Status: DC
Start: 2022-07-04 — End: 2022-11-14

## 2022-07-04 MED ORDER — CETIRIZINE HCL 10 MG PO TABS: 10.0000 mg | ORAL_TABLET | Freq: Every day | ORAL | 11 refills | Status: DC

## 2022-07-04 MED ORDER — FLUTICASONE PROPIONATE 50 MCG/ACT NA SUSP: 2.0000 | Freq: Every day | NASAL | 6 refills | Status: DC

## 2022-07-04 MED ORDER — AMLODIPINE BESYLATE 5 MG PO TABS: 5.0000 mg | ORAL_TABLET | Freq: Every day | ORAL | 1 refills | Status: DC

## 2022-07-04 NOTE — Assessment & Plan Note (Signed)
BP Readings from Last 1 Encounters:  07/04/22 109/68   Well-controlled Counseled for compliance with the medications Advised DASH diet and moderate exercise/walking, at least 150 mins/week

## 2022-07-04 NOTE — Assessment & Plan Note (Signed)
BMI Readings from Last 3 Encounters:  07/04/22 18.14 kg/m  11/09/21 19.58 kg/m  06/08/21 19.44 kg/m   Likely due to alcohol abuse, needs to cut down Advised to eat at regular intervals Advised to take protein supplements

## 2022-07-04 NOTE — Assessment & Plan Note (Signed)
Takes about 6-8 alcoholic drinks in a day Needs to cut down, but he is not willing despite counseling AA support groups information provided 

## 2022-07-04 NOTE — Assessment & Plan Note (Signed)
On Pravastatin Check lipid profile 

## 2022-07-04 NOTE — Patient Instructions (Signed)
Please take Zyrtec for allergies.  Please use Flonase for allergies.  Please try to cut down alcohol use. Please try to cut down -> quit smoking.

## 2022-07-04 NOTE — Progress Notes (Signed)
Established Patient Office Visit  Subjective:  Patient ID: Donald Stewart, male    DOB: Apr 16, 1971  Age: 51 y.o. MRN: [id number]  CC:  Chief Complaint  Patient presents with   Hypertension    Follow up    HPI Donald Stewart is a 51 y.o. male with past medical history of HTN, HLD, alcohol and tobacco abuse who presents for f/u of his chronic medical conditions. Her mother is present during the visit.  HTN: BP is well-controlled. Takes medications regularly. Patient denies headache, dizziness, chest pain, dyspnea or palpitations.  He c/o nasal congestion, postnasal drip and sinus pressure, which are chronic. Denies any dyspnea or wheezing.  He has been smoking about 0.5 pack/day now. He takes about 6-8 alcoholic drinks (vodka) in a day. He is not willing to decrease alcohol and smoking despite counseling. He denies to join any support group. He has lost about 10 lbs since the last visit.  He denies any fever, chills, decreased appetite, nausea, vomiting, hemoptysis or chronic cough, night sweats, LAD, diarrhea.   Past Medical History:  Diagnosis Date   Asthma    Avascular necrosis of hip, right ([place]) 06/08/2021   Hypercholesterolemia    Hypertension     Past Surgical History:  Procedure Laterality Date   TOTAL HIP ARTHROPLASTY Right     Family History  Problem Relation Age of Onset   Hypertension Mother     Social History   Socioeconomic History   Marital status: Divorced    Spouse name: Not on file   Number of children: Not on file   Years of education: Not on file   Highest education level: Not on file  Occupational History   Not on file  Tobacco Use   Smoking status: Former    Packs/day: 0.50    Years: 20.00    Total pack years: 10.00    Types: Cigarettes   Smokeless tobacco: Never  Vaping Use   Vaping Use: Never used  Substance and Sexual Activity   Alcohol use: Yes    Comment: drinks until he passes out daily. has no desire to stop   Drug  use: No   Sexual activity: Not on file  Other Topics Concern   Not on file  Social History Narrative   Not on file   Social Determinants of Health   Financial Resource Strain: Not on file  Food Insecurity: Not on file  Transportation Needs: Not on file  Physical Activity: Not on file  Stress: Not on file  Social Connections: Not on file  Intimate Partner Violence: Not on file    Outpatient Medications Prior to Visit  Medication Sig Dispense Refill   amLODipine (NORVASC) 5 MG tablet Take 1 tablet (5 mg total) by mouth daily. 90 tablet 1   pravastatin (PRAVACHOL) 40 MG tablet TAKE 1 TABLET BY MOUTH DAILY 90 tablet 1   No facility-administered medications prior to visit.    No Known Allergies  ROS Review of Systems  Constitutional:  Positive for unexpected weight change. Negative for chills and fever.  HENT:  Positive for congestion, postnasal drip and sinus pressure. Negative for sore throat.   Eyes:  Negative for pain and discharge.  Respiratory:  Negative for cough and shortness of breath.   Cardiovascular:  Negative for chest pain and palpitations.  Gastrointestinal:  Negative for constipation, diarrhea, nausea and vomiting.  Endocrine: Negative for polydipsia and polyuria.  Genitourinary:  Negative for dysuria and hematuria.  Musculoskeletal:  Negative  for neck pain and neck stiffness.  Skin:  Negative for rash.  Neurological:  Negative for dizziness, weakness, numbness and headaches.  Psychiatric/Behavioral:  Negative for agitation and behavioral problems.       Objective:    Physical Exam Vitals reviewed.  Constitutional:      General: He is not in acute distress.    Appearance: He is underweight. He is not diaphoretic.  HENT:     Head: Normocephalic and atraumatic.     Nose: Congestion and rhinorrhea present.     Mouth/Throat:     Mouth: Mucous membranes are moist.  Eyes:     General: No scleral icterus.    Extraocular Movements: Extraocular movements  intact.  Cardiovascular:     Rate and Rhythm: Normal rate and regular rhythm.     Pulses: Normal pulses.     Heart sounds: Normal heart sounds. No murmur heard. Pulmonary:     Breath sounds: Normal breath sounds. No wheezing or rales.  Musculoskeletal:     Cervical back: Neck supple. No tenderness.     Right lower leg: No edema.     Left lower leg: No edema.  Skin:    General: Skin is warm.     Findings: No rash.  Neurological:     General: No focal deficit present.     Mental Status: He is alert and oriented to person, place, and time.     Cranial Nerves: No cranial nerve deficit.     Sensory: No sensory deficit.     Motor: No weakness.  Psychiatric:        Mood and Affect: Mood normal.        Behavior: Behavior normal.     BP 109/68 (BP Location: Right Arm, Patient Position: Sitting, Cuff Size: Normal)   Pulse 90   Ht $R'5\' 7"'zj$  (1.702 m)   Wt 115 lb 12.8 oz (52.5 kg)   SpO2 (!) 89%   BMI 18.14 kg/m  Wt Readings from Last 3 Encounters:  07/04/22 115 lb 12.8 oz (52.5 kg)  11/09/21 125 lb 0.6 oz (56.7 kg)  06/08/21 124 lb 1.9 oz (56.3 kg)    Lab Results  Component Value Date   TSH 2.330 06/08/2021   Lab Results  Component Value Date   WBC 4.8 11/09/2021   HGB 12.5 (L) 11/09/2021   HCT 36.2 (L) 11/09/2021   MCV 104 (H) 11/09/2021   PLT 167 11/09/2021   Lab Results  Component Value Date   NA 141 11/09/2021   K 3.3 (L) 11/09/2021   CO2 29 11/09/2021   GLUCOSE 89 11/09/2021   BUN 6 11/09/2021   CREATININE 0.75 (L) 11/09/2021   BILITOT 0.7 11/09/2021   ALKPHOS 163 (H) 11/09/2021   AST 132 (H) 11/09/2021   ALT 35 11/09/2021   PROT 7.2 11/09/2021   ALBUMIN 4.9 11/09/2021   CALCIUM 9.6 11/09/2021   ANIONGAP 16 (H) 08/04/2020   EGFR 109 11/09/2021   Lab Results  Component Value Date   CHOL 204 (H) 11/09/2021   Lab Results  Component Value Date   HDL 94 11/09/2021   Lab Results  Component Value Date   LDLCALC 65 11/09/2021   Lab Results  Component  Value Date   TRIG 296 (H) 11/09/2021   Lab Results  Component Value Date   CHOLHDL 2.2 11/09/2021   Lab Results  Component Value Date   HGBA1C 5.2 06/08/2021      Assessment & Plan:   Problem List Items  Addressed This Visit       Cardiovascular and Mediastinum   Primary hypertension - Primary    BP Readings from Last 1 Encounters:  07/04/22 109/68  Well-controlled Counseled for compliance with the medications Advised DASH diet and moderate exercise/walking, at least 150 mins/week      Relevant Medications   amLODipine (NORVASC) 5 MG tablet   pravastatin (PRAVACHOL) 40 MG tablet     Respiratory   Allergic sinusitis    Chronic nasal congestion and rhinorrhea likely due to allergic sinusitis Started Zyrtec Flonase for allergies      Relevant Medications   cetirizine (ZYRTEC) 10 MG tablet   fluticasone (FLONASE) 50 MCG/ACT nasal spray     Other   Tobacco abuse    Smokes about 0.5 pack/day  Asked about quitting: confirms that he/she currently smokes cigarettes Advise to quit smoking: Educated about QUITTING to reduce the risk of cancer, cardio and cerebrovascular disease. Assess willingness: Unwilling to quit at this time, but is working on cutting back. Assist with counseling and pharmacotherapy: Counseled for 5 minutes and literature provided. Arrange for follow up: follow up and continue to offer help.      Mixed hyperlipidemia    On Pravastatin Check lipid profile      Relevant Medications   amLODipine (NORVASC) 5 MG tablet   pravastatin (PRAVACHOL) 40 MG tablet   Mild protein-calorie malnutrition (HCC)    BMI Readings from Last 3 Encounters:  07/04/22 18.14 kg/m  11/09/21 19.58 kg/m  06/08/21 19.44 kg/m  Likely due to alcohol abuse, needs to cut down Advised to eat at regular intervals Advised to take protein supplements      Relevant Orders   CMP14+EGFR   CBC   Alcohol dependence with unspecified alcohol-induced disorder ([place])    Takes  about 6-8 alcoholic drinks in a day Needs to cut down, but he is not willing despite counseling AA support groups information provided      Relevant Orders   CMP14+EGFR   Other Visit Diagnoses     Need for immunization against influenza       Relevant Orders   Flu Vaccine QUAD 16moIM (Fluarix, Fluzone & Alfiuria Quad PF) (Completed)       Meds ordered this encounter  Medications   amLODipine (NORVASC) 5 MG tablet    Sig: Take 1 tablet (5 mg total) by mouth daily.    Dispense:  90 tablet    Refill:  1   pravastatin (PRAVACHOL) 40 MG tablet    Sig: TAKE 1 TABLET BY MOUTH DAILY    Dispense:  90 tablet    Refill:  1    **Patient requests 90 days supply**   cetirizine (ZYRTEC) 10 MG tablet    Sig: Take 1 tablet (10 mg total) by mouth daily.    Dispense:  30 tablet    Refill:  11   fluticasone (FLONASE) 50 MCG/ACT nasal spray    Sig: Place 2 sprays into both nostrils daily.    Dispense:  16 g    Refill:  6    Follow-up: Return in about 4 months (around 11/03/2022) for Annual physical.    RLindell Spar MD

## 2022-07-04 NOTE — Assessment & Plan Note (Signed)
Smokes about 0.5 pack/day  Asked about quitting: confirms that he/she currently smokes cigarettes Advise to quit smoking: Educated about QUITTING to reduce the risk of cancer, cardio and cerebrovascular disease. Assess willingness: Unwilling to quit at this time, but is working on cutting back. Assist with counseling and pharmacotherapy: Counseled for 5 minutes and literature provided. Arrange for follow up: follow up and continue to offer help. 

## 2022-07-04 NOTE — Assessment & Plan Note (Signed)
Chronic nasal congestion and rhinorrhea likely due to allergic sinusitis Started Zyrtec Flonase for allergies

## 2022-07-05 ENCOUNTER — Other Ambulatory Visit: Payer: Self-pay | Admitting: Internal Medicine

## 2022-07-05 DIAGNOSIS — E876 Hypokalemia: Secondary | ICD-10-CM | POA: Insufficient documentation

## 2022-07-05 LAB — CMP14+EGFR
ALT: 13 IU/L (ref 0–44)
AST: 51 IU/L — ABNORMAL HIGH (ref 0–40)
Albumin/Globulin Ratio: 1.4 (ref 1.2–2.2)
Albumin: 4.3 g/dL (ref 3.8–4.9)
Alkaline Phosphatase: 85 IU/L (ref 44–121)
BUN/Creatinine Ratio: 8 — ABNORMAL LOW (ref 9–20)
BUN: 6 mg/dL (ref 6–24)
Bilirubin Total: 1 mg/dL (ref 0.0–1.2)
CO2: 31 mmol/L — ABNORMAL HIGH (ref 20–29)
Calcium: 8.9 mg/dL (ref 8.7–10.2)
Chloride: 86 mmol/L — ABNORMAL LOW (ref 96–106)
Creatinine, Ser: 0.75 mg/dL — ABNORMAL LOW (ref 0.76–1.27)
Globulin, Total: 3 g/dL (ref 1.5–4.5)
Glucose: 100 mg/dL — ABNORMAL HIGH (ref 70–99)
Potassium: 2.9 mmol/L — ABNORMAL LOW (ref 3.5–5.2)
Sodium: 135 mmol/L (ref 134–144)
Total Protein: 7.3 g/dL (ref 6.0–8.5)
eGFR: 109 mL/min/{1.73_m2} (ref >59)

## 2022-07-05 LAB — CBC
Hematocrit: 30.6 % — ABNORMAL LOW (ref 37.5–51.0)
Hemoglobin: 11 g/dL — ABNORMAL LOW (ref 13.0–17.7)
MCH: 35.9 pg — ABNORMAL HIGH (ref 26.6–33.0)
MCHC: 35.9 g/dL — ABNORMAL HIGH (ref 31.5–35.7)
MCV: 100 fL — ABNORMAL HIGH (ref 79–97)
Platelets: 127 10*3/uL — ABNORMAL LOW (ref 150–450)
RBC: 3.06 x10E6/uL — ABNORMAL LOW (ref 4.14–5.80)
RDW: 11.4 % — ABNORMAL LOW (ref 11.6–15.4)
WBC: 10.2 10*3/uL (ref 3.4–10.8)

## 2022-07-05 MED ORDER — POTASSIUM CHLORIDE CRYS ER 20 MEQ PO TBCR: 20.0000 meq | EXTENDED_RELEASE_TABLET | Freq: Every day | ORAL | 5 refills | Status: DC

## 2022-07-10 ENCOUNTER — Ambulatory Visit: Payer: BC Managed Care – PPO | Admitting: Internal Medicine

## 2022-07-10 ENCOUNTER — Encounter: Payer: Self-pay | Admitting: Internal Medicine

## 2022-07-10 VITALS — BP 126/72 | HR 95 | Resp 18 | Ht 67.0 in | Wt 115.0 lb

## 2022-07-10 DIAGNOSIS — J209 Acute bronchitis, unspecified: Secondary | ICD-10-CM

## 2022-07-10 DIAGNOSIS — W19XXXA Unspecified fall, initial encounter: Secondary | ICD-10-CM

## 2022-07-10 DIAGNOSIS — J440 Chronic obstructive pulmonary disease with acute lower respiratory infection: Secondary | ICD-10-CM

## 2022-07-10 DIAGNOSIS — J0110 Acute frontal sinusitis, unspecified: Secondary | ICD-10-CM | POA: Diagnosis not present

## 2022-07-10 MED ORDER — ALBUTEROL SULFATE HFA 108 (90 BASE) MCG/ACT IN AERS: 2.0000 | INHALATION_SPRAY | Freq: Four times a day (QID) | RESPIRATORY_TRACT | 0 refills | Status: DC | PRN

## 2022-07-10 MED ORDER — PROMETHAZINE-DM 6.25-15 MG/5ML PO SYRP: 5.0000 mL | ORAL_SOLUTION | Freq: Four times a day (QID) | ORAL | 0 refills | Status: DC | PRN

## 2022-07-10 MED ORDER — AMOXICILLIN-POT CLAVULANATE 875-125 MG PO TABS: 1.0000 | ORAL_TABLET | Freq: Two times a day (BID) | ORAL | 0 refills | Status: DC

## 2022-07-10 NOTE — Progress Notes (Signed)
Acute Office Visit  Subjective:    Patient ID: Donald Stewart, male    DOB: Jan 14, 1971, 51 y.o.   MRN: [id number]  Chief Complaint  Patient presents with   Cough    Patient has had cough congestion runny nose since 06-30-22 had a fall the same day has tried otc tylenol has not had covid test     HPI Patient is in today for complaint of nasal congestion, cough, postnasal drip and fatigue for the last 10 days.  She denies any fever, chills, nausea, vomiting or diarrhea.  He has cough with clear sputum.  He has tried taking Tylenol with no relief.  He reports that multiple coworkers have had similar symptoms.  He denies any dyspnea or wheezing.  Of note, he takes alcohol on a daily basis and also smokes about 3-4 cigarettes per day.  He reports having a fall from a step stool on his back.  He was under the effect of alcohol at that time.  He had mild right shoulder injury.  He reports that his shoulder pain has improved now.  No major bruising reported.  Past Medical History:  Diagnosis Date   Asthma    Avascular necrosis of hip, right (HCC) 06/08/2021   Hypercholesterolemia    Hypertension     Past Surgical History:  Procedure Laterality Date   TOTAL HIP ARTHROPLASTY Right     Family History  Problem Relation Age of Onset   Hypertension Mother     Social History   Socioeconomic History   Marital status: Divorced    Spouse name: Not on file   Number of children: Not on file   Years of education: Not on file   Highest education level: Not on file  Occupational History   Not on file  Tobacco Use   Smoking status: Former    Packs/day: 0.50    Years: 20.00    Total pack years: 10.00    Types: Cigarettes   Smokeless tobacco: Never  Vaping Use   Vaping Use: Never used  Substance and Sexual Activity   Alcohol use: Yes    Comment: drinks until he passes out daily. has no desire to stop   Drug use: No   Sexual activity: Not on file  Other Topics Concern   Not on  file  Social History Narrative   Not on file   Social Determinants of Health   Financial Resource Strain: Not on file  Food Insecurity: Not on file  Transportation Needs: Not on file  Physical Activity: Not on file  Stress: Not on file  Social Connections: Not on file  Intimate Partner Violence: Not on file    Outpatient Medications Prior to Visit  Medication Sig Dispense Refill   amLODipine (NORVASC) 5 MG tablet Take 1 tablet (5 mg total) by mouth daily. 90 tablet 1   cetirizine (ZYRTEC) 10 MG tablet Take 1 tablet (10 mg total) by mouth daily. 30 tablet 11   fluticasone (FLONASE) 50 MCG/ACT nasal spray Place 2 sprays into both nostrils daily. 16 g 6   potassium chloride SA (KLOR-CON M) 20 MEQ tablet Take 1 tablet (20 mEq total) by mouth daily. 30 tablet 5   pravastatin (PRAVACHOL) 40 MG tablet TAKE 1 TABLET BY MOUTH DAILY 90 tablet 1   No facility-administered medications prior to visit.    No Known Allergies  Review of Systems  Constitutional:  Positive for unexpected weight change. Negative for chills and fever.  HENT:  Positive for congestion, postnasal drip and sinus pressure. Negative for sore throat.   Eyes:  Negative for pain and discharge.  Respiratory:  Positive for cough. Negative for shortness of breath.   Cardiovascular:  Negative for chest pain and palpitations.  Gastrointestinal:  Negative for constipation, diarrhea, nausea and vomiting.  Endocrine: Negative for polydipsia and polyuria.  Genitourinary:  Negative for dysuria and hematuria.  Musculoskeletal:  Negative for neck pain and neck stiffness.  Skin:  Negative for rash.  Neurological:  Negative for dizziness, weakness, numbness and headaches.  Psychiatric/Behavioral:  Negative for agitation and behavioral problems.        Objective:    Physical Exam Vitals reviewed.  Constitutional:      General: He is not in acute distress.    Appearance: He is underweight. He is not diaphoretic.  HENT:      Head: Normocephalic and atraumatic.     Nose: Congestion and rhinorrhea present.     Mouth/Throat:     Mouth: Mucous membranes are moist.  Eyes:     General: No scleral icterus.    Extraocular Movements: Extraocular movements intact.  Cardiovascular:     Rate and Rhythm: Normal rate and regular rhythm.     Pulses: Normal pulses.     Heart sounds: Normal heart sounds. No murmur heard. Pulmonary:     Breath sounds: Normal breath sounds. No rales.     Comments: Rhonchi b/l Musculoskeletal:     Cervical back: Neck supple. No tenderness.     Right lower leg: No edema.     Left lower leg: No edema.  Skin:    General: Skin is warm.     Findings: No rash.  Neurological:     General: No focal deficit present.     Mental Status: He is alert and oriented to person, place, and time.     Cranial Nerves: No cranial nerve deficit.     Sensory: No sensory deficit.     Motor: No weakness.  Psychiatric:        Mood and Affect: Mood normal.        Behavior: Behavior normal.     BP 126/72 (BP Location: Right Arm, Patient Position: Sitting, Cuff Size: Normal)   Pulse 95   Resp 18   Ht 5\' 7"  (1.702 m)   Wt 115 lb (52.2 kg)   SpO2 96%   BMI 18.01 kg/m  Wt Readings from Last 3 Encounters:  07/10/22 115 lb (52.2 kg)  07/04/22 115 lb 12.8 oz (52.5 kg)  11/09/21 125 lb 0.6 oz (56.7 kg)        Assessment & Plan:   Problem List Items Addressed This Visit       Respiratory   Acute bronchitis with COPD (HCC) - Primary    Persistent symptoms for at least 10 days Started Augmentin to cover for anaerobic bacteria, as he has h/o alcohol abuse - has risk of aspiration pneumonia Promethazine DM syrup PRN for cough Albuterol PRN for dyspnea or wheezing Needs to cut down alcohol use and quit smoking       Relevant Medications   promethazine-dextromethorphan (PROMETHAZINE-DM) 6.25-15 MG/5ML syrup   albuterol (VENTOLIN HFA) 108 (90 Base) MCG/ACT inhaler   Other Visit Diagnoses     Acute  non-recurrent frontal sinusitis       Relevant Medications   amoxicillin-clavulanate (AUGMENTIN) 875-125 MG tablet   promethazine-dextromethorphan (PROMETHAZINE-DM) 6.25-15 MG/5ML syrup   Fall, initial encounter    About 10 days ago  Under the influence of alcohol  Had right shoulder injury - pain improved now No major injury, including head injury        Meds ordered this encounter  Medications   amoxicillin-clavulanate (AUGMENTIN) 875-125 MG tablet    Sig: Take 1 tablet by mouth 2 (two) times daily.    Dispense:  14 tablet    Refill:  0   promethazine-dextromethorphan (PROMETHAZINE-DM) 6.25-15 MG/5ML syrup    Sig: Take 5 mLs by mouth 4 (four) times daily as needed for cough.    Dispense:  118 mL    Refill:  0   albuterol (VENTOLIN HFA) 108 (90 Base) MCG/ACT inhaler    Sig: Inhale 2 puffs into the lungs every 6 (six) hours as needed for wheezing or shortness of breath.    Dispense:  8 g    Refill:  0    Okay to substitute to generic/formulary Albuterol.     Anabel Halon, MD

## 2022-07-10 NOTE — Assessment & Plan Note (Signed)
Persistent symptoms for at least 10 days Started Augmentin to cover for anaerobic bacteria, as he has h/o alcohol abuse - has risk of aspiration pneumonia Promethazine DM syrup PRN for cough Albuterol PRN for dyspnea or wheezing Needs to cut down alcohol use and quit smoking

## 2022-07-10 NOTE — Patient Instructions (Signed)
Please take Augmentin as prescribed.  Please take Promethazine-DM syrup for cough.  Please use Albuterol inhaler as needed for shortness of breath or wheezing.

## 2022-11-14 ENCOUNTER — Encounter: Payer: Self-pay | Admitting: Internal Medicine

## 2022-11-14 ENCOUNTER — Ambulatory Visit (INDEPENDENT_AMBULATORY_CARE_PROVIDER_SITE_OTHER): Payer: BC Managed Care – PPO | Admitting: Internal Medicine

## 2022-11-14 VITALS — BP 128/70 | HR 99 | Ht 67.0 in | Wt 118.4 lb

## 2022-11-14 DIAGNOSIS — Z0001 Encounter for general adult medical examination with abnormal findings: Secondary | ICD-10-CM | POA: Diagnosis not present

## 2022-11-14 DIAGNOSIS — Z23 Encounter for immunization: Secondary | ICD-10-CM | POA: Diagnosis not present

## 2022-11-14 DIAGNOSIS — I10 Essential (primary) hypertension: Secondary | ICD-10-CM

## 2022-11-14 DIAGNOSIS — E441 Mild protein-calorie malnutrition: Secondary | ICD-10-CM

## 2022-11-14 DIAGNOSIS — E782 Mixed hyperlipidemia: Secondary | ICD-10-CM

## 2022-11-14 DIAGNOSIS — F1029 Alcohol dependence with unspecified alcohol-induced disorder: Secondary | ICD-10-CM

## 2022-11-14 DIAGNOSIS — E559 Vitamin D deficiency, unspecified: Secondary | ICD-10-CM

## 2022-11-14 DIAGNOSIS — Z720 Tobacco use: Secondary | ICD-10-CM

## 2022-11-14 DIAGNOSIS — E876 Hypokalemia: Secondary | ICD-10-CM

## 2022-11-14 MED ORDER — POTASSIUM CHLORIDE CRYS ER 20 MEQ PO TBCR: 20.0000 meq | EXTENDED_RELEASE_TABLET | Freq: Every day | ORAL | 1 refills | Status: DC

## 2022-11-14 MED ORDER — AMLODIPINE BESYLATE 5 MG PO TABS: 5.0000 mg | ORAL_TABLET | Freq: Every day | ORAL | 1 refills | Status: DC

## 2022-11-14 MED ORDER — VITAMIN B-1 50 MG PO TABS: 50.0000 mg | ORAL_TABLET | Freq: Every day | ORAL | 2 refills | Status: DC

## 2022-11-14 MED ORDER — FOLIC ACID 1 MG PO TABS
1.0000 mg | ORAL_TABLET | Freq: Every day | ORAL | 3 refills | Status: DC
Start: 2022-11-14 — End: 2023-07-29

## 2022-11-14 MED ORDER — PRAVASTATIN SODIUM 40 MG PO TABS
ORAL_TABLET | ORAL | 1 refills | Status: DC
Start: 2022-11-14 — End: 2023-11-05

## 2022-11-14 NOTE — Assessment & Plan Note (Signed)
BP Readings from Last 1 Encounters:  11/14/22 128/70   Well-controlled with amlodipine Counseled for compliance with the medications Advised DASH diet and moderate exercise/walking, at least 150 mins/week

## 2022-11-14 NOTE — Assessment & Plan Note (Signed)
Smokes about 0.5 pack/day  Asked about quitting: confirms that he/she currently smokes cigarettes Advise to quit smoking: Educated about QUITTING to reduce the risk of cancer, cardio and cerebrovascular disease. Assess willingness: Unwilling to quit at this time, but is working on cutting back. Assist with counseling and pharmacotherapy: Counseled for 5 minutes and literature provided. Arrange for follow up: follow up and continue to offer help.

## 2022-11-14 NOTE — Progress Notes (Signed)
Established Patient Office Visit  Subjective:  Patient ID: Donald Stewart, male    DOB: 05/27/1971  Age: 52 y.o. MRN: [id number]  CC:  Chief Complaint  Patient presents with   Annual Exam    HPI Donald Stewart is a 52 y.o. male with past medical history of HTN, HLD, alcohol and tobacco abuse who presents for annual physical. His mother is present during the visit.  HTN: BP is well-controlled. Takes medications regularly. Patient denies headache, dizziness, chest pain, dyspnea or palpitations.  He has been smoking 0.5 pack/day now. He takes about 6-8 alcoholic drinks (vodka) in a day. He is not willing to decrease alcohol and smoking despite counseling. He denies to join any support group. He denies any fever, chills, decreased appetite, nausea, vomiting, hemoptysis or chronic cough, night sweats, LAD, diarrhea.   He had first dose of Shingrix vaccines in the office today.  Past Medical History:  Diagnosis Date   Asthma    Avascular necrosis of hip, right ([place]) 06/08/2021   Hypercholesterolemia    Hypertension     Past Surgical History:  Procedure Laterality Date   TOTAL HIP ARTHROPLASTY Right     Family History  Problem Relation Age of Onset   Hypertension Mother     Social History   Socioeconomic History   Marital status: Divorced    Spouse name: Not on file   Number of children: Not on file   Years of education: Not on file   Highest education level: Not on file  Occupational History   Not on file  Tobacco Use   Smoking status: Former    Packs/day: 0.50    Years: 20.00    Total pack years: 10.00    Types: Cigarettes   Smokeless tobacco: Never  Vaping Use   Vaping Use: Never used  Substance and Sexual Activity   Alcohol use: Yes    Comment: drinks until he passes out daily. has no desire to stop   Drug use: No   Sexual activity: Not on file  Other Topics Concern   Not on file  Social History Narrative   Not on file   Social Determinants  of Health   Financial Resource Strain: Not on file  Food Insecurity: Not on file  Transportation Needs: Not on file  Physical Activity: Not on file  Stress: Not on file  Social Connections: Not on file  Intimate Partner Violence: Not on file    Outpatient Medications Prior to Visit  Medication Sig Dispense Refill   albuterol (VENTOLIN HFA) 108 (90 Base) MCG/ACT inhaler Inhale 2 puffs into the lungs every 6 (six) hours as needed for wheezing or shortness of breath. 8 g 0   cetirizine (ZYRTEC) 10 MG tablet Take 1 tablet (10 mg total) by mouth daily. 30 tablet 11   fluticasone (FLONASE) 50 MCG/ACT nasal spray Place 2 sprays into both nostrils daily. 16 g 6   promethazine-dextromethorphan (PROMETHAZINE-DM) 6.25-15 MG/5ML syrup Take 5 mLs by mouth 4 (four) times daily as needed for cough. 118 mL 0   amLODipine (NORVASC) 5 MG tablet Take 1 tablet (5 mg total) by mouth daily. 90 tablet 1   amoxicillin-clavulanate (AUGMENTIN) 875-125 MG tablet Take 1 tablet by mouth 2 (two) times daily. 14 tablet 0   potassium chloride SA (KLOR-CON M) 20 MEQ tablet Take 1 tablet (20 mEq total) by mouth daily. 30 tablet 5   pravastatin (PRAVACHOL) 40 MG tablet TAKE 1 TABLET BY MOUTH DAILY 90 tablet  1   No facility-administered medications prior to visit.    No Known Allergies  ROS Review of Systems  Constitutional:  Negative for chills and fever.  HENT:  Negative for congestion and sore throat.   Eyes:  Negative for pain and discharge.  Respiratory:  Negative for cough and shortness of breath.   Cardiovascular:  Negative for chest pain and palpitations.  Gastrointestinal:  Negative for constipation, diarrhea, nausea and vomiting.  Endocrine: Negative for polydipsia and polyuria.  Genitourinary:  Negative for dysuria and hematuria.  Musculoskeletal:  Negative for neck pain and neck stiffness.  Skin:  Negative for rash.  Neurological:  Negative for dizziness, weakness, numbness and headaches.   Psychiatric/Behavioral:  Negative for agitation and behavioral problems.       Objective:    Physical Exam Vitals reviewed.  Constitutional:      General: He is not in acute distress.    Appearance: He is not diaphoretic.  HENT:     Head: Normocephalic and atraumatic.     Nose: Nose normal.     Mouth/Throat:     Mouth: Mucous membranes are moist.  Eyes:     General: No scleral icterus.    Extraocular Movements: Extraocular movements intact.  Cardiovascular:     Rate and Rhythm: Normal rate and regular rhythm.     Pulses: Normal pulses.     Heart sounds: Normal heart sounds. No murmur heard. Pulmonary:     Breath sounds: Normal breath sounds. No wheezing or rales.  Abdominal:     Palpations: Abdomen is soft.     Tenderness: There is no abdominal tenderness.  Musculoskeletal:     Cervical back: Neck supple. No tenderness.     Right lower leg: No edema.     Left lower leg: No edema.  Skin:    General: Skin is warm.     Findings: No rash.  Neurological:     General: No focal deficit present.     Mental Status: He is alert and oriented to person, place, and time.     Cranial Nerves: No cranial nerve deficit.     Sensory: No sensory deficit.     Motor: No weakness.  Psychiatric:        Mood and Affect: Mood normal.        Behavior: Behavior normal.     BP 128/70 (BP Location: Right Arm, Cuff Size: Normal)   Pulse 99   Ht _0  (1.702 m)   Wt 118 lb 6.4 oz (53.7 kg)   SpO2 96%   BMI 18.54 kg/m  Wt Readings from Last 3 Encounters:  11/14/22 118 lb 6.4 oz (53.7 kg)  07/10/22 115 lb (52.2 kg)  07/04/22 115 lb 12.8 oz (52.5 kg)    Lab Results  Component Value Date   TSH 2.330 06/08/2021   Lab Results  Component Value Date   WBC 10.2 07/04/2022   HGB 11.0 (L) 07/04/2022   HCT 30.6 (L) 07/04/2022   MCV 100 (H) 07/04/2022   PLT 127 (L) 07/04/2022   Lab Results  Component Value Date   NA 135 07/04/2022   K 2.9 (L) 07/04/2022   CO2 31 (H) 07/04/2022    GLUCOSE 100 (H) 07/04/2022   BUN 6 07/04/2022   CREATININE 0.75 (L) 07/04/2022   BILITOT 1.0 07/04/2022   ALKPHOS 85 07/04/2022   AST 51 (H) 07/04/2022   ALT 13 07/04/2022   PROT 7.3 07/04/2022   ALBUMIN 4.3 07/04/2022   CALCIUM  8.9 07/04/2022   ANIONGAP 16 (H) 08/04/2020   EGFR 109 07/04/2022   Lab Results  Component Value Date   CHOL 204 (H) 11/09/2021   Lab Results  Component Value Date   HDL 94 11/09/2021   Lab Results  Component Value Date   LDLCALC 65 11/09/2021   Lab Results  Component Value Date   TRIG 296 (H) 11/09/2021   Lab Results  Component Value Date   CHOLHDL 2.2 11/09/2021   Lab Results  Component Value Date   HGBA1C 5.2 06/08/2021      Assessment & Plan:   Problem List Items Addressed This Visit       Cardiovascular and Mediastinum   Primary hypertension    BP Readings from Last 1 Encounters:  11/14/22 128/70  Well-controlled with amlodipine Counseled for compliance with the medications Advised DASH diet and moderate exercise/walking, at least 150 mins/week      Relevant Medications   amLODipine (NORVASC) 5 MG tablet   pravastatin (PRAVACHOL) 40 MG tablet   Other Relevant Orders   TSH   CMP14+EGFR   CBC with Differential/Platelet     Other   Tobacco abuse    Smokes about 0.5 pack/day  Asked about quitting: confirms that he/she currently smokes cigarettes Advise to quit smoking: Educated about QUITTING to reduce the risk of cancer, cardio and cerebrovascular disease. Assess willingness: Unwilling to quit at this time, but is working on cutting back. Assist with counseling and pharmacotherapy: Counseled for 5 minutes and literature provided. Arrange for follow up: follow up and continue to offer help.      Mixed hyperlipidemia    On Pravastatin Check lipid profile      Relevant Medications   amLODipine (NORVASC) 5 MG tablet   pravastatin (PRAVACHOL) 40 MG tablet   Other Relevant Orders   Lipid panel   Encounter for  general adult medical examination with abnormal findings - Primary    Physical exam as documented. Counseling done  re healthy lifestyle involving commitment to 150 minutes exercise per week, heart healthy diet, and attaining healthy weight.The importance of adequate sleep also discussed. Changes in health habits are decided on by the patient with goals and time frames  set for achieving them. Immunization and cancer screening needs are specifically addressed at this visit.      Mild protein-calorie malnutrition ([place])    BMI Readings from Last 3 Encounters:  11/14/22 18.54 kg/m  07/10/22 18.01 kg/m  07/04/22 18.14 kg/m  Likely due to alcohol abuse, needs to cut down Advised to eat at regular intervals Advised to take protein supplements      Relevant Medications   folic acid (FOLVITE) 1 MG tablet   thiamine (VITAMIN B-1) 50 MG tablet   Other Relevant Orders   TSH   CMP14+EGFR   Alcohol dependence with unspecified alcohol-induced disorder ([place])    Takes about 6-8 alcoholic drinks in a day Needs to cut down, but he is not willing despite counseling AA support groups information provided      Relevant Medications   folic acid (FOLVITE) 1 MG tablet   thiamine (VITAMIN B-1) 50 MG tablet   Other Relevant Orders   CMP14+EGFR   Hypokalemia   Relevant Medications   potassium chloride SA (KLOR-CON M) 20 MEQ tablet   Other Relevant Orders   CMP14+EGFR   Other Visit Diagnoses     Vitamin D deficiency       Relevant Orders   VITAMIN D 25 Hydroxy (Vit-D Deficiency,  Fractures)       Meds ordered this encounter  Medications   amLODipine (NORVASC) 5 MG tablet    Sig: Take 1 tablet (5 mg total) by mouth daily.    Dispense:  90 tablet    Refill:  1   pravastatin (PRAVACHOL) 40 MG tablet    Sig: TAKE 1 TABLET BY MOUTH DAILY    Dispense:  90 tablet    Refill:  1   potassium chloride SA (KLOR-CON M) 20 MEQ tablet    Sig: Take 1 tablet (20 mEq total) by mouth daily.     Dispense:  90 tablet    Refill:  1   folic acid (FOLVITE) 1 MG tablet    Sig: Take 1 tablet (1 mg total) by mouth daily.    Dispense:  90 tablet    Refill:  3   thiamine (VITAMIN B-1) 50 MG tablet    Sig: Take 1 tablet (50 mg total) by mouth daily.    Dispense:  100 tablet    Refill:  2    Follow-up: Return in about 6 months (around 05/15/2023) for HTN and HLD.    Lindell Spar, MD

## 2022-11-14 NOTE — Patient Instructions (Signed)
Please continue taking medications as prescribed.  Please continue to follow low salt diet and ambulate as tolerated.  Please try to cut down alcohol intake soon. You can also join [facility] groups for support.

## 2022-11-14 NOTE — Assessment & Plan Note (Signed)

## 2022-11-14 NOTE — Assessment & Plan Note (Signed)
On Pravastatin Check lipid profile 

## 2022-11-14 NOTE — Assessment & Plan Note (Signed)
Takes about 6-8 alcoholic drinks in a day Needs to cut down, but he is not willing despite counseling AA support groups information provided

## 2022-11-14 NOTE — Assessment & Plan Note (Signed)
BMI Readings from Last 3 Encounters:  11/14/22 18.54 kg/m  07/10/22 18.01 kg/m  07/04/22 18.14 kg/m   Likely due to alcohol abuse, needs to cut down Advised to eat at regular intervals Advised to take protein supplements

## 2022-11-14 NOTE — Addendum Note (Signed)
Addended by: Everett Graff D on: 11/14/2022 09:55 AM   Modules accepted: Orders

## 2022-11-15 ENCOUNTER — Other Ambulatory Visit: Payer: Self-pay | Admitting: Internal Medicine

## 2022-11-15 DIAGNOSIS — E559 Vitamin D deficiency, unspecified: Secondary | ICD-10-CM

## 2022-11-15 LAB — CBC WITH DIFFERENTIAL/PLATELET
Basophils Absolute: 0 10*3/uL (ref 0.0–0.2)
Basos: 1 %
EOS (ABSOLUTE): 0 10*3/uL (ref 0.0–0.4)
Eos: 0 %
Hematocrit: 35.4 % — ABNORMAL LOW (ref 37.5–51.0)
Hemoglobin: 12.1 g/dL — ABNORMAL LOW (ref 13.0–17.7)
Immature Grans (Abs): 0 10*3/uL (ref 0.0–0.1)
Immature Granulocytes: 0 %
Lymphocytes Absolute: 1 10*3/uL (ref 0.7–3.1)
Lymphs: 23 %
MCH: 35.4 pg — ABNORMAL HIGH (ref 26.6–33.0)
MCHC: 34.2 g/dL (ref 31.5–35.7)
MCV: 104 fL — ABNORMAL HIGH (ref 79–97)
Monocytes Absolute: 0.6 10*3/uL (ref 0.1–0.9)
Monocytes: 13 %
Neutrophils Absolute: 2.9 10*3/uL (ref 1.4–7.0)
Neutrophils: 63 %
Platelets: 158 10*3/uL (ref 150–450)
RBC: 3.42 x10E6/uL — ABNORMAL LOW (ref 4.14–5.80)
RDW: 12 % (ref 11.6–15.4)
WBC: 4.6 10*3/uL (ref 3.4–10.8)

## 2022-11-15 LAB — CMP14+EGFR
ALT: 31 IU/L (ref 0–44)
AST: 101 IU/L — ABNORMAL HIGH (ref 0–40)
Albumin/Globulin Ratio: 1.7 (ref 1.2–2.2)
Albumin: 4.7 g/dL (ref 3.8–4.9)
Alkaline Phosphatase: 116 IU/L (ref 44–121)
BUN/Creatinine Ratio: 11 (ref 9–20)
BUN: 8 mg/dL (ref 6–24)
Bilirubin Total: 0.9 mg/dL (ref 0.0–1.2)
CO2: 27 mmol/L (ref 20–29)
Calcium: 9.2 mg/dL (ref 8.7–10.2)
Chloride: 91 mmol/L — ABNORMAL LOW (ref 96–106)
Creatinine, Ser: 0.7 mg/dL — ABNORMAL LOW (ref 0.76–1.27)
Globulin, Total: 2.8 g/dL (ref 1.5–4.5)
Glucose: 81 mg/dL (ref 70–99)
Potassium: 3 mmol/L — ABNORMAL LOW (ref 3.5–5.2)
Sodium: 140 mmol/L (ref 134–144)
Total Protein: 7.5 g/dL (ref 6.0–8.5)
eGFR: 111 mL/min/{1.73_m2} (ref >59)

## 2022-11-15 LAB — LIPID PANEL
Chol/HDL Ratio: 2 ratio (ref 0.0–5.0)
Cholesterol, Total: 174 mg/dL (ref 100–199)
HDL: 85 mg/dL (ref >39)
LDL Chol Calc (NIH): 61 mg/dL (ref 0–99)
Triglycerides: 171 mg/dL — ABNORMAL HIGH (ref 0–149)
VLDL Cholesterol Cal: 28 mg/dL (ref 5–40)

## 2022-11-15 LAB — VITAMIN D 25 HYDROXY (VIT D DEFICIENCY, FRACTURES): Vit D, 25-Hydroxy: 9.7 ng/mL — ABNORMAL LOW (ref 30.0–100.0)

## 2022-11-15 LAB — TSH: TSH: 1.26 u[IU]/mL (ref 0.450–4.500)

## 2022-11-15 MED ORDER — VITAMIN D (ERGOCALCIFEROL) 1.25 MG (50000 UNIT) PO CAPS: 50000.0000 [IU] | ORAL_CAPSULE | ORAL | 1 refills | Status: DC

## 2023-03-27 ENCOUNTER — Ambulatory Visit: Payer: BC Managed Care – PPO | Admitting: Internal Medicine

## 2023-03-27 ENCOUNTER — Encounter: Payer: Self-pay | Admitting: Internal Medicine

## 2023-03-27 VITALS — BP 136/89 | HR 88 | Ht 67.0 in | Wt 111.2 lb

## 2023-03-27 DIAGNOSIS — I10 Essential (primary) hypertension: Secondary | ICD-10-CM | POA: Diagnosis not present

## 2023-03-27 DIAGNOSIS — E782 Mixed hyperlipidemia: Secondary | ICD-10-CM

## 2023-03-27 DIAGNOSIS — E876 Hypokalemia: Secondary | ICD-10-CM

## 2023-03-27 DIAGNOSIS — J449 Chronic obstructive pulmonary disease, unspecified: Secondary | ICD-10-CM | POA: Diagnosis not present

## 2023-03-27 DIAGNOSIS — E440 Moderate protein-calorie malnutrition: Secondary | ICD-10-CM

## 2023-03-27 DIAGNOSIS — F1029 Alcohol dependence with unspecified alcohol-induced disorder: Secondary | ICD-10-CM | POA: Diagnosis not present

## 2023-03-27 DIAGNOSIS — D538 Other specified nutritional anemias: Secondary | ICD-10-CM

## 2023-03-27 NOTE — Assessment & Plan Note (Signed)
BP Readings from Last 1 Encounters:  03/27/23 136/89   Well-controlled with amlodipine Counseled for compliance with the medications Advised DASH diet and moderate exercise/walking, at least 150 mins/week

## 2023-03-27 NOTE — Assessment & Plan Note (Signed)
BMI Readings from Last 3 Encounters:  03/27/23 17.42 kg/m  11/14/22 18.54 kg/m  07/10/22 18.01 kg/m   Likely due to alcohol abuse, needs to cut down Advised to eat at regular intervals Advised to take protein supplements

## 2023-03-27 NOTE — Progress Notes (Signed)
Established Patient Office Visit  Subjective:  Patient ID: Donald Stewart, male    DOB: August 02, 1971  Age: 52 y.o. MRN: [id number]  CC:  Chief Complaint  Patient presents with   Hypertension    HPI Donald Stewart is a 52 y.o. male with past medical history of HTN, HLD, alcohol and tobacco abuse who presents for f/u of his chronic medical conditions. Her mother is present during the visit.  HTN: BP is well-controlled. Takes medications regularly. Patient denies headache, dizziness, chest pain, dyspnea or palpitations.  He has been smoking about 0.5 pack/day now. He takes about 3-4 alcoholic drinks (vodka) and a bear in a day, which is better than 6-8 drinks in a day in the past. He is not willing to decrease alcohol and smoking despite counseling. He denies to join any support group. He has lost about 7 lbs since the last visit.  He denies any fever, chills, decreased appetite, nausea, vomiting, hemoptysis or chronic cough, night sweats, LAD, diarrhea.  He has started taking thiamine, folic acid and potassium supplement now.   Past Medical History:  Diagnosis Date   Asthma    Avascular necrosis of hip, right (HCC) 06/08/2021   Hypercholesterolemia    Hypertension     Past Surgical History:  Procedure Laterality Date   TOTAL HIP ARTHROPLASTY Right     Family History  Problem Relation Age of Onset   Hypertension Mother     Social History   Socioeconomic History   Marital status: Divorced    Spouse name: Not on file   Number of children: Not on file   Years of education: Not on file   Highest education level: Not on file  Occupational History   Not on file  Tobacco Use   Smoking status: Former    Packs/day: 0.50    Years: 20.00    Additional pack years: 0.00    Total pack years: 10.00    Types: Cigarettes   Smokeless tobacco: Never  Vaping Use   Vaping Use: Never used  Substance and Sexual Activity   Alcohol use: Yes    Comment: drinks until he passes  out daily. has no desire to stop   Drug use: No   Sexual activity: Not on file  Other Topics Concern   Not on file  Social History Narrative   Not on file   Social Determinants of Health   Financial Resource Strain: Not on file  Food Insecurity: Not on file  Transportation Needs: Not on file  Physical Activity: Not on file  Stress: Not on file  Social Connections: Not on file  Intimate Partner Violence: Not on file    Outpatient Medications Prior to Visit  Medication Sig Dispense Refill   albuterol (VENTOLIN HFA) 108 (90 Base) MCG/ACT inhaler Inhale 2 puffs into the lungs every 6 (six) hours as needed for wheezing or shortness of breath. 8 g 0   amLODipine (NORVASC) 5 MG tablet Take 1 tablet (5 mg total) by mouth daily. 90 tablet 1   cetirizine (ZYRTEC) 10 MG tablet Take 1 tablet (10 mg total) by mouth daily. 30 tablet 11   fluticasone (FLONASE) 50 MCG/ACT nasal spray Place 2 sprays into both nostrils daily. 16 g 6   folic acid (FOLVITE) 1 MG tablet Take 1 tablet (1 mg total) by mouth daily. 90 tablet 3   potassium chloride SA (KLOR-CON M) 20 MEQ tablet Take 1 tablet (20 mEq total) by mouth daily. 90 tablet 1  pravastatin (PRAVACHOL) 40 MG tablet TAKE 1 TABLET BY MOUTH DAILY 90 tablet 1   promethazine-dextromethorphan (PROMETHAZINE-DM) 6.25-15 MG/5ML syrup Take 5 mLs by mouth 4 (four) times daily as needed for cough. 118 mL 0   thiamine (VITAMIN B-1) 50 MG tablet Take 1 tablet (50 mg total) by mouth daily. 100 tablet 2   Vitamin D, Ergocalciferol, (DRISDOL) 1.25 MG (50000 UNIT) CAPS capsule Take 1 capsule (50,000 Units total) by mouth every 7 (seven) days. 12 capsule 1   No facility-administered medications prior to visit.    No Known Allergies  ROS Review of Systems  Constitutional:  Positive for unexpected weight change. Negative for chills and fever.  HENT:  Negative for congestion, postnasal drip, sinus pressure and sore throat.   Eyes:  Negative for pain and discharge.   Respiratory:  Negative for cough and shortness of breath.   Cardiovascular:  Negative for chest pain and palpitations.  Gastrointestinal:  Negative for constipation, diarrhea, nausea and vomiting.  Endocrine: Negative for polydipsia and polyuria.  Genitourinary:  Negative for dysuria and hematuria.  Musculoskeletal:  Negative for neck pain and neck stiffness.  Skin:  Negative for rash.  Neurological:  Negative for dizziness, weakness, numbness and headaches.  Psychiatric/Behavioral:  Negative for agitation and behavioral problems.       Objective:    Physical Exam Vitals reviewed.  Constitutional:      General: He is not in acute distress.    Appearance: He is cachectic. He is not diaphoretic.  HENT:     Head: Normocephalic and atraumatic.     Nose: No congestion.     Mouth/Throat:     Mouth: Mucous membranes are moist.  Eyes:     General: No scleral icterus.    Extraocular Movements: Extraocular movements intact.  Cardiovascular:     Rate and Rhythm: Normal rate and regular rhythm.     Pulses: Normal pulses.     Heart sounds: Normal heart sounds. No murmur heard. Pulmonary:     Breath sounds: Normal breath sounds. No wheezing or rales.  Musculoskeletal:     Cervical back: Neck supple. No tenderness.     Right lower leg: No edema.     Left lower leg: No edema.  Skin:    General: Skin is warm.     Findings: No rash.  Neurological:     General: No focal deficit present.     Mental Status: He is alert and oriented to person, place, and time.     Cranial Nerves: No cranial nerve deficit.     Sensory: No sensory deficit.     Motor: No weakness.  Psychiatric:        Mood and Affect: Mood normal.        Behavior: Behavior normal.     BP 136/89 (BP Location: Right Arm, Patient Position: Sitting, Cuff Size: Normal)   Pulse 88   Ht 5\' 7"  (1.702 m)   Wt 111 lb 3.2 oz (50.4 kg)   SpO2 95%   BMI 17.42 kg/m  Wt Readings from Last 3 Encounters:  03/27/23 111 lb 3.2 oz  (50.4 kg)  11/14/22 118 lb 6.4 oz (53.7 kg)  07/10/22 115 lb (52.2 kg)    Lab Results  Component Value Date   TSH 1.260 11/14/2022   Lab Results  Component Value Date   WBC 4.6 11/14/2022   HGB 12.1 (L) 11/14/2022   HCT 35.4 (L) 11/14/2022   MCV 104 (H) 11/14/2022   PLT 158  11/14/2022   Lab Results  Component Value Date   NA 140 11/14/2022   K 3.0 (L) 11/14/2022   CO2 27 11/14/2022   GLUCOSE 81 11/14/2022   BUN 8 11/14/2022   CREATININE 0.70 (L) 11/14/2022   BILITOT 0.9 11/14/2022   ALKPHOS 116 11/14/2022   AST 101 (H) 11/14/2022   ALT 31 11/14/2022   PROT 7.5 11/14/2022   ALBUMIN 4.7 11/14/2022   CALCIUM 9.2 11/14/2022   ANIONGAP 16 (H) 08/04/2020   EGFR 111 11/14/2022   Lab Results  Component Value Date   CHOL 174 11/14/2022   Lab Results  Component Value Date   HDL 85 11/14/2022   Lab Results  Component Value Date   LDLCALC 61 11/14/2022   Lab Results  Component Value Date   TRIG 171 (H) 11/14/2022   Lab Results  Component Value Date   CHOLHDL 2.0 11/14/2022   Lab Results  Component Value Date   HGBA1C 5.2 06/08/2021      Assessment & Plan:   Problem List Items Addressed This Visit       Cardiovascular and Mediastinum   Primary hypertension - Primary    BP Readings from Last 1 Encounters:  03/27/23 136/89  Well-controlled with amlodipine Counseled for compliance with the medications Advised DASH diet and moderate exercise/walking, at least 150 mins/week      Relevant Orders   CBC with Differential/Platelet   Basic Metabolic Panel (BMET)     Respiratory   Chronic obstructive pulmonary disease (HCC)    Well-controlled Albuterol PRN for dyspnea or wheezing Needs to cut down alcohol use and quit smoking        Other   Mixed hyperlipidemia    On Pravastatin Checked lipid profile      Protein-calorie malnutrition (HCC)    BMI Readings from Last 3 Encounters:  03/27/23 17.42 kg/m  11/14/22 18.54 kg/m  07/10/22 18.01 kg/m   Likely due to alcohol abuse, needs to cut down Advised to eat at regular intervals Advised to take protein supplements      Alcohol dependence with unspecified alcohol-induced disorder (HCC)    Takes about 3-4 alcoholic drinks in a day Needs to cut down, but he is not willing despite counseling AA support groups information provided      Hypokalemia    Likely due to chronic alcohol use On Klor-Con 20 mEq QD Check BMP      Relevant Orders   Basic Metabolic Panel (BMET)   Other specified nutritional anemias    Macrocytic anemia, likely due to chronic alcohol use No signs of active bleeding Needs to continue folic acid supplement      Relevant Orders   CBC with Differential/Platelet   No orders of the defined types were placed in this encounter.   Follow-up: Return in about 4 months (around 07/28/2023) for HTN and weight check.    Anabel Halon, MD

## 2023-03-27 NOTE — Assessment & Plan Note (Signed)
Well-controlled Albuterol PRN for dyspnea or wheezing Needs to cut down alcohol use and quit smoking

## 2023-03-27 NOTE — Assessment & Plan Note (Addendum)
Takes about 3-4 alcoholic drinks in a day Needs to cut down, but he is not willing despite counseling AA support groups information provided

## 2023-03-27 NOTE — Patient Instructions (Signed)
Please start taking protein supplements once daily. Please have at least 3 meals in a day.  Please continue taking Thiamine, Folic acid and multivitamin.  Please continue to take medications as prescribed.

## 2023-03-27 NOTE — Assessment & Plan Note (Addendum)
Likely due to chronic alcohol use On Klor-Con 20 mEq QD Check BMP

## 2023-03-27 NOTE — Assessment & Plan Note (Addendum)
On Pravastatin Checked lipid profile

## 2023-03-27 NOTE — Assessment & Plan Note (Signed)
Macrocytic anemia, likely due to chronic alcohol use No signs of active bleeding Needs to continue folic acid supplement

## 2023-03-28 LAB — CBC WITH DIFFERENTIAL/PLATELET
Basophils Absolute: 0 10*3/uL (ref 0.0–0.2)
Basos: 0 %
EOS (ABSOLUTE): 0 10*3/uL (ref 0.0–0.4)
Eos: 0 %
Hematocrit: 34.6 % — ABNORMAL LOW (ref 37.5–51.0)
Hemoglobin: 12.1 g/dL — ABNORMAL LOW (ref 13.0–17.7)
Immature Grans (Abs): 0 10*3/uL (ref 0.0–0.1)
Immature Granulocytes: 0 %
Lymphocytes Absolute: 1.3 10*3/uL (ref 0.7–3.1)
Lymphs: 29 %
MCH: 36.2 pg — ABNORMAL HIGH (ref 26.6–33.0)
MCHC: 35 g/dL (ref 31.5–35.7)
MCV: 104 fL — ABNORMAL HIGH (ref 79–97)
Monocytes Absolute: 0.7 10*3/uL (ref 0.1–0.9)
Monocytes: 15 %
Neutrophils Absolute: 2.5 10*3/uL (ref 1.4–7.0)
Neutrophils: 56 %
Platelets: 151 10*3/uL (ref 150–450)
RBC: 3.34 x10E6/uL — ABNORMAL LOW (ref 4.14–5.80)
RDW: 12 % (ref 11.6–15.4)
WBC: 4.5 10*3/uL (ref 3.4–10.8)

## 2023-03-28 LAB — BASIC METABOLIC PANEL
BUN/Creatinine Ratio: 10 (ref 9–20)
BUN: 6 mg/dL (ref 6–24)
CO2: 26 mmol/L (ref 20–29)
Calcium: 9.7 mg/dL (ref 8.7–10.2)
Chloride: 95 mmol/L — ABNORMAL LOW (ref 96–106)
Creatinine, Ser: 0.62 mg/dL — ABNORMAL LOW (ref 0.76–1.27)
Glucose: 99 mg/dL (ref 70–99)
Potassium: 3.8 mmol/L (ref 3.5–5.2)
Sodium: 140 mmol/L (ref 134–144)
eGFR: 115 mL/min/{1.73_m2} (ref >59)

## 2023-05-15 ENCOUNTER — Encounter: Payer: Self-pay | Admitting: Orthopaedic Surgery

## 2023-05-15 ENCOUNTER — Other Ambulatory Visit (INDEPENDENT_AMBULATORY_CARE_PROVIDER_SITE_OTHER): Payer: BC Managed Care – PPO

## 2023-05-15 ENCOUNTER — Ambulatory Visit (INDEPENDENT_AMBULATORY_CARE_PROVIDER_SITE_OTHER): Payer: BC Managed Care – PPO | Admitting: Orthopaedic Surgery

## 2023-05-15 VITALS — BP 122/88 | HR 88 | Ht 67.0 in | Wt 109.0 lb

## 2023-05-15 DIAGNOSIS — M25571 Pain in right ankle and joints of right foot: Secondary | ICD-10-CM | POA: Diagnosis not present

## 2023-05-15 DIAGNOSIS — S92251A Displaced fracture of navicular [scaphoid] of right foot, initial encounter for closed fracture: Secondary | ICD-10-CM | POA: Diagnosis not present

## 2023-05-15 DIAGNOSIS — S92151A Displaced avulsion fracture (chip fracture) of right talus, initial encounter for closed fracture: Secondary | ICD-10-CM

## 2023-05-15 NOTE — Progress Notes (Signed)
Subjective:    Patient ID: Donald Stewart, male    DOB: September 17, 1971, 52 y.o.   MRN: [id number]  HPI He took a misstep on the last step at home on July 1 and hurt his right ankle.  He went to [facility] the following day.  X-rays showed avulsion fractures of the dorsal talus and navicular bones.  CD he brought with him had no AP view.  I have reviewed the notes and X-rays.  He was given Rx for ibuprofen.  He has no other injury.  I have independently reviewed and interpreted x-rays of this patient done at another site by another physician or qualified health professional.    Review of Systems  Constitutional:  Positive for activity change.  Respiratory:  Positive for shortness of breath.   Musculoskeletal:  Positive for arthralgias, gait problem and joint swelling.  All other systems reviewed and are negative.  For Review of Systems, all other systems reviewed and are negative.  The following is a summary of the past history medically, past history surgically, known current medicines, social history and family history.  This information is gathered electronically by the computer from prior information and documentation.  I review this each visit and have found including this information at this point in the chart is beneficial and informative.   Past Medical History:  Diagnosis Date   Asthma    Avascular necrosis of hip, right (HCC) 06/08/2021   Hypercholesterolemia    Hypertension     Past Surgical History:  Procedure Laterality Date   TOTAL HIP ARTHROPLASTY Right     Current Outpatient Medications on File Prior to Visit  Medication Sig Dispense Refill   albuterol (VENTOLIN HFA) 108 (90 Base) MCG/ACT inhaler Inhale 2 puffs into the lungs every 6 (six) hours as needed for wheezing or shortness of breath. 8 g 0   amLODipine (NORVASC) 5 MG tablet Take 1 tablet (5 mg total) by mouth daily. 90 tablet 1   fluticasone (FLONASE) 50 MCG/ACT nasal spray Place 2 sprays into  both nostrils daily. 16 g 6   ibuprofen (ADVIL) 800 MG tablet Take 800 mg by mouth 3 (three) times daily.     potassium chloride SA (KLOR-CON M) 20 MEQ tablet Take 1 tablet (20 mEq total) by mouth daily. 90 tablet 1   pravastatin (PRAVACHOL) 40 MG tablet TAKE 1 TABLET BY MOUTH DAILY 90 tablet 1   promethazine-dextromethorphan (PROMETHAZINE-DM) 6.25-15 MG/5ML syrup Take 5 mLs by mouth 4 (four) times daily as needed for cough. 118 mL 0   Vitamin D, Ergocalciferol, (DRISDOL) 1.25 MG (50000 UNIT) CAPS capsule Take 1 capsule (50,000 Units total) by mouth every 7 (seven) days. 12 capsule 1   cetirizine (ZYRTEC) 10 MG tablet Take 1 tablet (10 mg total) by mouth daily. (Patient not taking: Reported on 05/15/2023) 30 tablet 11   folic acid (FOLVITE) 1 MG tablet Take 1 tablet (1 mg total) by mouth daily. (Patient not taking: Reported on 05/15/2023) 90 tablet 3   thiamine (VITAMIN B-1) 50 MG tablet Take 1 tablet (50 mg total) by mouth daily. (Patient not taking: Reported on 05/15/2023) 100 tablet 2   No current facility-administered medications on file prior to visit.    Social History   Socioeconomic History   Marital status: Divorced    Spouse name: Not on file   Number of children: Not on file   Years of education: Not on file   Highest education level: Not on file  Occupational History   Not on file  Tobacco Use   Smoking status: Former    Packs/day: 0.50    Years: 20.00    Additional pack years: 0.00    Total pack years: 10.00    Types: Cigarettes   Smokeless tobacco: Never  Vaping Use   Vaping Use: Never used  Substance and Sexual Activity   Alcohol use: Yes    Comment: drinks until he passes out daily. has no desire to stop   Drug use: No   Sexual activity: Not on file  Other Topics Concern   Not on file  Social History Narrative   Not on file   Social Determinants of Health   Financial Resource Strain: Not on file  Food Insecurity: Not on file  Transportation Needs: Not on file   Physical Activity: Not on file  Stress: Not on file  Social Connections: Not on file  Intimate Partner Violence: Not on file    Family History  Problem Relation Age of Onset   Hypertension Mother     BP 122/88   Pulse 88   Ht 5\' 7"  (1.702 m)   Wt 109 lb (49.4 kg)   BMI 17.07 kg/m   Body mass index is 17.07 kg/m.     Objective:   Physical Exam Vitals and nursing note reviewed. Exam conducted with a chaperone present.  Constitutional:      Appearance: He is well-developed.  HENT:     Head: Normocephalic and atraumatic.  Eyes:     Conjunctiva/sclera: Conjunctivae normal.     Pupils: Pupils are equal, round, and reactive to light.  Cardiovascular:     Rate and Rhythm: Normal rate and regular rhythm.  Pulmonary:     Effort: Pulmonary effort is normal.  Abdominal:     Palpations: Abdomen is soft.  Musculoskeletal:     Cervical back: Normal range of motion and neck supple.       Feet:  Skin:    General: Skin is warm and dry.  Neurological:     Mental Status: He is alert and oriented to person, place, and time.     Cranial Nerves: No cranial nerve deficit.     Motor: No abnormal muscle tone.     Coordination: Coordination normal.     Deep Tendon Reflexes: Reflexes are normal and symmetric. Reflexes normal.  Psychiatric:        Behavior: Behavior normal.        Thought Content: Thought content normal.        Judgment: Judgment normal.   AP X-rays of the right ankle were done, reported separately.        Assessment & Plan:   Encounter Diagnoses  Name Primary?   Pain in right ankle and joints of right foot Yes   Closed displaced avulsion fracture of right talus, initial encounter    Closed avulsion fracture of navicular bone of right foot, initial encounter    I have given CAM walker and instructions for use and care.  I will give note to be out of work.  If they will let him work in the boot, he can.  Return in two weeks.  X-rays then of the right  ankle.  Call if any problem.  Precautions discussed.  Electronically Signed Darreld Mclean, MD 7/3/202410:44 AM

## 2023-05-17 ENCOUNTER — Ambulatory Visit: Payer: BC Managed Care – PPO | Admitting: Internal Medicine

## 2023-05-29 ENCOUNTER — Ambulatory Visit (INDEPENDENT_AMBULATORY_CARE_PROVIDER_SITE_OTHER): Payer: BC Managed Care – PPO | Admitting: Orthopaedic Surgery

## 2023-05-29 ENCOUNTER — Other Ambulatory Visit (INDEPENDENT_AMBULATORY_CARE_PROVIDER_SITE_OTHER): Payer: BC Managed Care – PPO

## 2023-05-29 ENCOUNTER — Encounter: Payer: Self-pay | Admitting: Orthopaedic Surgery

## 2023-05-29 VITALS — BP 133/91 | HR 118 | Ht 67.0 in | Wt 109.0 lb

## 2023-05-29 DIAGNOSIS — S92151A Displaced avulsion fracture (chip fracture) of right talus, initial encounter for closed fracture: Secondary | ICD-10-CM

## 2023-05-29 DIAGNOSIS — S92151D Displaced avulsion fracture (chip fracture) of right talus, subsequent encounter for fracture with routine healing: Secondary | ICD-10-CM

## 2023-05-29 NOTE — Patient Instructions (Signed)
Return to work today no restrictions

## 2023-05-29 NOTE — Progress Notes (Signed)
I feel better.  He has been using the CAM walker and also walking without it.  He has little pain.  NV intact. ROM of the right ankle is full.  X-rays were done of the right ankle, reported separately.  Encounter Diagnosis  Name Primary?   Closed displaced avulsion fracture of right talus with routine healing, subsequent encounter Yes   He can come out of CAM walker.  He can return to work.  Return here in three weeks.  X-rays on return.  Call if any problem.  Precautions discussed.  Electronically Signed Darreld Mclean, MD 7/17/20248:20 AM

## 2023-06-12 ENCOUNTER — Encounter (HOSPITAL_COMMUNITY): Payer: Self-pay

## 2023-06-12 ENCOUNTER — Emergency Department (HOSPITAL_COMMUNITY): Payer: BC Managed Care – PPO

## 2023-06-12 ENCOUNTER — Other Ambulatory Visit: Payer: Self-pay

## 2023-06-12 ENCOUNTER — Emergency Department (HOSPITAL_COMMUNITY)
Admission: EM | Admit: 2023-06-12 | Discharge: 2023-06-12 | Disposition: A | Discharge status: Home / Self Care | Payer: BC Managed Care – PPO | Attending: Student | Admitting: Student

## 2023-06-12 DIAGNOSIS — Z79899 Other long term (current) drug therapy: Secondary | ICD-10-CM | POA: Insufficient documentation

## 2023-06-12 DIAGNOSIS — J449 Chronic obstructive pulmonary disease, unspecified: Secondary | ICD-10-CM | POA: Insufficient documentation

## 2023-06-12 DIAGNOSIS — R531 Weakness: Secondary | ICD-10-CM | POA: Insufficient documentation

## 2023-06-12 DIAGNOSIS — I10 Essential (primary) hypertension: Secondary | ICD-10-CM | POA: Insufficient documentation

## 2023-06-12 DIAGNOSIS — E860 Dehydration: Secondary | ICD-10-CM | POA: Diagnosis not present

## 2023-06-12 DIAGNOSIS — R42 Dizziness and giddiness: Secondary | ICD-10-CM | POA: Insufficient documentation

## 2023-06-12 DIAGNOSIS — R55 Syncope and collapse: Secondary | ICD-10-CM | POA: Diagnosis present

## 2023-06-12 LAB — COMPREHENSIVE METABOLIC PANEL
ALT: 44 U/L (ref 0–44)
AST: 135 U/L — ABNORMAL HIGH (ref 15–41)
Albumin: 4.4 g/dL (ref 3.5–5.0)
Alkaline Phosphatase: 86 U/L (ref 38–126)
Anion gap: 17 — ABNORMAL HIGH (ref 5–15)
BUN: 16 mg/dL (ref 6–20)
CO2: 28 mmol/L (ref 22–32)
Calcium: 9.4 mg/dL (ref 8.9–10.3)
Chloride: 86 mmol/L — ABNORMAL LOW (ref 98–111)
Creatinine, Ser: 1.13 mg/dL (ref 0.61–1.24)
GFR, Estimated: >60 mL/min (ref >60)
Glucose, Bld: 104 mg/dL — ABNORMAL HIGH (ref 70–99)
Potassium: 3.5 mmol/L (ref 3.5–5.1)
Sodium: 131 mmol/L — ABNORMAL LOW (ref 135–145)
Total Bilirubin: 2.3 mg/dL — ABNORMAL HIGH (ref 0.3–1.2)
Total Protein: 8.1 g/dL (ref 6.5–8.1)

## 2023-06-12 LAB — URINALYSIS, ROUTINE W REFLEX MICROSCOPIC
Bacteria, UA: NONE SEEN
Bilirubin Urine: NEGATIVE
Glucose, UA: NEGATIVE mg/dL
Ketones, ur: 5 mg/dL — AB
Leukocytes,Ua: NEGATIVE
Nitrite: NEGATIVE
Protein, ur: 30 mg/dL — AB
Specific Gravity, Urine: 1.014 (ref 1.005–1.030)
pH: 5 (ref 5.0–8.0)

## 2023-06-12 LAB — CBG MONITORING, ED: Glucose-Capillary: 96 mg/dL (ref 70–99)

## 2023-06-12 LAB — CBC WITH DIFFERENTIAL/PLATELET
Abs Immature Granulocytes: 0.07 10*3/uL (ref 0.00–0.07)
Basophils Absolute: 0 10*3/uL (ref 0.0–0.1)
Basophils Relative: 0 %
Eosinophils Absolute: 0 10*3/uL (ref 0.0–0.5)
Eosinophils Relative: 0 %
HCT: 33.5 % — ABNORMAL LOW (ref 39.0–52.0)
Hemoglobin: 11.5 g/dL — ABNORMAL LOW (ref 13.0–17.0)
Immature Granulocytes: 1 %
Lymphocytes Relative: 9 %
Lymphs Abs: 1.2 10*3/uL (ref 0.7–4.0)
MCH: 34.8 pg — ABNORMAL HIGH (ref 26.0–34.0)
MCHC: 34.3 g/dL (ref 30.0–36.0)
MCV: 101.5 fL — ABNORMAL HIGH (ref 80.0–100.0)
Monocytes Absolute: 1.1 10*3/uL — ABNORMAL HIGH (ref 0.1–1.0)
Monocytes Relative: 8 %
Neutro Abs: 10.5 10*3/uL — ABNORMAL HIGH (ref 1.7–7.7)
Neutrophils Relative %: 82 %
Platelets: ADEQUATE 10*3/uL (ref 150–400)
RBC: 3.3 MIL/uL — ABNORMAL LOW (ref 4.22–5.81)
RDW: 12 % (ref 11.5–15.5)
Smear Review: ADEQUATE
WBC: 12.9 10*3/uL — ABNORMAL HIGH (ref 4.0–10.5)
nRBC: 0 % (ref 0.0–0.2)

## 2023-06-12 MED ORDER — SODIUM CHLORIDE 0.9 % IV BOLUS
1000.0000 mL | Freq: Once | INTRAVENOUS | Status: AC
  Administered 2023-06-12: 1000 mL via INTRAVENOUS

## 2023-06-12 NOTE — ED Triage Notes (Signed)
Pt was at work last night states he felt like he was going to pass out, pt states he felt like he got to hot and instead of stopping to go get water he kept working. Ex wife states that when his place of work called her they told her he passed out and a coworker caught him.

## 2023-06-12 NOTE — ED Notes (Signed)
Patient Alert and oriented to baseline. Stable and ambulatory to baseline. Patient verbalized understanding of the discharge instructions.  Patient belongings were taken by the patient.   

## 2023-06-12 NOTE — ED Provider Notes (Signed)
EMERGENCY DEPARTMENT AT [facility] Provider Note   CSN: [id number] Arrival date & time: 06/12/23  0932     History  Chief Complaint  Patient presents with   Weakness   Near Syncope    Donald Stewart is a 52 y.o. male with a history including hypertension, hyperlipidemia COPD and history of alcohol dependence presenting for evaluation of generalized weakness and syncopal event which occurred at work last night.  He works in a [employer] and states it was exceptionally hot there last night.  He was hours into his shift when he felt lightheaded, denies full syncope, but wife at bedside endorses coworkers had to catch him to keep him from falling.  He currently just feels tired, denies any complaint of headache, sob, chest pain, palpitations, n/v, abd pain, no dysuria or reduced urination.  He does drink etoh most days, last intake was 3 mornings ago.  He finished his shift at 7 am today, but mostly sat down during the remainder of his shift. His only complaint at this time is for fatigue.   The history is provided by the patient.       Home Medications Prior to Admission medications   Medication Sig Start Date End Date Taking? Authorizing Provider  albuterol (VENTOLIN HFA) 108 (90 Base) MCG/ACT inhaler Inhale 2 puffs into the lungs every 6 (six) hours as needed for wheezing or shortness of breath. 07/10/22   Anabel Halon, MD  amLODipine (NORVASC) 5 MG tablet Take 1 tablet (5 mg total) by mouth daily. 11/14/22   Anabel Halon, MD  cetirizine (ZYRTEC) 10 MG tablet Take 1 tablet (10 mg total) by mouth daily. 07/04/22   Anabel Halon, MD  fluticasone (FLONASE) 50 MCG/ACT nasal spray Place 2 sprays into both nostrils daily. 07/04/22   Anabel Halon, MD  folic acid (FOLVITE) 1 MG tablet Take 1 tablet (1 mg total) by mouth daily. 11/14/22   Anabel Halon, MD  ibuprofen (ADVIL) 800 MG tablet Take 800 mg by mouth 3 (three) times daily. 05/14/23    [provider]  potassium chloride SA (KLOR-CON M) 20 MEQ tablet Take 1 tablet (20 mEq total) by mouth daily. 11/14/22   Anabel Halon, MD  pravastatin (PRAVACHOL) 40 MG tablet TAKE 1 TABLET BY MOUTH DAILY 11/14/22   Anabel Halon, MD  promethazine-dextromethorphan (PROMETHAZINE-DM) 6.25-15 MG/5ML syrup Take 5 mLs by mouth 4 (four) times daily as needed for cough. 07/10/22   Anabel Halon, MD  thiamine (VITAMIN B-1) 50 MG tablet Take 1 tablet (50 mg total) by mouth daily. 11/14/22   Anabel Halon, MD  Vitamin D, Ergocalciferol, (DRISDOL) 1.25 MG (50000 UNIT) CAPS capsule Take 1 capsule (50,000 Units total) by mouth every 7 (seven) days. 11/15/22   Anabel Halon, MD      Allergies    Patient has no known allergies.    Review of Systems   Review of Systems  HENT:  Negative for congestion.   Eyes: Negative.   Respiratory:  Negative for chest tightness and shortness of breath.   Skin: Negative.  Negative for rash and wound.  Psychiatric/Behavioral: Negative.    All other systems reviewed and are negative.   Physical Exam Updated Vital Signs BP 116/89   Pulse (!) 103   Temp 98.9 F (37.2 C) (Oral)   Resp 10   Ht 5\' 7"  (1.702 m)   Wt 49.4 kg   SpO2 97%  BMI 17.06 kg/m  Physical Exam Vitals and nursing note reviewed.  Constitutional:      Appearance: He is well-developed.  HENT:     Head: Normocephalic and atraumatic.     Mouth/Throat:     Mouth: Mucous membranes are dry.  Eyes:     General: No scleral icterus.    Conjunctiva/sclera: Conjunctivae normal.  Cardiovascular:     Rate and Rhythm: Normal rate and regular rhythm.     Pulses: Normal pulses.     Heart sounds: Normal heart sounds.  Pulmonary:     Effort: Pulmonary effort is normal.     Breath sounds: Normal breath sounds. No wheezing.  Abdominal:     General: Bowel sounds are normal. There is no distension.     Palpations: Abdomen is soft.     Tenderness: There is no abdominal tenderness. There is no  guarding.  Musculoskeletal:        General: Normal range of motion.     Cervical back: Normal range of motion.  Skin:    General: Skin is warm and dry.  Neurological:     General: No focal deficit present.     Mental Status: He is alert.     ED Results / Procedures / Treatments   Labs (all labs ordered are listed, but only abnormal results are displayed) Labs Reviewed  CBC WITH DIFFERENTIAL/PLATELET - Abnormal; Notable for the following components:      Result Value   WBC 12.9 (*)    RBC 3.30 (*)    Hemoglobin 11.5 (*)    HCT 33.5 (*)    MCV 101.5 (*)    MCH 34.8 (*)    Neutro Abs 10.5 (*)    Monocytes Absolute 1.1 (*)    All other components within normal limits  COMPREHENSIVE METABOLIC PANEL - Abnormal; Notable for the following components:   Sodium 131 (*)    Chloride 86 (*)    Glucose, Bld 104 (*)    AST 135 (*)    Total Bilirubin 2.3 (*)    Anion gap 17 (*)    All other components within normal limits  URINALYSIS, ROUTINE W REFLEX MICROSCOPIC - Abnormal; Notable for the following components:   APPearance HAZY (*)    Hgb urine dipstick SMALL (*)    Ketones, ur 5 (*)    Protein, ur 30 (*)    All other components within normal limits  CBG MONITORING, ED    EKG EKG Interpretation Date/Time:  Wednesday June 12 2023 11:02:07 EDT Ventricular Rate:  102 PR Interval:  133 QRS Duration:  84 QT Interval:  357 QTC Calculation: 465 R Axis:   23  Text Interpretation: Sinus tachycardia Confirmed by Kommor, Madison ([id number]) on 06/12/2023 11:54:44 AM  Radiology DG Chest Portable 1 View  Result Date: 06/12/2023 CLINICAL DATA:  Syncope.  Tachycardia. EXAM: PORTABLE CHEST 1 VIEW COMPARISON:  Chest radiographs 09/07/2020, 08/29/2020, 08/16/2020, 08/08/2019 FINDINGS: Cardiac silhouette and mediastinal contours are within limits. The lungs are clear. No pleural effusion or pneumothorax. There is irregularity and up to 2 mm superior cortical step-off at the right clavicular head  suspicious for an age indeterminate fracture, new from most recent 09/07/2020 radiograph. IMPRESSION: 1. No acute cardiopulmonary process. 2. Findings suspicious for an age indeterminate right clavicular head fracture, new from most recent comparison radiograph 09/07/2020. Recommend clinical correlation. Electronically Signed   By: Neita Garnet M.D.   On: 06/12/2023 13:03    Procedures Procedures    Medications Ordered  in ED Medications  sodium chloride 0.9 % bolus 1,000 mL (0 mLs Intravenous Stopped 06/12/23 1442)  sodium chloride 0.9 % bolus 1,000 mL (1,000 mLs Intravenous New Bag/Given 06/12/23 1453)    ED Course/ Medical Decision Making/ A&P                                 Medical Decision Making Patient presenting with a near full syncopal event last night at work, describes working in a hot environment and he was not hydrating during his shift.  Denies chest pain, palpitations, doubt cardiac source of this episode.  Think this is more likely to be heat related and/or dehydration.  He also does have a history of significant alcohol intake, last intake was 3 days ago however.  He initially presents tachycardic  but not hypertensive no other physical findings to suggest symptoms are secondary to withdrawal.  His orthostatic vital signs suggesting he is dehydrated.  He was given 1 L of IV fluids and stating he felt improved but his pulse is still borderline elevated at 100-1 03.  He is amenable to an additional fluid bolus after which if he still continues to feel well he will be able to be discharged home.  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  Amount and/or Complexity of Data Reviewed Labs: ordered.    Details: C-Met significant for an AST of 135, he also has a bilirubin of 2.3, of note he has no  complaints of abdominal pain and his abdominal exam is reassuring.  He has a mild leukocytosis with a WBC count of 12.9, macrocytic anemia hemoglobin 11.5 which is relatively stable. Radiology: ordered.    Details: Chest x-ray reviewed, no complaint of pain along his clavicle. ECG/medicine tests: ordered and independent interpretation performed.    Details: Sinus tachycardia,           Final Clinical Impression(s) / ED Diagnoses Final diagnoses:  Near syncope  Dehydration    Rx / DC Orders ED Discharge Orders     None         Victoriano Lain 06/12/23 1531    Kommor, Wyn Forster, MD 06/12/23 [phone]

## 2023-06-12 NOTE — Discharge Instructions (Signed)
Will be very important for you to rest for the next 24 hours, continue to hydrate at home and will be important for you to make sure you are drinking plenty of fluids while at work so that you do not get dehydrated working in this hot environment.

## 2023-06-19 ENCOUNTER — Ambulatory Visit (INDEPENDENT_AMBULATORY_CARE_PROVIDER_SITE_OTHER): Payer: BC Managed Care – PPO | Admitting: Orthopaedic Surgery

## 2023-06-19 ENCOUNTER — Other Ambulatory Visit (INDEPENDENT_AMBULATORY_CARE_PROVIDER_SITE_OTHER): Payer: BC Managed Care – PPO

## 2023-06-19 ENCOUNTER — Encounter: Payer: Self-pay | Admitting: Orthopaedic Surgery

## 2023-06-19 DIAGNOSIS — S92151D Displaced avulsion fracture (chip fracture) of right talus, subsequent encounter for fracture with routine healing: Secondary | ICD-10-CM

## 2023-06-19 NOTE — Progress Notes (Signed)
ankl

## 2023-06-19 NOTE — Progress Notes (Signed)
I am OK.  He has no problem with the right ankle.  He is walking well.  NV intact.  No limp.  ROM full.  X-rays were done of the right ankle, reported separately.  Encounter Diagnosis  Name Primary?   Closed displaced avulsion fracture of right talus with routine healing, subsequent encounter Yes   Discharge.  Call if any problem.  Precautions discussed.  Electronically Signed Darreld Mclean, MD 8/7/20248:18 AM

## 2023-07-29 ENCOUNTER — Encounter: Payer: Self-pay | Admitting: Internal Medicine

## 2023-07-29 ENCOUNTER — Ambulatory Visit: Payer: BC Managed Care – PPO | Admitting: Internal Medicine

## 2023-07-29 VITALS — BP 129/83 | HR 78 | Ht 67.0 in | Wt 114.2 lb

## 2023-07-29 DIAGNOSIS — W19XXXA Unspecified fall, initial encounter: Secondary | ICD-10-CM

## 2023-07-29 DIAGNOSIS — E782 Mixed hyperlipidemia: Secondary | ICD-10-CM

## 2023-07-29 DIAGNOSIS — E441 Mild protein-calorie malnutrition: Secondary | ICD-10-CM

## 2023-07-29 DIAGNOSIS — F1029 Alcohol dependence with unspecified alcohol-induced disorder: Secondary | ICD-10-CM | POA: Diagnosis not present

## 2023-07-29 DIAGNOSIS — J209 Acute bronchitis, unspecified: Secondary | ICD-10-CM

## 2023-07-29 DIAGNOSIS — Z125 Encounter for screening for malignant neoplasm of prostate: Secondary | ICD-10-CM

## 2023-07-29 DIAGNOSIS — I10 Essential (primary) hypertension: Secondary | ICD-10-CM

## 2023-07-29 DIAGNOSIS — E559 Vitamin D deficiency, unspecified: Secondary | ICD-10-CM

## 2023-07-29 DIAGNOSIS — R739 Hyperglycemia, unspecified: Secondary | ICD-10-CM

## 2023-07-29 DIAGNOSIS — J440 Chronic obstructive pulmonary disease with acute lower respiratory infection: Secondary | ICD-10-CM

## 2023-07-29 MED ORDER — FOLIC ACID 1 MG PO TABS
1.0000 mg | ORAL_TABLET | Freq: Every day | ORAL | 3 refills | Status: DC
Start: 2023-07-29 — End: 2023-11-28

## 2023-07-29 MED ORDER — AZITHROMYCIN 250 MG PO TABS: ORAL_TABLET | ORAL | 0 refills | Status: AC

## 2023-07-29 MED ORDER — PROMETHAZINE-DM 6.25-15 MG/5ML PO SYRP
5.0000 mL | ORAL_SOLUTION | Freq: Four times a day (QID) | ORAL | 0 refills | Status: DC | PRN
Start: 2023-07-29 — End: 2023-09-11

## 2023-07-29 MED ORDER — VITAMIN B-1 50 MG PO TABS
50.0000 mg | ORAL_TABLET | Freq: Every day | ORAL | 2 refills | Status: DC
Start: 2023-07-29 — End: 2023-11-05

## 2023-07-29 MED ORDER — AMLODIPINE BESYLATE 5 MG PO TABS
5.0000 mg | ORAL_TABLET | Freq: Every day | ORAL | 1 refills | Status: DC
Start: 2023-07-29 — End: 2023-11-28

## 2023-07-29 MED ORDER — ALBUTEROL SULFATE HFA 108 (90 BASE) MCG/ACT IN AERS: 2.0000 | INHALATION_SPRAY | Freq: Four times a day (QID) | RESPIRATORY_TRACT | 0 refills | Status: DC | PRN

## 2023-07-29 NOTE — Progress Notes (Signed)
Established Patient Office Visit  Subjective:  Patient ID: Donald Stewart, male    DOB: 03-04-1971  Age: 52 y.o. MRN: [id number]  CC:  Chief Complaint  Patient presents with   Hypertension    Four month follow up    URI    Cough, congestion, runny nose for a week    Hip Injury    Patient had previous hip injury on the right side, has recently fell , mother is concerned     HPI Donald Stewart is a 52 y.o. male with past medical history of HTN, HLD, alcohol and tobacco abuse who presents for f/u of his chronic medical conditions. Her mother is present during the visit.  HTN: BP is well-controlled. Takes medications regularly. Patient denies headache, dizziness, chest pain, dyspnea or palpitations.  He has been smoking about 0.5 pack/day now. He takes about 3-4 alcoholic drinks (vodka) and a bear in a day, which is better than 6-8 drinks in a day in the past. He is not willing to decrease alcohol and smoking despite counseling. He denies to join any support group. He has lost about 3 lbs since the last visit.  He denies any fever, chills, decreased appetite, nausea, vomiting, hemoptysis or chronic cough, night sweats, LAD, diarrhea.  He has started taking thiamine, folic acid and potassium supplement now.  He reports nasal congestion, postnasal drip and dry cough for the last 1 week.  He denies any fever or chills.  Denies any dyspnea or wheezing.  He has tried OTC antihistaminic without much relief.  He had a fall while walking on his porch on 07/05/23.  He reports feeling drowsy before the fall.  He has a history of alcohol abuse.  He fell on his right side and had pain of right hip initially, but states that it has improved now.  His mother was concerned about it, but patient denies any further workup.   Past Medical History:  Diagnosis Date   Asthma    Avascular necrosis of hip, right (HCC) 06/08/2021   Hypercholesterolemia    Hypertension     Past Surgical History:   Procedure Laterality Date   TOTAL HIP ARTHROPLASTY Right     Family History  Problem Relation Age of Onset   Hypertension Mother     Social History   Socioeconomic History   Marital status: Divorced    Spouse name: Not on file   Number of children: Not on file   Years of education: Not on file   Highest education level: Not on file  Occupational History   Not on file  Tobacco Use   Smoking status: Every Day    Current packs/day: 0.50    Average packs/day: 0.5 packs/day for 20.0 years (10.0 ttl pk-yrs)    Types: Cigarettes   Smokeless tobacco: Never  Vaping Use   Vaping status: Never Used  Substance and Sexual Activity   Alcohol use: Yes    Comment: drinks until he passes out daily. has no desire to stop   Drug use: No   Sexual activity: Not on file  Other Topics Concern   Not on file  Social History Narrative   Not on file   Social Determinants of Health   Financial Resource Strain: Not on file  Food Insecurity: Not on file  Transportation Needs: Not on file  Physical Activity: Not on file  Stress: Not on file  Social Connections: Not on file  Intimate Partner Violence: Not on file  Outpatient Medications Prior to Visit  Medication Sig Dispense Refill   cetirizine (ZYRTEC) 10 MG tablet Take 1 tablet (10 mg total) by mouth daily. 30 tablet 11   fluticasone (FLONASE) 50 MCG/ACT nasal spray Place 2 sprays into both nostrils daily. 16 g 6   ibuprofen (ADVIL) 800 MG tablet Take 800 mg by mouth 3 (three) times daily.     potassium chloride SA (KLOR-CON M) 20 MEQ tablet Take 1 tablet (20 mEq total) by mouth daily. 90 tablet 1   pravastatin (PRAVACHOL) 40 MG tablet TAKE 1 TABLET BY MOUTH DAILY 90 tablet 1   Vitamin D, Ergocalciferol, (DRISDOL) 1.25 MG (50000 UNIT) CAPS capsule Take 1 capsule (50,000 Units total) by mouth every 7 (seven) days. 12 capsule 1   albuterol (VENTOLIN HFA) 108 (90 Base) MCG/ACT inhaler Inhale 2 puffs into the lungs every 6 (six) hours as  needed for wheezing or shortness of breath. 8 g 0   amLODipine (NORVASC) 5 MG tablet Take 1 tablet (5 mg total) by mouth daily. 90 tablet 1   folic acid (FOLVITE) 1 MG tablet Take 1 tablet (1 mg total) by mouth daily. 90 tablet 3   promethazine-dextromethorphan (PROMETHAZINE-DM) 6.25-15 MG/5ML syrup Take 5 mLs by mouth 4 (four) times daily as needed for cough. 118 mL 0   thiamine (VITAMIN B-1) 50 MG tablet Take 1 tablet (50 mg total) by mouth daily. 100 tablet 2   No facility-administered medications prior to visit.    No Known Allergies  ROS Review of Systems  Constitutional:  Positive for unexpected weight change. Negative for chills and fever.  HENT:  Positive for congestion, postnasal drip and sore throat.   Eyes:  Negative for pain and discharge.  Respiratory:  Positive for cough. Negative for shortness of breath.   Cardiovascular:  Negative for chest pain and palpitations.  Gastrointestinal:  Negative for constipation, diarrhea, nausea and vomiting.  Endocrine: Negative for polydipsia and polyuria.  Genitourinary:  Negative for dysuria and hematuria.  Musculoskeletal:  Negative for neck pain and neck stiffness.  Skin:  Negative for rash.  Neurological:  Negative for dizziness, weakness, numbness and headaches.  Psychiatric/Behavioral:  Negative for agitation and behavioral problems.       Objective:    Physical Exam Vitals reviewed.  Constitutional:      General: He is not in acute distress.    Appearance: He is cachectic. He is not diaphoretic.  HENT:     Head: Normocephalic and atraumatic.     Nose: Congestion present.     Mouth/Throat:     Mouth: Mucous membranes are moist.     Pharynx: Posterior oropharyngeal erythema present.  Eyes:     General: No scleral icterus.    Extraocular Movements: Extraocular movements intact.  Cardiovascular:     Rate and Rhythm: Normal rate and regular rhythm.     Pulses: Normal pulses.     Heart sounds: Normal heart sounds. No  murmur heard. Pulmonary:     Breath sounds: Normal breath sounds. No wheezing or rales.  Musculoskeletal:     Cervical back: Neck supple. No tenderness.     Right lower leg: No edema.     Left lower leg: No edema.  Skin:    General: Skin is warm.     Findings: No rash.  Neurological:     General: No focal deficit present.     Mental Status: He is alert and oriented to person, place, and time.     Sensory:  No sensory deficit.     Motor: No weakness.  Psychiatric:        Mood and Affect: Mood normal.        Behavior: Behavior normal.     BP 129/83 (BP Location: Left Arm, Patient Position: Sitting, Cuff Size: Normal)   Pulse 78   Ht 5\' 7"  (1.702 m)   Wt 114 lb 3.2 oz (51.8 kg)   SpO2 90%   BMI 17.89 kg/m  Wt Readings from Last 3 Encounters:  07/29/23 114 lb 3.2 oz (51.8 kg)  06/12/23 108 lb 14.5 oz (49.4 kg)  05/29/23 109 lb (49.4 kg)    Lab Results  Component Value Date   TSH 1.260 11/14/2022   Lab Results  Component Value Date   WBC 12.9 (H) 06/12/2023   HGB 11.5 (L) 06/12/2023   HCT 33.5 (L) 06/12/2023   MCV 101.5 (H) 06/12/2023   PLT  06/12/2023    PLATELET CLUMPS NOTED ON SMEAR, COUNT APPEARS ADEQUATE   Lab Results  Component Value Date   NA 131 (L) 06/12/2023   K 3.5 06/12/2023   CO2 28 06/12/2023   GLUCOSE 104 (H) 06/12/2023   BUN 16 06/12/2023   CREATININE 1.13 06/12/2023   BILITOT 2.3 (H) 06/12/2023   ALKPHOS 86 06/12/2023   AST 135 (H) 06/12/2023   ALT 44 06/12/2023   PROT 8.1 06/12/2023   ALBUMIN 4.4 06/12/2023   CALCIUM 9.4 06/12/2023   ANIONGAP 17 (H) 06/12/2023   EGFR 115 03/27/2023   Lab Results  Component Value Date   CHOL 174 11/14/2022   Lab Results  Component Value Date   HDL 85 11/14/2022   Lab Results  Component Value Date   LDLCALC 61 11/14/2022   Lab Results  Component Value Date   TRIG 171 (H) 11/14/2022   Lab Results  Component Value Date   CHOLHDL 2.0 11/14/2022   Lab Results  Component Value Date   HGBA1C  5.2 06/08/2021      Assessment & Plan:   Problem List Items Addressed This Visit       Cardiovascular and Mediastinum   Primary hypertension    BP Readings from Last 1 Encounters:  07/29/23 129/83   Well-controlled with amlodipine Counseled for compliance with the medications Advised DASH diet and moderate exercise/walking, at least 150 mins/week      Relevant Medications   amLODipine (NORVASC) 5 MG tablet   Other Relevant Orders   TSH   CMP14+EGFR   CBC with Differential/Platelet     Respiratory   Acute bronchitis with COPD (HCC) - Primary    Persistent symptoms for at least 10 days Started empiric azithromycin Promethazine DM syrup PRN for cough Albuterol PRN for dyspnea or wheezing Needs to cut down alcohol use and quit smoking      Relevant Medications   azithromycin (ZITHROMAX) 250 MG tablet   albuterol (VENTOLIN HFA) 108 (90 Base) MCG/ACT inhaler   promethazine-dextromethorphan (PROMETHAZINE-DM) 6.25-15 MG/5ML syrup     Other   Mixed hyperlipidemia    On Pravastatin Checked lipid profile      Relevant Medications   amLODipine (NORVASC) 5 MG tablet   Other Relevant Orders   Lipid panel   Prostate cancer screening    Ordered PSA after discussing its limitations for prostate cancer screening, including false positive results leading to additional investigations.      Relevant Orders   PSA   Protein-calorie malnutrition (HCC)    BMI Readings from Last 3 Encounters:  07/29/23 17.89 kg/m  06/12/23 17.06 kg/m  05/29/23 17.07 kg/m   Likely due to alcohol abuse, needs to cut down Advised to eat at regular intervals Advised to take protein supplements      Relevant Medications   thiamine (VITAMIN B-1) 50 MG tablet   folic acid (FOLVITE) 1 MG tablet   Other Relevant Orders   TSH   CMP14+EGFR   CBC with Differential/Platelet   Alcohol dependence with unspecified alcohol-induced disorder (HCC)    Takes about 3-4 alcoholic drinks in a day Needs  to cut down, but he is not willing despite counseling AA support groups information provided      Relevant Medications   thiamine (VITAMIN B-1) 50 MG tablet   folic acid (FOLVITE) 1 MG tablet   Other Relevant Orders   CMP14+EGFR   CBC with Differential/Platelet   Fall    Had fall likely due to feeling drowsy, could be from dehydration/hyponatremia from alcohol abuse Had hip pain, now resolved -denies any further workup      Other Visit Diagnoses     Vitamin D deficiency       Relevant Orders   VITAMIN D 25 Hydroxy (Vit-D Deficiency, Fractures)   Hyperglycemia       Relevant Orders   Hemoglobin A1c       Meds ordered this encounter  Medications   azithromycin (ZITHROMAX) 250 MG tablet    Sig: Take 2 tablets on day 1, then 1 tablet daily on days 2 through 5    Dispense:  6 tablet    Refill:  0   albuterol (VENTOLIN HFA) 108 (90 Base) MCG/ACT inhaler    Sig: Inhale 2 puffs into the lungs every 6 (six) hours as needed for wheezing or shortness of breath.    Dispense:  8 g    Refill:  0    Okay to substitute to generic/formulary Albuterol.   thiamine (VITAMIN B-1) 50 MG tablet    Sig: Take 1 tablet (50 mg total) by mouth daily.    Dispense:  100 tablet    Refill:  2   folic acid (FOLVITE) 1 MG tablet    Sig: Take 1 tablet (1 mg total) by mouth daily.    Dispense:  90 tablet    Refill:  3   promethazine-dextromethorphan (PROMETHAZINE-DM) 6.25-15 MG/5ML syrup    Sig: Take 5 mLs by mouth 4 (four) times daily as needed for cough.    Dispense:  118 mL    Refill:  0   amLODipine (NORVASC) 5 MG tablet    Sig: Take 1 tablet (5 mg total) by mouth daily.    Dispense:  90 tablet    Refill:  1    Follow-up: Return in about 4 months (around 11/28/2023).    Anabel Halon, MD

## 2023-07-29 NOTE — Patient Instructions (Signed)
Please start taking Azithromycin as prescribed. Please use Albuterol as needed for shortness of breath or wheezing.  Please continue to take medications as prescribed.  Please continue to follow low salt diet and perform moderate exercise/walking at least 150 mins/week.

## 2023-08-02 ENCOUNTER — Encounter: Payer: Self-pay | Admitting: Internal Medicine

## 2023-08-02 DIAGNOSIS — W19XXXA Unspecified fall, initial encounter: Secondary | ICD-10-CM | POA: Insufficient documentation

## 2023-08-02 NOTE — Assessment & Plan Note (Signed)
On Pravastatin Checked lipid profile

## 2023-08-02 NOTE — Assessment & Plan Note (Signed)
Persistent symptoms for at least 10 days Started empiric azithromycin Promethazine DM syrup PRN for cough Albuterol PRN for dyspnea or wheezing Needs to cut down alcohol use and quit smoking

## 2023-08-02 NOTE — Assessment & Plan Note (Signed)
Ordered PSA after discussing its limitations for prostate cancer screening, including false positive results leading to additional investigations.

## 2023-08-02 NOTE — Assessment & Plan Note (Signed)
Had fall likely due to feeling drowsy, could be from dehydration/hyponatremia from alcohol abuse Had hip pain, now resolved -denies any further workup

## 2023-08-02 NOTE — Assessment & Plan Note (Signed)
BMI Readings from Last 3 Encounters:  07/29/23 17.89 kg/m  06/12/23 17.06 kg/m  05/29/23 17.07 kg/m   Likely due to alcohol abuse, needs to cut down Advised to eat at regular intervals Advised to take protein supplements

## 2023-08-02 NOTE — Assessment & Plan Note (Signed)
BP Readings from Last 1 Encounters:  07/29/23 129/83   Well-controlled with amlodipine Counseled for compliance with the medications Advised DASH diet and moderate exercise/walking, at least 150 mins/week

## 2023-08-02 NOTE — Assessment & Plan Note (Signed)
Takes about 3-4 alcoholic drinks in a day Needs to cut down, but he is not willing despite counseling AA support groups information provided

## 2023-08-07 ENCOUNTER — Other Ambulatory Visit: Payer: Self-pay | Admitting: Internal Medicine

## 2023-08-07 DIAGNOSIS — E876 Hypokalemia: Secondary | ICD-10-CM

## 2023-09-10 ENCOUNTER — Emergency Department (HOSPITAL_COMMUNITY)
Admission: EM | Admit: 2023-09-10 | Discharge: 2023-09-10 | Disposition: A | Discharge status: Home / Self Care | Payer: BC Managed Care – PPO | Attending: Emergency Medicine | Admitting: Emergency Medicine

## 2023-09-10 ENCOUNTER — Telehealth: Payer: Self-pay | Admitting: Internal Medicine

## 2023-09-10 ENCOUNTER — Other Ambulatory Visit: Payer: Self-pay

## 2023-09-10 DIAGNOSIS — I10 Essential (primary) hypertension: Secondary | ICD-10-CM | POA: Insufficient documentation

## 2023-09-10 DIAGNOSIS — M792 Neuralgia and neuritis, unspecified: Secondary | ICD-10-CM | POA: Diagnosis not present

## 2023-09-10 DIAGNOSIS — Z79899 Other long term (current) drug therapy: Secondary | ICD-10-CM | POA: Insufficient documentation

## 2023-09-10 DIAGNOSIS — F1099 Alcohol use, unspecified with unspecified alcohol-induced disorder: Secondary | ICD-10-CM | POA: Diagnosis not present

## 2023-09-10 DIAGNOSIS — R202 Paresthesia of skin: Secondary | ICD-10-CM | POA: Diagnosis present

## 2023-09-10 LAB — CBC WITH DIFFERENTIAL/PLATELET
Abs Immature Granulocytes: 0.05 10*3/uL (ref 0.00–0.07)
Basophils Absolute: 0 10*3/uL (ref 0.0–0.1)
Basophils Relative: 0 %
Eosinophils Absolute: 0 10*3/uL (ref 0.0–0.5)
Eosinophils Relative: 0 %
HCT: 37.3 % — ABNORMAL LOW (ref 39.0–52.0)
Hemoglobin: 12.1 g/dL — ABNORMAL LOW (ref 13.0–17.0)
Immature Granulocytes: 1 %
Lymphocytes Relative: 10 %
Lymphs Abs: 1 10*3/uL (ref 0.7–4.0)
MCH: 35.1 pg — ABNORMAL HIGH (ref 26.0–34.0)
MCHC: 32.4 g/dL (ref 30.0–36.0)
MCV: 108.1 fL — ABNORMAL HIGH (ref 80.0–100.0)
Monocytes Absolute: 0.9 10*3/uL (ref 0.1–1.0)
Monocytes Relative: 9 %
Neutro Abs: 8.3 10*3/uL — ABNORMAL HIGH (ref 1.7–7.7)
Neutrophils Relative %: 80 %
Platelets: 143 10*3/uL — ABNORMAL LOW (ref 150–400)
RBC: 3.45 MIL/uL — ABNORMAL LOW (ref 4.22–5.81)
RDW: 13.1 % (ref 11.5–15.5)
WBC: 10.3 10*3/uL (ref 4.0–10.5)
nRBC: 0 % (ref 0.0–0.2)

## 2023-09-10 LAB — FOLATE: Folate: 11.9 ng/mL (ref >5.9)

## 2023-09-10 LAB — COMPREHENSIVE METABOLIC PANEL
ALT: 21 U/L (ref 0–44)
AST: 64 U/L — ABNORMAL HIGH (ref 15–41)
Albumin: 4.4 g/dL (ref 3.5–5.0)
Alkaline Phosphatase: 90 U/L (ref 38–126)
Anion gap: 28 — ABNORMAL HIGH (ref 5–15)
BUN: 20 mg/dL (ref 6–20)
CO2: 13 mmol/L — ABNORMAL LOW (ref 22–32)
Calcium: 8.2 mg/dL — ABNORMAL LOW (ref 8.9–10.3)
Chloride: 101 mmol/L (ref 98–111)
Creatinine, Ser: 1.6 mg/dL — ABNORMAL HIGH (ref 0.61–1.24)
GFR, Estimated: 52 mL/min — ABNORMAL LOW (ref >60)
Glucose, Bld: 158 mg/dL — ABNORMAL HIGH (ref 70–99)
Potassium: 4.5 mmol/L (ref 3.5–5.1)
Sodium: 142 mmol/L (ref 135–145)
Total Bilirubin: 2.5 mg/dL — ABNORMAL HIGH (ref 0.3–1.2)
Total Protein: 8.4 g/dL — ABNORMAL HIGH (ref 6.5–8.1)

## 2023-09-10 LAB — RETICULOCYTES
Immature Retic Fract: 16.9 % — ABNORMAL HIGH (ref 2.3–15.9)
RBC.: 3.45 MIL/uL — ABNORMAL LOW (ref 4.22–5.81)
Retic Count, Absolute: 59.7 10*3/uL (ref 19.0–186.0)
Retic Ct Pct: 1.7 % (ref 0.4–3.1)

## 2023-09-10 LAB — VITAMIN B12: Vitamin B-12: 745 pg/mL (ref 180–914)

## 2023-09-10 LAB — IRON AND TIBC
Iron: 232 ug/dL — ABNORMAL HIGH (ref 45–182)
Saturation Ratios: 88 % — ABNORMAL HIGH (ref 17.9–39.5)
TIBC: 264 ug/dL (ref 250–450)
UIBC: 32 ug/dL

## 2023-09-10 LAB — FERRITIN: Ferritin: 908 ng/mL — ABNORMAL HIGH (ref 24–336)

## 2023-09-10 MED ORDER — GABAPENTIN 100 MG PO CAPS: 100.0000 mg | ORAL_CAPSULE | Freq: Three times a day (TID) | ORAL | 0 refills | Status: DC

## 2023-09-10 MED ORDER — SODIUM CHLORIDE 0.9 % IV BOLUS
1000.0000 mL | Freq: Once | INTRAVENOUS | Status: AC
  Administered 2023-09-10: 1000 mL via INTRAVENOUS

## 2023-09-10 NOTE — ED Notes (Signed)
Patients mother is at bedside, She states she has noticed tremor to patients hands and patient also smokes and drinks.

## 2023-09-10 NOTE — ED Triage Notes (Signed)
Patient complaint of can't walk. Stated it started yesterday, has numbness localized in both feet.

## 2023-09-10 NOTE — Telephone Encounter (Signed)
FYI, Patient mother came by he was seen ER would like to review notes before tomorrow morning appointment.

## 2023-09-10 NOTE — ED Notes (Signed)
Water given to patient 

## 2023-09-10 NOTE — ED Provider Notes (Signed)
[facility] AT [facility] Provider Note   CSN: [id number] Arrival date & time: 09/10/23  7846     History {Add pertinent medical, surgical, social history, OB history to HPI:1} Chief Complaint  Patient presents with   Foot Pain    Donald Stewart is a 52 y.o. male.  Patient with a history of hypertension and alcohol use.  Patient complains of numbness to his feet   Foot Pain       Home Medications Prior to Admission medications   Medication Sig Start Date End Date Taking? Authorizing Provider  gabapentin (NEURONTIN) 100 MG capsule Take 1 capsule (100 mg total) by mouth 3 (three) times daily. 09/10/23  Yes Bethann Berkshire, MD  albuterol (VENTOLIN HFA) 108 (90 Base) MCG/ACT inhaler Inhale 2 puffs into the lungs every 6 (six) hours as needed for wheezing or shortness of breath. 07/29/23   Anabel Halon, MD  amLODipine (NORVASC) 5 MG tablet Take 1 tablet (5 mg total) by mouth daily. 07/29/23   Anabel Halon, MD  cetirizine (ZYRTEC) 10 MG tablet Take 1 tablet (10 mg total) by mouth daily. 07/04/22   Anabel Halon, MD  fluticasone (FLONASE) 50 MCG/ACT nasal spray Place 2 sprays into both nostrils daily. 07/04/22   Anabel Halon, MD  folic acid (FOLVITE) 1 MG tablet Take 1 tablet (1 mg total) by mouth daily. 07/29/23   Anabel Halon, MD  ibuprofen (ADVIL) 800 MG tablet Take 800 mg by mouth 3 (three) times daily. 05/14/23   [provider]  potassium chloride SA (KLOR-CON M) 20 MEQ tablet TAKE 1 TABLET(20 MEQ) BY MOUTH DAILY 08/07/23   Anabel Halon, MD  pravastatin (PRAVACHOL) 40 MG tablet TAKE 1 TABLET BY MOUTH DAILY 11/14/22   Anabel Halon, MD  promethazine-dextromethorphan (PROMETHAZINE-DM) 6.25-15 MG/5ML syrup Take 5 mLs by mouth 4 (four) times daily as needed for cough. 07/29/23   Anabel Halon, MD  thiamine (VITAMIN B-1) 50 MG tablet Take 1 tablet (50 mg total) by mouth daily. 07/29/23   Anabel Halon, MD  Vitamin D,  Ergocalciferol, (DRISDOL) 1.25 MG (50000 UNIT) CAPS capsule Take 1 capsule (50,000 Units total) by mouth every 7 (seven) days. 11/15/22   Anabel Halon, MD      Allergies    Patient has no known allergies.    Review of Systems   Review of Systems  Physical Exam Updated Vital Signs BP 115/77   Pulse (!) 106   Temp 98.7 F (37.1 C) (Oral)   Resp (!) 30   Ht 5\' 11"  (1.803 m)   Wt 54.4 kg   SpO2 100%   BMI 16.74 kg/m  Physical Exam  ED Results / Procedures / Treatments   Labs (all labs ordered are listed, but only abnormal results are displayed) Labs Reviewed  CBC WITH DIFFERENTIAL/PLATELET - Abnormal; Notable for the following components:      Result Value   RBC 3.45 (*)    Hemoglobin 12.1 (*)    HCT 37.3 (*)    MCV 108.1 (*)    MCH 35.1 (*)    Platelets 143 (*)    Neutro Abs 8.3 (*)    All other components within normal limits  COMPREHENSIVE METABOLIC PANEL - Abnormal; Notable for the following components:   CO2 13 (*)    Glucose, Bld 158 (*)    Creatinine, Ser 1.60 (*)    Calcium 8.2 (*)    Total Protein 8.4 (*)  AST 64 (*)    Total Bilirubin 2.5 (*)    GFR, Estimated 52 (*)    Anion gap 28 (*)    All other components within normal limits  IRON AND TIBC - Abnormal; Notable for the following components:   Iron 232 (*)    Saturation Ratios 88 (*)    All other components within normal limits  FERRITIN - Abnormal; Notable for the following components:   Ferritin 908 (*)    All other components within normal limits  RETICULOCYTES - Abnormal; Notable for the following components:   RBC. 3.45 (*)    Immature Retic Fract 16.9 (*)    All other components within normal limits  VITAMIN B12  FOLATE    EKG None  Radiology No results found.  Procedures Procedures  {Document cardiac monitor, telemetry assessment procedure when appropriate:1}  Medications Ordered in ED Medications  sodium chloride 0.9 % bolus 1,000 mL (0 mLs Intravenous Stopped 09/10/23  1221)    ED Course/ Medical Decision Making/ A&P   {   Click here for ABCD2, HEART and other calculatorsREFRESH Note before signing :1}                              Medical Decision Making Amount and/or Complexity of Data Reviewed Labs: ordered. ECG/medicine tests: ordered.  Risk Prescription drug management.  Patient with peripheral neuritis.  Possibly related to EtOH use.  He will follow-up with his PCP and started on Neurontin  {Document critical care time when appropriate:1} {Document review of labs and clinical decision tools ie heart score, Chads2Vasc2 etc:1}  {Document your independent review of radiology images, and any outside records:1} {Document your discussion with family members, caretakers, and with consultants:1} {Document social determinants of health affecting pt's care:1} {Document your decision making why or why not admission, treatments were needed:1} Final Clinical Impression(s) / ED Diagnoses Final diagnoses:  Neuritis    Rx / DC Orders ED Discharge Orders          Ordered    gabapentin (NEURONTIN) 100 MG capsule  3 times daily        09/10/23 1226

## 2023-09-10 NOTE — Discharge Instructions (Signed)
Follow-up with your family doctor next week for recheck. 

## 2023-09-11 ENCOUNTER — Encounter: Payer: Self-pay | Admitting: Internal Medicine

## 2023-09-11 ENCOUNTER — Ambulatory Visit: Payer: BC Managed Care – PPO | Admitting: Internal Medicine

## 2023-09-11 VITALS — BP 118/68 | HR 96 | Ht 67.0 in | Wt 112.6 lb

## 2023-09-11 DIAGNOSIS — Z09 Encounter for follow-up examination after completed treatment for conditions other than malignant neoplasm: Secondary | ICD-10-CM | POA: Diagnosis not present

## 2023-09-11 DIAGNOSIS — F1029 Alcohol dependence with unspecified alcohol-induced disorder: Secondary | ICD-10-CM

## 2023-09-11 DIAGNOSIS — N179 Acute kidney failure, unspecified: Secondary | ICD-10-CM | POA: Diagnosis not present

## 2023-09-11 DIAGNOSIS — K709 Alcoholic liver disease, unspecified: Secondary | ICD-10-CM | POA: Diagnosis not present

## 2023-09-11 DIAGNOSIS — Z23 Encounter for immunization: Secondary | ICD-10-CM

## 2023-09-11 DIAGNOSIS — G629 Polyneuropathy, unspecified: Secondary | ICD-10-CM | POA: Insufficient documentation

## 2023-09-11 DIAGNOSIS — D539 Nutritional anemia, unspecified: Secondary | ICD-10-CM | POA: Insufficient documentation

## 2023-09-11 NOTE — Assessment & Plan Note (Signed)
Likely due to alcohol abuse and nutritional deficiencies Continue thiamine, folic acid and B12 supplement Gabapentin was prescribed from the ER, advised to avoid alcohol use while taking gabapentin

## 2023-09-11 NOTE — Assessment & Plan Note (Signed)
Likely due to alcohol abuse Continue folic acid and B12 supplement No signs of active bleeding

## 2023-09-11 NOTE — Assessment & Plan Note (Signed)
Elevated liver enzymes in CMP, likely due to alcoholic liver disease in addition to dehydration Needs to cut down/avoid alcohol use Continue thiamine, folic acid and B12 supplement Maintain adequate hydration Check [place] RUQ

## 2023-09-11 NOTE — Assessment & Plan Note (Signed)
Takes about 3-4 alcoholic drinks in a day Needs to cut down, but he is not willing despite counseling AA support groups information provided

## 2023-09-11 NOTE — Assessment & Plan Note (Signed)
ER chart reviewed, including blood tests Was given IV fluids Gabapentin for neuropathy, advised to avoid alcohol use while taking gabapentin

## 2023-09-11 NOTE — Progress Notes (Signed)
Established Patient Office Visit  Subjective:  Patient ID: Donald Stewart, male    DOB: 11-18-1970  Age: 52 y.o. MRN: [id number]  CC:  Chief Complaint  Patient presents with   Follow-up    ER f/u from 09/10/2023, pt reports feeling well today, was having trouble walking yesterday.     HPI [name] is a 52 y.o. male with past medical history of HTN, HLD, alcohol and tobacco abuse who presents for f/u of his ER visit on 09/10/23. Her mother is present during the visit.   He went to ER if she was not able to walk due to severe weakness in legs.  In ER, he was found to have AKI and elevated liver enzymes.  He was given IV fluid and discharged home.  Later during the day, he felt better and apparently went to work last night as well.  Today, he feels better and denies severe leg weakness.  He denies any focal numbness or tingling.  He was given gabapentin from ER, but has not taken it yet.  He takes about 3-4 alcoholic drinks (vodka) and a bear in a day, which is better than 6-8 drinks in a day in the past. He is not willing to decrease alcohol and smoking despite counseling. He denies to join any support group. He has lost about 2 lbs since the last visit.  He denies any fever, chills, decreased appetite, nausea, vomiting, hemoptysis or chronic cough, night sweats, LAD, diarrhea.  He has started taking thiamine, folic acid and potassium supplement now, but compliance is questionable.  Past Medical History:  Diagnosis Date   Asthma    Avascular necrosis of hip, right (HCC) 06/08/2021   Hypercholesterolemia    Hypertension     Past Surgical History:  Procedure Laterality Date   TOTAL HIP ARTHROPLASTY Right     Family History  Problem Relation Age of Onset   Hypertension Mother     Social History   Socioeconomic History   Marital status: Divorced    Spouse name: Not on file   Number of children: Not on file   Years of education: Not on file   Highest education  level: Not on file  Occupational History   Not on file  Tobacco Use   Smoking status: Every Day    Current packs/day: 0.50    Average packs/day: 0.5 packs/day for 20.0 years (10.0 ttl pk-yrs)    Types: Cigarettes   Smokeless tobacco: Never  Vaping Use   Vaping status: Never Used  Substance and Sexual Activity   Alcohol use: Yes    Comment: drinks until he passes out daily. has no desire to stop   Drug use: No   Sexual activity: Not on file  Other Topics Concern   Not on file  Social History Narrative   Not on file   Social Determinants of Health   Financial Resource Strain: Not on file  Food Insecurity: Not on file  Transportation Needs: Not on file  Physical Activity: Not on file  Stress: Not on file  Social Connections: Not on file  Intimate Partner Violence: Not on file    Outpatient Medications Prior to Visit  Medication Sig Dispense Refill   albuterol (VENTOLIN HFA) 108 (90 Base) MCG/ACT inhaler Inhale 2 puffs into the lungs every 6 (six) hours as needed for wheezing or shortness of breath. 8 g 0   amLODipine (NORVASC) 5 MG tablet Take 1 tablet (5 mg total) by mouth daily.  90 tablet 1   cetirizine (ZYRTEC) 10 MG tablet Take 1 tablet (10 mg total) by mouth daily. 30 tablet 11   fluticasone (FLONASE) 50 MCG/ACT nasal spray Place 2 sprays into both nostrils daily. 16 g 6   folic acid (FOLVITE) 1 MG tablet Take 1 tablet (1 mg total) by mouth daily. 90 tablet 3   gabapentin (NEURONTIN) 100 MG capsule Take 1 capsule (100 mg total) by mouth 3 (three) times daily. 60 capsule 0   ibuprofen (ADVIL) 800 MG tablet Take 800 mg by mouth 3 (three) times daily.     potassium chloride SA (KLOR-CON M) 20 MEQ tablet TAKE 1 TABLET(20 MEQ) BY MOUTH DAILY 90 tablet 1   pravastatin (PRAVACHOL) 40 MG tablet TAKE 1 TABLET BY MOUTH DAILY 90 tablet 1   Vitamin D, Ergocalciferol, (DRISDOL) 1.25 MG (50000 UNIT) CAPS capsule Take 1 capsule (50,000 Units total) by mouth every 7 (seven) days. 12  capsule 1   promethazine-dextromethorphan (PROMETHAZINE-DM) 6.25-15 MG/5ML syrup Take 5 mLs by mouth 4 (four) times daily as needed for cough. 118 mL 0   thiamine (VITAMIN B-1) 50 MG tablet Take 1 tablet (50 mg total) by mouth daily. (Patient not taking: Reported on 09/11/2023) 100 tablet 2   No facility-administered medications prior to visit.    No Known Allergies  ROS Review of Systems  Constitutional:  Positive for unexpected weight change. Negative for chills and fever.  HENT:  Negative for congestion, postnasal drip, sinus pressure and sore throat.   Eyes:  Negative for pain and discharge.  Respiratory:  Negative for cough and shortness of breath.   Cardiovascular:  Negative for chest pain and palpitations.  Gastrointestinal:  Negative for diarrhea, nausea and vomiting.  Endocrine: Negative for polydipsia and polyuria.  Genitourinary:  Negative for dysuria and hematuria.  Musculoskeletal:  Negative for neck pain and neck stiffness.  Skin:  Negative for rash.  Neurological:  Negative for dizziness, weakness, numbness and headaches.  Psychiatric/Behavioral:  Negative for agitation and behavioral problems.       Objective:    Physical Exam Vitals reviewed.  Constitutional:      General: He is not in acute distress.    Appearance: He is cachectic. He is not diaphoretic.  HENT:     Head: Normocephalic and atraumatic.     Nose: No congestion.     Mouth/Throat:     Mouth: Mucous membranes are moist.  Eyes:     General: No scleral icterus.    Extraocular Movements: Extraocular movements intact.  Cardiovascular:     Rate and Rhythm: Normal rate and regular rhythm.     Pulses: Normal pulses.     Heart sounds: Normal heart sounds. No murmur heard. Pulmonary:     Breath sounds: Normal breath sounds. No wheezing or rales.  Musculoskeletal:     Cervical back: Neck supple. No tenderness.     Right lower leg: No edema.     Left lower leg: No edema.  Skin:    General: Skin is  warm.     Findings: No rash.  Neurological:     General: No focal deficit present.     Mental Status: He is alert and oriented to person, place, and time.     Cranial Nerves: No cranial nerve deficit.     Sensory: No sensory deficit.     Motor: No weakness.  Psychiatric:        Mood and Affect: Mood normal.  Behavior: Behavior normal.     BP 118/68   Pulse 96   Ht 5\' 7"  (1.702 m)   Wt 112 lb 9.6 oz (51.1 kg)   SpO2 98%   BMI 17.64 kg/m  Wt Readings from Last 3 Encounters:  09/11/23 112 lb 9.6 oz (51.1 kg)  09/10/23 120 lb (54.4 kg)  07/29/23 114 lb 3.2 oz (51.8 kg)    Lab Results  Component Value Date   TSH 1.260 11/14/2022   Lab Results  Component Value Date   WBC 10.3 09/10/2023   HGB 12.1 (L) 09/10/2023   HCT 37.3 (L) 09/10/2023   MCV 108.1 (H) 09/10/2023   PLT 143 (L) 09/10/2023   Lab Results  Component Value Date   NA 142 09/10/2023   K 4.5 09/10/2023   CO2 13 (L) 09/10/2023   GLUCOSE 158 (H) 09/10/2023   BUN 20 09/10/2023   CREATININE 1.60 (H) 09/10/2023   BILITOT 2.5 (H) 09/10/2023   ALKPHOS 90 09/10/2023   AST 64 (H) 09/10/2023   ALT 21 09/10/2023   PROT 8.4 (H) 09/10/2023   ALBUMIN 4.4 09/10/2023   CALCIUM 8.2 (L) 09/10/2023   ANIONGAP 28 (H) 09/10/2023   EGFR 115 03/27/2023   Lab Results  Component Value Date   CHOL 174 11/14/2022   Lab Results  Component Value Date   HDL 85 11/14/2022   Lab Results  Component Value Date   LDLCALC 61 11/14/2022   Lab Results  Component Value Date   TRIG 171 (H) 11/14/2022   Lab Results  Component Value Date   CHOLHDL 2.0 11/14/2022   Lab Results  Component Value Date   HGBA1C 5.2 06/08/2021      Assessment & Plan:   Problem List Items Addressed This Visit       Digestive   Alcoholic liver disease (HCC)    Elevated liver enzymes in CMP, likely due to alcoholic liver disease in addition to dehydration Needs to cut down/avoid alcohol use Continue thiamine, folic acid and B12  supplement Maintain adequate hydration Check [place] RUQ      Relevant Orders   US Abdomen Limited RUQ (LIVER/GB)     Nervous and Auditory   Peripheral polyneuropathy    Likely due to alcohol abuse and nutritional deficiencies Continue thiamine, folic acid and B12 supplement Gabapentin was prescribed from the ER, advised to avoid alcohol use while taking gabapentin         Genitourinary   AKI (acute kidney injury) (HCC)    Likely due to dehydration from chronic alcohol use and poor p.o. intake of water Was given IV fluid in ER, now feeling better Needs to maintain adequate hydration Recheck CMP in the next visit        Other   Alcohol dependence with unspecified alcohol-induced disorder (HCC)    Takes about 3-4 alcoholic drinks in a day Needs to cut down, but he is not willing despite counseling AA support groups information provided      Macrocytic anemia    Likely due to alcohol abuse Continue folic acid and B12 supplement No signs of active bleeding      Encounter for examination following treatment at hospital - Primary    ER chart reviewed, including blood tests Was given IV fluids Gabapentin for neuropathy, advised to avoid alcohol use while taking gabapentin       No orders of the defined types were placed in this encounter.   Follow-up: Return if symptoms worsen or fail to  improve.    Anabel Halon, MD

## 2023-09-11 NOTE — Assessment & Plan Note (Signed)
Likely due to dehydration from chronic alcohol use and poor p.o. intake of water Was given IV fluid in ER, now feeling better Needs to maintain adequate hydration Recheck CMP in the next visit

## 2023-09-11 NOTE — Patient Instructions (Addendum)
Please take Biotin 50 mg once daily, folic acid 500 mcg once daily and Vitamin B12 500 mcg once daily.  Please avoid alcohol use.  Okay to take Gabapentin once daily at bedtime.

## 2023-09-23 ENCOUNTER — Ambulatory Visit (HOSPITAL_COMMUNITY)
Admission: RE | Admit: 2023-09-23 | Discharge: 2023-09-23 | Disposition: A | Discharge status: Home / Self Care | Payer: BC Managed Care – PPO | Source: Ambulatory Visit | Attending: Internal Medicine | Admitting: Internal Medicine

## 2023-09-23 DIAGNOSIS — K709 Alcoholic liver disease, unspecified: Secondary | ICD-10-CM | POA: Diagnosis present

## 2023-11-05 ENCOUNTER — Other Ambulatory Visit: Payer: Self-pay

## 2023-11-05 ENCOUNTER — Observation Stay (HOSPITAL_COMMUNITY)
Admission: EM | Admit: 2023-11-05 | Discharge: 2023-11-06 | Disposition: A | Discharge status: Home / Self Care | Payer: BC Managed Care – PPO | Attending: Internal Medicine | Admitting: Internal Medicine

## 2023-11-05 ENCOUNTER — Emergency Department (HOSPITAL_COMMUNITY): Payer: BC Managed Care – PPO

## 2023-11-05 ENCOUNTER — Encounter (HOSPITAL_COMMUNITY): Payer: Self-pay | Admitting: Internal Medicine

## 2023-11-05 DIAGNOSIS — F10129 Alcohol abuse with intoxication, unspecified: Secondary | ICD-10-CM | POA: Diagnosis not present

## 2023-11-05 DIAGNOSIS — T68XXXA Hypothermia, initial encounter: Principal | ICD-10-CM

## 2023-11-05 DIAGNOSIS — Z96641 Presence of right artificial hip joint: Secondary | ICD-10-CM | POA: Diagnosis not present

## 2023-11-05 DIAGNOSIS — Z79899 Other long term (current) drug therapy: Secondary | ICD-10-CM | POA: Insufficient documentation

## 2023-11-05 DIAGNOSIS — R680 Hypothermia, not associated with low environmental temperature: Secondary | ICD-10-CM | POA: Insufficient documentation

## 2023-11-05 DIAGNOSIS — Z1152 Encounter for screening for COVID-19: Secondary | ICD-10-CM | POA: Diagnosis not present

## 2023-11-05 DIAGNOSIS — F17210 Nicotine dependence, cigarettes, uncomplicated: Secondary | ICD-10-CM | POA: Insufficient documentation

## 2023-11-05 DIAGNOSIS — G9341 Metabolic encephalopathy: Secondary | ICD-10-CM | POA: Diagnosis not present

## 2023-11-05 DIAGNOSIS — I10 Essential (primary) hypertension: Secondary | ICD-10-CM | POA: Diagnosis present

## 2023-11-05 DIAGNOSIS — E162 Hypoglycemia, unspecified: Secondary | ICD-10-CM

## 2023-11-05 LAB — COMPREHENSIVE METABOLIC PANEL
ALT: 24 U/L (ref 0–44)
AST: 110 U/L — ABNORMAL HIGH (ref 15–41)
Albumin: 4.2 g/dL (ref 3.5–5.0)
Alkaline Phosphatase: 85 U/L (ref 38–126)
Anion gap: 19 — ABNORMAL HIGH (ref 5–15)
BUN: 12 mg/dL (ref 6–20)
CO2: 17 mmol/L — ABNORMAL LOW (ref 22–32)
Calcium: 8.5 mg/dL — ABNORMAL LOW (ref 8.9–10.3)
Chloride: 103 mmol/L (ref 98–111)
Creatinine, Ser: 0.75 mg/dL (ref 0.61–1.24)
GFR, Estimated: >60 mL/min (ref >60)
Glucose, Bld: 192 mg/dL — ABNORMAL HIGH (ref 70–99)
Potassium: 3.6 mmol/L (ref 3.5–5.1)
Sodium: 139 mmol/L (ref 135–145)
Total Bilirubin: 1.1 mg/dL (ref <1.2)
Total Protein: 7.4 g/dL (ref 6.5–8.1)

## 2023-11-05 LAB — RAPID URINE DRUG SCREEN, HOSP PERFORMED
Amphetamines: NOT DETECTED
Barbiturates: NOT DETECTED
Benzodiazepines: NOT DETECTED
Cocaine: NOT DETECTED
Opiates: NOT DETECTED
Tetrahydrocannabinol: NOT DETECTED

## 2023-11-05 LAB — URINALYSIS, W/ REFLEX TO CULTURE (INFECTION SUSPECTED)
Bilirubin Urine: NEGATIVE
Glucose, UA: 150 mg/dL — AB
Hgb urine dipstick: NEGATIVE
Ketones, ur: 20 mg/dL — AB
Leukocytes,Ua: NEGATIVE
Nitrite: NEGATIVE
Protein, ur: 30 mg/dL — AB
Specific Gravity, Urine: 1.013 (ref 1.005–1.030)
pH: 5 (ref 5.0–8.0)

## 2023-11-05 LAB — PROTIME-INR
INR: 1 (ref 0.8–1.2)
Prothrombin Time: 13.3 s (ref 11.4–15.2)

## 2023-11-05 LAB — CBC WITH DIFFERENTIAL/PLATELET
Abs Immature Granulocytes: 0.01 10*3/uL (ref 0.00–0.07)
Basophils Absolute: 0 10*3/uL (ref 0.0–0.1)
Basophils Relative: 0 %
Eosinophils Absolute: 0 10*3/uL (ref 0.0–0.5)
Eosinophils Relative: 0 %
HCT: 36.8 % — ABNORMAL LOW (ref 39.0–52.0)
Hemoglobin: 12.4 g/dL — ABNORMAL LOW (ref 13.0–17.0)
Immature Granulocytes: 0 %
Lymphocytes Relative: 21 %
Lymphs Abs: 1 10*3/uL (ref 0.7–4.0)
MCH: 37 pg — ABNORMAL HIGH (ref 26.0–34.0)
MCHC: 33.7 g/dL (ref 30.0–36.0)
MCV: 109.9 fL — ABNORMAL HIGH (ref 80.0–100.0)
Monocytes Absolute: 0.3 10*3/uL (ref 0.1–1.0)
Monocytes Relative: 6 %
Neutro Abs: 3.5 10*3/uL (ref 1.7–7.7)
Neutrophils Relative %: 73 %
Platelets: 165 10*3/uL (ref 150–400)
RBC: 3.35 MIL/uL — ABNORMAL LOW (ref 4.22–5.81)
RDW: 12.8 % (ref 11.5–15.5)
WBC: 4.9 10*3/uL (ref 4.0–10.5)
nRBC: 0 % (ref 0.0–0.2)

## 2023-11-05 LAB — CBG MONITORING, ED
Glucose-Capillary: 87 mg/dL (ref 70–99)
Glucose-Capillary: 182 mg/dL — ABNORMAL HIGH (ref 70–99)

## 2023-11-05 LAB — RESP PANEL BY RT-PCR (RSV, FLU A&B, COVID)  RVPGX2
Influenza A by PCR: NEGATIVE
Influenza B by PCR: NEGATIVE
Resp Syncytial Virus by PCR: NEGATIVE
SARS Coronavirus 2 by RT PCR: NEGATIVE

## 2023-11-05 LAB — ETHANOL: Alcohol, Ethyl (B): 196 mg/dL — ABNORMAL HIGH (ref <10)

## 2023-11-05 LAB — LACTIC ACID, PLASMA
Lactic Acid, Venous: 4.5 mmol/L (ref 0.5–1.9)
Lactic Acid, Venous: 4.1 mmol/L (ref 0.5–1.9)

## 2023-11-05 LAB — APTT: aPTT: 24 s (ref 24–36)

## 2023-11-05 LAB — MRSA NEXT GEN BY PCR, NASAL: MRSA by PCR Next Gen: NOT DETECTED

## 2023-11-05 MED ORDER — CHLORHEXIDINE GLUCONATE CLOTH 2 % EX PADS
6.0000 | MEDICATED_PAD | Freq: Every day | CUTANEOUS | Status: DC
Start: 2023-11-05 — End: 2023-11-06
  Administered 2023-11-05: 6 via TOPICAL

## 2023-11-05 MED ORDER — ONDANSETRON HCL 4 MG/2ML IJ SOLN: 4.0000 mg | Freq: Four times a day (QID) | INTRAMUSCULAR | Status: DC | PRN

## 2023-11-05 MED ORDER — DEXTROSE 50 % IV SOLN
INTRAVENOUS | Status: AC
  Filled 2023-11-05: qty 50

## 2023-11-05 MED ORDER — ACETAMINOPHEN 650 MG RE SUPP: 650.0000 mg | Freq: Four times a day (QID) | RECTAL | Status: DC | PRN

## 2023-11-05 MED ORDER — NALOXONE HCL 2 MG/2ML IJ SOSY
PREFILLED_SYRINGE | INTRAMUSCULAR | Status: AC
  Filled 2023-11-05: qty 2

## 2023-11-05 MED ORDER — AMLODIPINE BESYLATE 5 MG PO TABS
5.0000 mg | ORAL_TABLET | Freq: Every day | ORAL | Status: DC
  Administered 2023-11-06: 5 mg via ORAL
  Filled 2023-11-05: qty 1

## 2023-11-05 MED ORDER — DEXTROSE 50 % IV SOLN
INTRAVENOUS | Status: AC
  Administered 2023-11-05: 50 mL via INTRAVENOUS
  Filled 2023-11-05: qty 50

## 2023-11-05 MED ORDER — METRONIDAZOLE 500 MG/100ML IV SOLN
500.0000 mg | Freq: Once | INTRAVENOUS | Status: AC
  Administered 2023-11-05: 500 mg via INTRAVENOUS
  Filled 2023-11-05: qty 100

## 2023-11-05 MED ORDER — ONDANSETRON HCL 4 MG PO TABS
4.0000 mg | ORAL_TABLET | Freq: Four times a day (QID) | ORAL | Status: DC | PRN
  Filled 2023-11-05: qty 1

## 2023-11-05 MED ORDER — ACETAMINOPHEN 325 MG PO TABS: 650.0000 mg | ORAL_TABLET | Freq: Four times a day (QID) | ORAL | Status: DC | PRN

## 2023-11-05 MED ORDER — DEXTROSE 50 % IV SOLN: 1.0000 | Freq: Once | INTRAVENOUS | Status: AC

## 2023-11-05 MED ORDER — NALOXONE HCL 4 MG/10ML IJ SOLN
2.0000 mg | Freq: Once | INTRAMUSCULAR | Status: AC
  Administered 2023-11-05: 2 mg via INTRAVENOUS
  Filled 2023-11-05: qty 5

## 2023-11-05 MED ORDER — SODIUM CHLORIDE 0.9 % IV BOLUS
500.0000 mL | Freq: Once | INTRAVENOUS | Status: AC
  Administered 2023-11-05: 500 mL via INTRAVENOUS

## 2023-11-05 MED ORDER — CEFTRIAXONE SODIUM 2 G IJ SOLR
2.0000 g | Freq: Once | INTRAMUSCULAR | Status: AC
  Administered 2023-11-05: 2 g via INTRAVENOUS
  Filled 2023-11-05: qty 20

## 2023-11-05 MED ORDER — DEXTROSE 50 % IV SOLN
25.0000 mL | Freq: Once | INTRAVENOUS | Status: AC
Start: 2023-11-05 — End: 2023-11-05
  Administered 2023-11-05: 25 mL via INTRAVENOUS
  Filled 2023-11-05: qty 50

## 2023-11-05 MED ORDER — DEXTROSE-SODIUM CHLORIDE 5-0.9 % IV SOLN: INTRAVENOUS | Status: DC

## 2023-11-05 MED ORDER — FOLIC ACID 1 MG PO TABS
1.0000 mg | ORAL_TABLET | Freq: Every day | ORAL | Status: DC
  Administered 2023-11-06: 1 mg via ORAL
  Filled 2023-11-05: qty 1

## 2023-11-05 MED ORDER — SODIUM CHLORIDE 0.9 % IV BOLUS
1000.0000 mL | Freq: Once | INTRAVENOUS | Status: AC
  Administered 2023-11-05: 1000 mL via INTRAVENOUS

## 2023-11-05 MED ORDER — ENOXAPARIN SODIUM 40 MG/0.4ML IJ SOSY
40.0000 mg | PREFILLED_SYRINGE | INTRAMUSCULAR | Status: DC
  Administered 2023-11-05: 40 mg via SUBCUTANEOUS
  Filled 2023-11-05: qty 0.4

## 2023-11-05 NOTE — ED Triage Notes (Signed)
INITIAL series of events for patient:  Arrived POV Unresponsive Needed totally assist to get into wheelchair Taken to a room Dr. Estell Harpin made aware Placed in bed using 4 staff members Dr. Estell Harpin bedside Placed on monitor  Follow-up triage: Provided by patient sister  Found down at home With unknown friends Family attempted to give patient water Pt drunk a little Then became unresponsive Hx of alcohol use

## 2023-11-05 NOTE — ED Notes (Signed)
ED TO INPATIENT HANDOFF REPORT  ED Nurse Name and Phone #: Cammy Copa, RN  S Name/Age/Gender Donald Stewart 52 y.o. male Room/Bed: APA04/APA04  Code Status   Code Status: Not on file  Home/SNF/Other Home Patient oriented to: A&O4 Is this baseline? Yes      Chief Complaint Acute metabolic encephalopathy [G93.41]  Triage Note INITIAL series of events for patient:  Arrived POV Unresponsive Needed totally assist to get into wheelchair Taken to a room Dr. Estell Harpin made aware Placed in bed using 4 staff members Dr. Estell Harpin bedside Placed on monitor  Follow-up triage: Provided by patient sister  Found down at home With unknown friends Family attempted to give patient water Pt drunk a little Then became unresponsive Hx of alcohol use     Allergies No Known Allergies  Level of Care/Admitting Diagnosis ED Disposition     ED Disposition  Admit   Condition  --   Comment  Hospital Area: [facility] [[id number]]  Level of Care: Stepdown [14]  Covid Evaluation: Asymptomatic - no recent exposure (last 10 days) testing not required  Diagnosis: Acute metabolic encephalopathy [8413244]  Admitting Physician: Erick Blinks [[id number]]  Attending Physician: Erick Blinks [[id number]]  Certification:: I certify this patient will need inpatient services for at least 2 midnights  Expected Medical Readiness: 11/07/2023          B Medical/Surgery History Past Medical History:  Diagnosis Date   Asthma    Avascular necrosis of hip, right (HCC) 06/08/2021   Hypercholesterolemia    Hypertension    Past Surgical History:  Procedure Laterality Date   TOTAL HIP ARTHROPLASTY Right      A IV Location/Drains/Wounds Patient Lines/Drains/Airways Status     Active Line/Drains/Airways     Name Placement date Placement time Site Days   Peripheral IV 11/05/23 20 G 1" Right Antecubital 11/05/23  1152  Antecubital  less than 1            Intake/Output Last 24  hours No intake or output data in the 24 hours ending 11/05/23 1432  Labs/Imaging Results for orders placed or performed during the hospital encounter of 11/05/23 (from the past 48 hours)  CBG monitoring, ED     Status: None   Collection Time: 11/05/23 11:57 AM  Result Value Ref Range   Glucose-Capillary 87 70 - 99 mg/dL    Comment: Glucose reference range applies only to samples taken after fasting for at least 8 hours.  CBC with Differential     Status: Abnormal   Collection Time: 11/05/23 12:17 PM  Result Value Ref Range   WBC 4.9 4.0 - 10.5 K/uL   RBC 3.35 (L) 4.22 - 5.81 MIL/uL   Hemoglobin 12.4 (L) 13.0 - 17.0 g/dL   HCT 47.4 (L) 25.9 - 56.3 %   MCV 109.9 (H) 80.0 - 100.0 fL   MCH 37.0 (H) 26.0 - 34.0 pg   MCHC 33.7 30.0 - 36.0 g/dL   RDW 87.5 64.3 - 32.9 %   Platelets 165 150 - 400 K/uL   nRBC 0.0 0.0 - 0.2 %   Neutrophils Relative % 73 %   Neutro Abs 3.5 1.7 - 7.7 K/uL   Lymphocytes Relative 21 %   Lymphs Abs 1.0 0.7 - 4.0 K/uL   Monocytes Relative 6 %   Monocytes Absolute 0.3 0.1 - 1.0 K/uL   Eosinophils Relative 0 %   Eosinophils Absolute 0.0 0.0 - 0.5 K/uL   Basophils Relative 0 %  Basophils Absolute 0.0 0.0 - 0.1 K/uL   Immature Granulocytes 0 %   Abs Immature Granulocytes 0.01 0.00 - 0.07 K/uL    Comment: Performed at [facility], [address] [place], [place] [address]  Comprehensive metabolic panel     Status: Abnormal   Collection Time: 11/05/23 12:17 PM  Result Value Ref Range   Sodium 139 135 - 145 mmol/L   Potassium 3.6 3.5 - 5.1 mmol/L   Chloride 103 98 - 111 mmol/L   CO2 17 (L) 22 - 32 mmol/L   Glucose, Bld 192 (H) 70 - 99 mg/dL    Comment: Glucose reference range applies only to samples taken after fasting for at least 8 hours.   BUN 12 6 - 20 mg/dL   Creatinine, Ser 6.21 0.61 - 1.24 mg/dL   Calcium 8.5 (L) 8.9 - 10.3 mg/dL   Total Protein 7.4 6.5 - 8.1 g/dL   Albumin 4.2 3.5 - 5.0 g/dL   AST 308 (H) 15 - 41 U/L   ALT 24 0 - 44 U/L    Alkaline Phosphatase 85 38 - 126 U/L   Total Bilirubin 1.1 <1.2 mg/dL   GFR, Estimated >65 >78 mL/min    Comment: (NOTE) Calculated using the CKD-EPI Creatinine Equation (2021)    Anion gap 19 (H) 5 - 15    Comment: Performed at [facility], [address] [place], [place] [address]  Ethanol     Status: Abnormal   Collection Time: 11/05/23 12:17 PM  Result Value Ref Range   Alcohol, Ethyl (B) 196 (H) <10 mg/dL    Comment: (NOTE) Lowest detectable limit for serum alcohol is 10 mg/dL.  For medical purposes only. Performed at [facility], [address] [place], [place] [address]   Protime-INR     Status: None   Collection Time: 11/05/23 12:17 PM  Result Value Ref Range   Prothrombin Time 13.3 11.4 - 15.2 seconds   INR 1.0 0.8 - 1.2    Comment: (NOTE) INR goal varies based on device and disease states. Performed at [facility], [address], [place], [place] [address]   APTT     Status: None   Collection Time: 11/05/23 12:17 PM  Result Value Ref Range   aPTT 24 24 - 36 seconds    Comment: Performed at [facility], [address], [place], [place] [address]  Rapid urine drug screen (hospital performed)     Status: None   Collection Time: 11/05/23 12:40 PM  Result Value Ref Range   Opiates NONE DETECTED NONE DETECTED   Cocaine NONE DETECTED NONE DETECTED   Benzodiazepines NONE DETECTED NONE DETECTED   Amphetamines NONE DETECTED NONE DETECTED   Tetrahydrocannabinol NONE DETECTED NONE DETECTED   Barbiturates NONE DETECTED NONE DETECTED    Comment: (NOTE) DRUG SCREEN FOR MEDICAL PURPOSES ONLY.  IF CONFIRMATION IS NEEDED FOR ANY PURPOSE, NOTIFY LAB WITHIN 5 DAYS.  LOWEST DETECTABLE LIMITS FOR URINE DRUG SCREEN Drug Class                     Cutoff (ng/mL) Amphetamine and metabolites    1000 Barbiturate and metabolites    200 Benzodiazepine                 200 Opiates and metabolites        300 Cocaine and metabolites        300 THC  50 Performed at [facility], [address], [place], [place] [address]   Urinalysis, w/ Reflex to Culture (Infection Suspected) -Urine, Clean Catch     Status: Abnormal   Collection Time: 11/05/23 12:40 PM  Result Value Ref Range   Specimen Source URINE, CLEAN CATCH    Color, Urine YELLOW YELLOW   APPearance CLEAR CLEAR   Specific Gravity, Urine 1.013 1.005 - 1.030   pH 5.0 5.0 - 8.0   Glucose, UA 150 (A) NEGATIVE mg/dL   Hgb urine dipstick NEGATIVE NEGATIVE   Bilirubin Urine NEGATIVE NEGATIVE   Ketones, ur 20 (A) NEGATIVE mg/dL   Protein, ur 30 (A) NEGATIVE mg/dL   Nitrite NEGATIVE NEGATIVE   Leukocytes,Ua NEGATIVE NEGATIVE   RBC / HPF 0-5 0 - 5 RBC/hpf   WBC, UA 0-5 0 - 5 WBC/hpf    Comment:        Reflex urine culture not performed if WBC <=10, OR if Squamous epithelial cells >5. If Squamous epithelial cells >5 suggest recollection.    Bacteria, UA RARE (A) NONE SEEN   Squamous Epithelial / HPF 0-5 0 - 5 /HPF   Mucus PRESENT    Hyaline Casts, UA PRESENT     Comment: Performed at [facility], [address] [place], [place] [address]  Resp panel by RT-PCR (RSV, Flu A&B, Covid) Urine, Clean Catch     Status: None   Collection Time: 11/05/23 12:41 PM   Specimen: Urine, Clean Catch; Nasal Swab  Result Value Ref Range   SARS Coronavirus 2 by RT PCR NEGATIVE NEGATIVE    Comment: (NOTE) SARS-CoV-2 target nucleic acids are NOT DETECTED.  The SARS-CoV-2 RNA is generally detectable in upper respiratory specimens during the acute phase of infection. The lowest concentration of SARS-CoV-2 viral copies this assay can detect is 138 copies/mL. A negative result does not preclude SARS-Cov-2 infection and should not be used as the sole basis for treatment or other patient management decisions. A negative result may occur with  improper specimen collection/handling, submission of specimen other than nasopharyngeal swab, presence of viral mutation(s) within the areas targeted by this  assay, and inadequate number of viral copies(<138 copies/mL). A negative result must be combined with clinical observations, patient history, and epidemiological information. The expected result is Negative.  Fact Sheet for Patients:  BloggerCourse.com  Fact Sheet for Healthcare Providers:  SeriousBroker.it  This test is no t yet approved or cleared by the [place] FDA and  has been authorized for detection and/or diagnosis of SARS-CoV-2 by FDA under an Emergency Use Authorization (EUA). This EUA will remain  in effect (meaning this test can be used) for the duration of the COVID-19 declaration under Section 564(b)(1) of the Act, 21 U.S.C.section 360bbb-3(b)(1), unless the authorization is terminated  or revoked sooner.       Influenza A by PCR NEGATIVE NEGATIVE   Influenza B by PCR NEGATIVE NEGATIVE    Comment: (NOTE) The Xpert Xpress SARS-CoV-2/FLU/RSV plus assay is intended as an aid in the diagnosis of influenza from Nasopharyngeal swab specimens and should not be used as a sole basis for treatment. Nasal washings and aspirates are unacceptable for Xpert Xpress SARS-CoV-2/FLU/RSV testing.  Fact Sheet for Patients: BloggerCourse.com  Fact Sheet for Healthcare Providers: SeriousBroker.it  This test is not yet approved or cleared by the [place] FDA and has been authorized for detection and/or diagnosis of SARS-CoV-2 by FDA under an Emergency Use Authorization (EUA). This EUA will remain in effect (meaning this test  can be used) for the duration of the COVID-19 declaration under Section 564(b)(1) of the Act, 21 U.S.C. section 360bbb-3(b)(1), unless the authorization is terminated or revoked.     Resp Syncytial Virus by PCR NEGATIVE NEGATIVE    Comment: (NOTE) Fact Sheet for Patients: BloggerCourse.com  Fact Sheet for Healthcare  Providers: SeriousBroker.it  This test is not yet approved or cleared by the [place] FDA and has been authorized for detection and/or diagnosis of SARS-CoV-2 by FDA under an Emergency Use Authorization (EUA). This EUA will remain in effect (meaning this test can be used) for the duration of the COVID-19 declaration under Section 564(b)(1) of the Act, 21 U.S.C. section 360bbb-3(b)(1), unless the authorization is terminated or revoked.  Performed at [facility], [address], [place], [place] [address]   Lactic acid, plasma     Status: Abnormal   Collection Time: 11/05/23 12:45 PM  Result Value Ref Range   Lactic Acid, Venous 4.5 (HH) 0.5 - 1.9 mmol/L    Comment: CRITICAL RESULT CALLED TO, READ BACK BY AND VERIFIED WITH M WHITE RN [phone] Marveen Reeks Performed at [facility], [address], [place], [place] [address]   Blood Culture (routine x 2)     Status: None (Preliminary result)   Collection Time: 11/05/23 12:45 PM   Specimen: Left Antecubital; Blood  Result Value Ref Range   Specimen Description LEFT ANTECUBITAL    Special Requests      BOTTLES DRAWN AEROBIC AND ANAEROBIC Blood Culture adequate volume Performed at [facility], [address] [place], [place] [address]    Culture PENDING    Report Status PENDING   Blood Culture (routine x 2)     Status: None (Preliminary result)   Collection Time: 11/05/23 12:53 PM   Specimen: BLOOD  Result Value Ref Range   Specimen Description BLOOD BLOOD RIGHT HAND    Special Requests      BOTTLES DRAWN AEROBIC AND ANAEROBIC Blood Culture adequate volume Performed at [facility], [address] [place], [place] [address]    Culture PENDING    Report Status PENDING   CBG monitoring, ED     Status: Abnormal   Collection Time: 11/05/23  2:04 PM  Result Value Ref Range   Glucose-Capillary 182 (H) 70 - 99 mg/dL    Comment: Glucose reference range applies only to samples taken after fasting for at  least 8 hours.   DG Chest Port 1 View Result Date: 11/05/2023 CLINICAL DATA:  Weakness EXAM: PORTABLE CHEST 1 VIEW COMPARISON:  06/12/2023 FINDINGS: Numerous leads and wires project over the chest. The costophrenic angles are minimally excluded. Distal right clavicular fracture again identified. Midline trachea. Normal heart size and mediastinal contours. No pneumothorax. No large pleural effusion. Diffuse peribronchial thickening. Clear lungs. IMPRESSION: 1. No acute cardiopulmonary disease. 2. Mild peribronchial thickening which may relate to chronic bronchitis or smoking. Electronically Signed   By: Jeronimo Greaves M.D.   On: 11/05/2023 12:30    Pending Labs Unresulted Labs (From admission, onward)     Start     Ordered   11/05/23 1226  Lactic acid, plasma  (Septic presentation on arrival (screening labs, nursing and treatment orders for obvious sepsis))  STAT Now then every 2 hours,   R (with STAT occurrences)      11/05/23 1226            Vitals/Pain Today's Vitals   11/05/23 1154 11/05/23 1200 11/05/23 1416  BP: 108/80 109/81 101/78  Pulse:  91 (!) 104 98  Resp: 16 14   Temp:  (!) 92.7 F (33.7 C) (!) 97.4 F (36.3 C)  TempSrc:  Rectal Oral  SpO2: 97% 97% 98%    Isolation Precautions No active isolations  Medications Medications  naloxone (NARCAN) 2 MG/2ML injection (  Canceled Entry 11/05/23 1303)  metroNIDAZOLE (FLAGYL) IVPB 500 mg (500 mg Intravenous New Bag/Given 11/05/23 1333)  dextrose 50 % solution 50 mL ( Intravenous Canceled Entry 11/05/23 1304)  naloxone HCl (NARCAN) injection 2 mg (2 mg Intravenous Given 11/05/23 1153)  sodium chloride 0.9 % bolus 500 mL (0 mLs Intravenous Stopped 11/05/23 1302)  dextrose 50 % solution 25 mL (25 mLs Intravenous Given 11/05/23 1224)  cefTRIAXone (ROCEPHIN) 2 g in sodium chloride 0.9 % 100 mL IVPB (0 g Intravenous Stopped 11/05/23 1350)  sodium chloride 0.9 % bolus 1,000 mL (1,000 mLs Intravenous New Bag/Given 11/05/23 1259)     Mobility walks     Focused Assessments Neuro Assessment Handoff:  Swallow screen pass? Yes          Neuro Assessment:   Neuro Checks:      Has TPA been given? No If patient is a Neuro Trauma and patient is going to OR before floor call report to 4N Charge nurse: [phone] or [phone]   R Recommendations: See Admitting Provider Note  Report given to:   Additional Notes:

## 2023-11-05 NOTE — H&P (Signed)
History and Physical    Donald Stewart YQI:[id number] DOB: 1971/01/12 DOA: 11/05/2023  PCP: Anabel Halon, MD   Patient coming from: Home  Chief Complaint: AMS/Unresponsive  HPI: Donald Stewart is a 52 y.o. male with medical history significant for hypertension, dyslipidemia, alcohol and tobacco abuse who presented to the ED apparently unresponsive per family members after significant amounts of alcohol abuse overnight.  He denies any pain, headaches, visual changes, or weakness in his arms or legs.  Not much history can be obtained at this time, but he does not seem to report any acute trauma or any other issues.  No recent illness, fevers, chills noted.   ED Course: Patient noted to have low CBG reading in the ED, but otherwise stable vital signs and some noted hypothermia.  He was given 2 L fluid bolus as well as some Narcan and dextrose with improvement in mentation noted.  Chest x-ray and urine analysis negative for any acute findings and blood cultures pending.  Lactic acidosis noted with lactic acid of 4.5 and 4.1 on repeat and he was empirically given some Flagyl and Rocephin.  His blood alcohol level was 196 and urine drug screen was otherwise negative.  Review of Systems: Reviewed as noted above, otherwise negative.  Past Medical History:  Diagnosis Date   Asthma    Avascular necrosis of hip, right (HCC) 06/08/2021   Hypercholesterolemia    Hypertension     Past Surgical History:  Procedure Laterality Date   TOTAL HIP ARTHROPLASTY Right      reports that he has been smoking cigarettes. He has a 10 pack-year smoking history. He has never used smokeless tobacco. He reports current alcohol use. He reports that he does not use drugs.  No Known Allergies  Family History  Problem Relation Age of Onset   Hypertension Mother     Prior to Admission medications   Medication Sig Start Date End Date Taking? Authorizing Provider  albuterol (VENTOLIN HFA) 108 (90  Base) MCG/ACT inhaler Inhale 2 puffs into the lungs every 6 (six) hours as needed for wheezing or shortness of breath. 07/29/23  Yes Anabel Halon, MD  amLODipine (NORVASC) 5 MG tablet Take 1 tablet (5 mg total) by mouth daily. 07/29/23  Yes Anabel Halon, MD  folic acid (FOLVITE) 1 MG tablet Take 1 tablet (1 mg total) by mouth daily. 07/29/23  Yes Anabel Halon, MD  potassium chloride SA (KLOR-CON M) 20 MEQ tablet TAKE 1 TABLET(20 MEQ) BY MOUTH DAILY 08/07/23  Yes Anabel Halon, MD  fluticasone (FLONASE) 50 MCG/ACT nasal spray Place 2 sprays into both nostrils daily. 07/04/22   Anabel Halon, MD  ibuprofen (ADVIL) 800 MG tablet Take 800 mg by mouth 3 (three) times daily. 05/14/23   [provider]    Physical Exam: Vitals:   11/05/23 1154 11/05/23 1200 11/05/23 1416 11/05/23 1510  BP: 108/80 109/81 101/78 125/82  Pulse: 91 (!) 104 98   Resp: 16 14    Temp:  (!) 92.7 F (33.7 C) (!) 97.4 F (36.3 C) 97.9 F (36.6 C)  TempSrc:  Rectal Oral Oral  SpO2: 97% 97% 98% 99%  Weight:    49.5 kg  Height:    5\' 7"  (1.702 m)    Constitutional: NAD, calm, comfortable Vitals:   11/05/23 1154 11/05/23 1200 11/05/23 1416 11/05/23 1510  BP: 108/80 109/81 101/78 125/82  Pulse: 91 (!) 104 98   Resp: 16 14    Temp:  (!)  92.7 F (33.7 C) (!) 97.4 F (36.3 C) 97.9 F (36.6 C)  TempSrc:  Rectal Oral Oral  SpO2: 97% 97% 98% 99%  Weight:    49.5 kg  Height:    5\' 7"  (1.702 m)   Eyes: lids and conjunctivae normal Neck: normal, supple Respiratory: clear to auscultation bilaterally. Normal respiratory effort. No accessory muscle use.  Cardiovascular: Regular rate and rhythm, no murmurs. Abdomen: no tenderness, no distention. Bowel sounds positive.  Musculoskeletal:  No edema. Skin: no rashes, lesions, ulcers.  Psychiatric: Flat affect  Labs on Admission: I have personally reviewed following labs and imaging studies  CBC: Recent Labs  Lab 11/05/23 1217  WBC 4.9  NEUTROABS 3.5   HGB 12.4*  HCT 36.8*  MCV 109.9*  PLT 165   Basic Metabolic Panel: Recent Labs  Lab 11/05/23 1217  NA 139  K 3.6  CL 103  CO2 17*  GLUCOSE 192*  BUN 12  CREATININE 0.75  CALCIUM 8.5*   GFR: Estimated Creatinine Clearance: 74.8 mL/min (by C-G formula based on SCr of 0.75 mg/dL). Liver Function Tests: Recent Labs  Lab 11/05/23 1217  AST 110*  ALT 24  ALKPHOS 85  BILITOT 1.1  PROT 7.4  ALBUMIN 4.2   No results for input(s): "LIPASE", "AMYLASE" in the last 168 hours. No results for input(s): "AMMONIA" in the last 168 hours. Coagulation Profile: Recent Labs  Lab 11/05/23 1217  INR 1.0   Cardiac Enzymes: No results for input(s): "CKTOTAL", "CKMB", "CKMBINDEX", "TROPONINI" in the last 168 hours. BNP (last 3 results) No results for input(s): "PROBNP" in the last 8760 hours. HbA1C: No results for input(s): "HGBA1C" in the last 72 hours. CBG: Recent Labs  Lab 11/05/23 1157 11/05/23 1404  GLUCAP 87 182*   Lipid Profile: No results for input(s): "CHOL", "HDL", "LDLCALC", "TRIG", "CHOLHDL", "LDLDIRECT" in the last 72 hours. Thyroid Function Tests: No results for input(s): "TSH", "T4TOTAL", "FREET4", "T3FREE", "THYROIDAB" in the last 72 hours. Anemia Panel: No results for input(s): "VITAMINB12", "FOLATE", "FERRITIN", "TIBC", "IRON", "RETICCTPCT" in the last 72 hours. Urine analysis:    Component Value Date/Time   COLORURINE YELLOW 11/05/2023 1240   APPEARANCEUR CLEAR 11/05/2023 1240   LABSPEC 1.013 11/05/2023 1240   PHURINE 5.0 11/05/2023 1240   GLUCOSEU 150 (A) 11/05/2023 1240   HGBUR NEGATIVE 11/05/2023 1240   BILIRUBINUR NEGATIVE 11/05/2023 1240   KETONESUR 20 (A) 11/05/2023 1240   PROTEINUR 30 (A) 11/05/2023 1240   NITRITE NEGATIVE 11/05/2023 1240   LEUKOCYTESUR NEGATIVE 11/05/2023 1240    Radiological Exams on Admission: DG Chest Port 1 View Result Date: 11/05/2023 CLINICAL DATA:  Weakness EXAM: PORTABLE CHEST 1 VIEW COMPARISON:  06/12/2023  FINDINGS: Numerous leads and wires project over the chest. The costophrenic angles are minimally excluded. Distal right clavicular fracture again identified. Midline trachea. Normal heart size and mediastinal contours. No pneumothorax. No large pleural effusion. Diffuse peribronchial thickening. Clear lungs. IMPRESSION: 1. No acute cardiopulmonary disease. 2. Mild peribronchial thickening which may relate to chronic bronchitis or smoking. Electronically Signed   By: Jeronimo Greaves M.D.   On: 11/05/2023 12:30    EKG: Independently reviewed. SR 97bpm.  Assessment/Plan Principal Problem:   Acute metabolic encephalopathy    Acute metabolic encephalopathy secondary to alcohol intoxication -Also noted to have some hypoglycemia on arrival -Continue to monitor closely -No need for further imaging at this time  Hypothermia/lactic acidosis -Likely secondary to above -Continue on some gentle IV fluid -Recheck lactic acid in a.m.  Hypertension -Continue home  antihypertensives  Dyslipidemia  Alcohol/tobacco abuse -Counseled on cessation   DVT prophylaxis: Lovenox Code Status: Full Family Communication: None at bedside Disposition Plan: Admit for evaluation of AMS Consults called: None Admission status: Inpatient, SDU  Severity of Illness: The appropriate patient status for this patient is INPATIENT. Inpatient status is judged to be reasonable and necessary in order to provide the required intensity of service to ensure the patient's safety. The patient's presenting symptoms, physical exam findings, and initial radiographic and laboratory data in the context of their chronic comorbidities is felt to place them at high risk for further clinical deterioration. Furthermore, it is not anticipated that the patient will be medically stable for discharge from the hospital within 2 midnights of admission.   * I certify that at the point of admission it is my clinical judgment that the patient will  require inpatient hospital care spanning beyond 2 midnights from the point of admission due to high intensity of service, high risk for further deterioration and high frequency of surveillance required.*   Mckinzi Eriksen D Shyne Resch DO [facility]  If 7PM-7AM, please contact night-coverage [email or web address]  11/05/2023, 4:22 PM

## 2023-11-05 NOTE — Sepsis Progress Note (Addendum)
Elink following code sepsis  1442 Notified bedside nurse of need to draw repeat lactic acid.

## 2023-11-05 NOTE — ED Provider Notes (Signed)
[place] EMERGENCY DEPARTMENT AT [facility] Provider Note   CSN: [id number] Arrival date & time: 11/05/23  1147     History  Chief Complaint  Patient presents with   Altered Mental Status    Donald Stewart is a 52 y.o. male.  Patient was brought into the emergency department with significant altered mental status.  Patient has significant history of EtOH abuse.  The history is provided by a relative. No language interpreter was used.  Altered Mental Status Presenting symptoms: behavior changes   Severity:  Severe Most recent episode:  Today Episode history:  Continuous Timing:  Constant Progression:  Waxing and waning Chronicity:  Recurrent Context: alcohol use   Associated symptoms: no abdominal pain        Home Medications Prior to Admission medications   Medication Sig Start Date End Date Taking? Authorizing Provider  albuterol (VENTOLIN HFA) 108 (90 Base) MCG/ACT inhaler Inhale 2 puffs into the lungs every 6 (six) hours as needed for wheezing or shortness of breath. 07/29/23  Yes Anabel Halon, MD  amLODipine (NORVASC) 5 MG tablet Take 1 tablet (5 mg total) by mouth daily. 07/29/23  Yes Anabel Halon, MD  folic acid (FOLVITE) 1 MG tablet Take 1 tablet (1 mg total) by mouth daily. 07/29/23  Yes Anabel Halon, MD  potassium chloride SA (KLOR-CON M) 20 MEQ tablet TAKE 1 TABLET(20 MEQ) BY MOUTH DAILY 08/07/23  Yes Anabel Halon, MD  fluticasone (FLONASE) 50 MCG/ACT nasal spray Place 2 sprays into both nostrils daily. 07/04/22   Anabel Halon, MD  ibuprofen (ADVIL) 800 MG tablet Take 800 mg by mouth 3 (three) times daily. 05/14/23   [provider]      Allergies    Patient has no known allergies.    Review of Systems   Review of Systems  Unable to perform ROS: Mental status change  Gastrointestinal:  Negative for abdominal pain.    Physical Exam Updated Vital Signs BP 101/78   Pulse 98   Temp (!) 97.4 F (36.3 C) (Oral)    Resp 14   SpO2 98%  Physical Exam Vitals and nursing note reviewed.  Constitutional:      Appearance: He is well-developed.     Comments: Responding to painful stimuli only  HENT:     Head: Normocephalic.     Nose: Nose normal.  Eyes:     General: No scleral icterus.    Conjunctiva/sclera: Conjunctivae normal.  Neck:     Thyroid: No thyromegaly.  Cardiovascular:     Rate and Rhythm: Normal rate and regular rhythm.     Heart sounds: No murmur heard.    No friction rub. No gallop.  Pulmonary:     Breath sounds: No stridor. No wheezing or rales.  Chest:     Chest wall: No tenderness.  Abdominal:     General: There is no distension.     Tenderness: There is no abdominal tenderness. There is no rebound.  Musculoskeletal:        General: Normal range of motion.     Cervical back: Neck supple.  Lymphadenopathy:     Cervical: No cervical adenopathy.  Skin:    Findings: No erythema or rash.  Neurological:     Motor: No abnormal muscle tone.     Coordination: Coordination normal.     ED Results / Procedures / Treatments   Labs (all labs ordered are listed, but only abnormal results are displayed) Labs  Reviewed  CBC WITH DIFFERENTIAL/PLATELET - Abnormal; Notable for the following components:      Result Value   RBC 3.35 (*)    Hemoglobin 12.4 (*)    HCT 36.8 (*)    MCV 109.9 (*)    MCH 37.0 (*)    All other components within normal limits  COMPREHENSIVE METABOLIC PANEL - Abnormal; Notable for the following components:   CO2 17 (*)    Glucose, Bld 192 (*)    Calcium 8.5 (*)    AST 110 (*)    Anion gap 19 (*)    All other components within normal limits  ETHANOL - Abnormal; Notable for the following components:   Alcohol, Ethyl (B) 196 (*)    All other components within normal limits  LACTIC ACID, PLASMA - Abnormal; Notable for the following components:   Lactic Acid, Venous 4.5 (*)    All other components within normal limits  URINALYSIS, W/ REFLEX TO CULTURE  (INFECTION SUSPECTED) - Abnormal; Notable for the following components:   Glucose, UA 150 (*)    Ketones, ur 20 (*)    Protein, ur 30 (*)    Bacteria, UA RARE (*)    All other components within normal limits  CBG MONITORING, ED - Abnormal; Notable for the following components:   Glucose-Capillary 182 (*)    All other components within normal limits  RESP PANEL BY RT-PCR (RSV, FLU A&B, COVID)  RVPGX2  CULTURE, BLOOD (ROUTINE X 2)  CULTURE, BLOOD (ROUTINE X 2)  RAPID URINE DRUG SCREEN, HOSP PERFORMED  PROTIME-INR  APTT  LACTIC ACID, PLASMA  CBG MONITORING, ED  I-STAT CHEM 8, ED    EKG None  Radiology DG Chest Port 1 View Result Date: 11/05/2023 CLINICAL DATA:  Weakness EXAM: PORTABLE CHEST 1 VIEW COMPARISON:  06/12/2023 FINDINGS: Numerous leads and wires project over the chest. The costophrenic angles are minimally excluded. Distal right clavicular fracture again identified. Midline trachea. Normal heart size and mediastinal contours. No pneumothorax. No large pleural effusion. Diffuse peribronchial thickening. Clear lungs. IMPRESSION: 1. No acute cardiopulmonary disease. 2. Mild peribronchial thickening which may relate to chronic bronchitis or smoking. Electronically Signed   By: Jeronimo Greaves M.D.   On: 11/05/2023 12:30    Procedures Procedures    Medications Ordered in ED Medications  naloxone [facility]) 2 MG/2ML injection (  Canceled Entry 11/05/23 1303)  metroNIDAZOLE (FLAGYL) IVPB 500 mg (500 mg Intravenous New Bag/Given 11/05/23 1333)  dextrose 50 % solution 50 mL ( Intravenous Canceled Entry 11/05/23 1304)  naloxone HCl (NARCAN) injection 2 mg (2 mg Intravenous Given 11/05/23 1153)  sodium chloride 0.9 % bolus 500 mL (0 mLs Intravenous Stopped 11/05/23 1302)  dextrose 50 % solution 25 mL (25 mLs Intravenous Given 11/05/23 1224)  cefTRIAXone (ROCEPHIN) 2 g in sodium chloride 0.9 % 100 mL IVPB (0 g Intravenous Stopped 11/05/23 1350)  sodium chloride 0.9 % bolus 1,000 mL  (1,000 mLs Intravenous New Bag/Given 11/05/23 1259)    ED Course/ Medical Decision Making/ A&P CRITICAL CARE Performed by: Bethann Berkshire Total critical care time: 40 minutes Critical care time was exclusive of separately billable procedures and treating other patients. Critical care was necessary to treat or prevent imminent or life-threatening deterioration. Critical care was time spent personally by me on the following activities: development of treatment plan with patient and/or surrogate as well as nursing, discussions with consultants, evaluation of patient's response to treatment, examination of patient, obtaining history from patient or surrogate, ordering and performing  treatments and interventions, ordering and review of laboratory studies, ordering and review of radiographic studies, pulse oximetry and re-evaluation of patient's condition.   Patient brought to the emergency department with altered mental status that was most likely related to hypothermia, hypoglycemia and EtOH abuse.  Patient improved with D50 and a warming blanket Click here for ABCD2, HEART and other calculatorsREFRESH Note before signing :1}                              Medical Decision Making Amount and/or Complexity of Data Reviewed Labs: ordered. Radiology: ordered. ECG/medicine tests: ordered.  Risk Prescription drug management. Decision regarding hospitalization.  This patient presents to the ED for concern of altered mental status, this involves an extensive number of treatment options, and is a complaint that carries with it a high risk of complications and morbidity.  The differential diagnosis includes EtOH and stroke   Co morbidities that complicate the patient evaluation  EtOH   Additional history obtained:  Additional history obtained from family External records from outside source obtained and reviewed including records   Lab Tests:  I Ordered, and personally interpreted labs.  The  pertinent results include: Alcohol level about 200 and glucose less than 10   Imaging Studies ordered:  I ordered imaging studies including chest x-ray I independently visualized and interpreted imaging which showed negative I agree with the radiologist interpretation   Cardiac Monitoring: / EKG:  The patient was maintained on a cardiac monitor.  I personally viewed and interpreted the cardiac monitored which showed an underlying rhythm of: Sinus tach   Consultations Obtained:  I requested consultation with the hospitalist,  and discussed lab and imaging findings as well as pertinent plan - they recommend: Admit    Problem List / ED Course / Critical interventions / Medication management  EtOH abuse and hypoglycemia and hypothermia I ordered medication including D50 and sepsis workup with antibiotics for hypothermia Reevaluation of the patient after these medicines showed that the patient improved I have reviewed the patients home medicines and have made adjustments as needed   Social Determinants of Health:  None   Test / Admission - Considered:  None  Altered mental status secondary to hypoglycemia and hypothermia and EtOH abuse        Final Clinical Impression(s) / ED Diagnoses Final diagnoses:  Hypothermia, initial encounter  Hypoglycemia    Rx / DC Orders ED Discharge Orders     None         Bethann Berkshire, MD 11/08/23 1029

## 2023-11-05 NOTE — Plan of Care (Signed)

## 2023-11-06 DIAGNOSIS — G9341 Metabolic encephalopathy: Secondary | ICD-10-CM | POA: Diagnosis not present

## 2023-11-06 LAB — CBC
HCT: 30.7 % — ABNORMAL LOW (ref 39.0–52.0)
Hemoglobin: 10.7 g/dL — ABNORMAL LOW (ref 13.0–17.0)
MCH: 36.6 pg — ABNORMAL HIGH (ref 26.0–34.0)
MCHC: 34.9 g/dL (ref 30.0–36.0)
MCV: 105.1 fL — ABNORMAL HIGH (ref 80.0–100.0)
Platelets: 128 10*3/uL — ABNORMAL LOW (ref 150–400)
RBC: 2.92 MIL/uL — ABNORMAL LOW (ref 4.22–5.81)
RDW: 12.4 % (ref 11.5–15.5)
WBC: 4.7 10*3/uL (ref 4.0–10.5)
nRBC: 0 % (ref 0.0–0.2)

## 2023-11-06 LAB — IRON AND TIBC
Iron: 162 ug/dL (ref 45–182)
Saturation Ratios: 89 % — ABNORMAL HIGH (ref 17.9–39.5)
TIBC: 181 ug/dL — ABNORMAL LOW (ref 250–450)
UIBC: 19 ug/dL

## 2023-11-06 LAB — BASIC METABOLIC PANEL
Anion gap: 12 (ref 5–15)
BUN: <5 mg/dL — ABNORMAL LOW (ref 6–20)
CO2: 23 mmol/L (ref 22–32)
Calcium: 8 mg/dL — ABNORMAL LOW (ref 8.9–10.3)
Chloride: 101 mmol/L (ref 98–111)
Creatinine, Ser: 0.61 mg/dL (ref 0.61–1.24)
GFR, Estimated: >60 mL/min (ref >60)
Glucose, Bld: 97 mg/dL (ref 70–99)
Potassium: 3.3 mmol/L — ABNORMAL LOW (ref 3.5–5.1)
Sodium: 136 mmol/L (ref 135–145)

## 2023-11-06 LAB — RETICULOCYTES
Immature Retic Fract: 9.4 % (ref 2.3–15.9)
RBC.: 2.91 MIL/uL — ABNORMAL LOW (ref 4.22–5.81)
Retic Count, Absolute: 43.9 10*3/uL (ref 19.0–186.0)
Retic Ct Pct: 1.5 % (ref 0.4–3.1)

## 2023-11-06 LAB — HIV ANTIBODY (ROUTINE TESTING W REFLEX): HIV Screen 4th Generation wRfx: NONREACTIVE

## 2023-11-06 LAB — FOLATE: Folate: 7.2 ng/mL (ref >5.9)

## 2023-11-06 LAB — FERRITIN: Ferritin: 726 ng/mL — ABNORMAL HIGH (ref 24–336)

## 2023-11-06 LAB — MAGNESIUM: Magnesium: 1.2 mg/dL — ABNORMAL LOW (ref 1.7–2.4)

## 2023-11-06 LAB — VITAMIN B12: Vitamin B-12: 373 pg/mL (ref 180–914)

## 2023-11-06 LAB — LACTIC ACID, PLASMA: Lactic Acid, Venous: 0.9 mmol/L (ref 0.5–1.9)

## 2023-11-06 MED ORDER — CHLORDIAZEPOXIDE HCL 10 MG PO CAPS: 10.0000 mg | ORAL_CAPSULE | Freq: Three times a day (TID) | ORAL | 0 refills | Status: DC | PRN

## 2023-11-06 MED ORDER — MAGNESIUM SULFATE 2 GM/50ML IV SOLN
2.0000 g | Freq: Once | INTRAVENOUS | Status: AC
  Administered 2023-11-06: 2 g via INTRAVENOUS
  Filled 2023-11-06: qty 50

## 2023-11-06 MED ORDER — POTASSIUM CHLORIDE CRYS ER 20 MEQ PO TBCR
40.0000 meq | EXTENDED_RELEASE_TABLET | Freq: Once | ORAL | Status: AC
  Administered 2023-11-06: 40 meq via ORAL
  Filled 2023-11-06: qty 2

## 2023-11-06 NOTE — Progress Notes (Signed)
   11/06/23 0859  TOC Brief Assessment  Insurance and Status Reviewed  Patient has primary care physician Yes  Home environment has been reviewed from home  Prior level of function: independent  Prior/Current Home Services No current home services  Social Drivers of Health Review SDOH reviewed no interventions necessary  Readmission risk has been reviewed Yes  Transition of care needs no transition of care needs at this time    Reviewed pt's record. SA treatment resource information added to pt's AVS.  Transition of Care Department [facility]) has reviewed patient and no other TOC needs have been identified at this time. We will continue to monitor patient advancement through interdisciplinary progression rounds. If new patient transition needs arise, please place a TOC consult.

## 2023-11-06 NOTE — Discharge Summary (Signed)
Physician Discharge Summary  Donald Stewart GLO:[id number] DOB: 1971/09/25 DOA: 11/05/2023  PCP: Anabel Halon, MD  Admit date: 11/05/2023  Discharge date: 11/06/2023  Admitted From:Home  Disposition:  Home  Recommendations for Outpatient Follow-up:  Follow up with PCP in 1-2 weeks Patient counseled on alcohol cessation and given Librium as needed for withdrawal symptoms/anxiety Continue other home medications as prior  Home Health: None  Equipment/Devices: None  Discharge Condition:Stable  CODE STATUS: Full  Diet recommendation: Heart Healthy  Brief/Interim Summary:  Donald Stewart is a 52 y.o. male with medical history significant for hypertension, dyslipidemia, alcohol and tobacco abuse who presented to the ED apparently unresponsive per family members after significant amounts of alcohol abuse overnight.  He was admitted for acute metabolic encephalopathy secondary to alcohol intoxication and was also noted to have some hypothermia and lactic acidosis that have now subsequently improved.  He has been counseled extensively on alcohol cessation and is now in stable condition for discharge and will have Librium prescribed as needed to assist him with tapering/weaning.  No other acute events or concerns noted.  Discharge Diagnoses:  Principal Problem:   Acute metabolic encephalopathy  Principal discharge diagnosis: Acute metabolic encephalopathy secondary to alcohol intoxication.  Discharge Instructions  Discharge Instructions     Diet - low sodium heart healthy   Complete by: As directed    Increase activity slowly   Complete by: As directed       Allergies as of 11/06/2023   No Known Allergies      Medication List     TAKE these medications    albuterol 108 (90 Base) MCG/ACT inhaler Commonly known as: VENTOLIN HFA Inhale 2 puffs into the lungs every 6 (six) hours as needed for wheezing or shortness of breath.   amLODipine 5 MG  tablet Commonly known as: NORVASC Take 1 tablet (5 mg total) by mouth daily.   chlordiazePOXIDE 10 MG capsule Commonly known as: LIBRIUM Take 1 capsule (10 mg total) by mouth 3 (three) times daily as needed for anxiety or withdrawal.   fluticasone 50 MCG/ACT nasal spray Commonly known as: FLONASE Place 2 sprays into both nostrils daily.   folic acid 1 MG tablet Commonly known as: FOLVITE Take 1 tablet (1 mg total) by mouth daily.   ibuprofen 800 MG tablet Commonly known as: ADVIL Take 800 mg by mouth 3 (three) times daily.   potassium chloride SA 20 MEQ tablet Commonly known as: KLOR-CON M TAKE 1 TABLET(20 MEQ) BY MOUTH DAILY        Follow-up Information     Anabel Halon, MD. Schedule an appointment as soon as possible for a visit in 1 week(s).   Specialty: Internal Medicine Contact information: [address] [place] [address] [phone]                No Known Allergies  Consultations: None   Procedures/Studies: DG Chest Port 1 View Result Date: 11/05/2023 CLINICAL DATA:  Weakness EXAM: PORTABLE CHEST 1 VIEW COMPARISON:  06/12/2023 FINDINGS: Numerous leads and wires project over the chest. The costophrenic angles are minimally excluded. Distal right clavicular fracture again identified. Midline trachea. Normal heart size and mediastinal contours. No pneumothorax. No large pleural effusion. Diffuse peribronchial thickening. Clear lungs. IMPRESSION: 1. No acute cardiopulmonary disease. 2. Mild peribronchial thickening which may relate to chronic bronchitis or smoking. Electronically Signed   By: Jeronimo Greaves M.D.   On: 11/05/2023 12:30     Discharge Exam: Vitals:  11/06/23 0759 11/06/23 0800  BP: 121/75 121/75  Pulse: 75 72  Resp: 16 13  Temp: 98.4 F (36.9 C)   SpO2: 99% 100%   Vitals:   11/06/23 0600 11/06/23 0700 11/06/23 0759 11/06/23 0800  BP: 126/76 122/81 121/75 121/75  Pulse: 72 76 75 72  Resp: (!) 9 18 16 13   Temp:   98.4 F  (36.9 C)   TempSrc:   Oral   SpO2: 99% 98% 99% 100%  Weight:      Height:        General: Pt is alert, awake, not in acute distress Cardiovascular: RRR, S1/S2 +, no rubs, no gallops Respiratory: CTA bilaterally, no wheezing, no rhonchi Abdominal: Soft, NT, ND, bowel sounds + Extremities: no edema, no cyanosis    The results of significant diagnostics from this hospitalization (including imaging, microbiology, ancillary and laboratory) are listed below for reference.     Microbiology: Recent Results (from the past 240 hours)  Resp panel by RT-PCR (RSV, Flu A&B, Covid) Urine, Clean Catch     Status: None   Collection Time: 11/05/23 12:41 PM   Specimen: Urine, Clean Catch; Nasal Swab  Result Value Ref Range Status   SARS Coronavirus 2 by RT PCR NEGATIVE NEGATIVE Final    Comment: (NOTE) SARS-CoV-2 target nucleic acids are NOT DETECTED.  The SARS-CoV-2 RNA is generally detectable in upper respiratory specimens during the acute phase of infection. The lowest concentration of SARS-CoV-2 viral copies this assay can detect is 138 copies/mL. A negative result does not preclude SARS-Cov-2 infection and should not be used as the sole basis for treatment or other patient management decisions. A negative result may occur with  improper specimen collection/handling, submission of specimen other than nasopharyngeal swab, presence of viral mutation(s) within the areas targeted by this assay, and inadequate number of viral copies(<138 copies/mL). A negative result must be combined with clinical observations, patient history, and epidemiological information. The expected result is Negative.  Fact Sheet for Patients:  BloggerCourse.com  Fact Sheet for Healthcare Providers:  SeriousBroker.it  This test is no t yet approved or cleared by the [place] FDA and  has been authorized for detection and/or diagnosis of SARS-CoV-2 by FDA  under an Emergency Use Authorization (EUA). This EUA will remain  in effect (meaning this test can be used) for the duration of the COVID-19 declaration under Section 564(b)(1) of the Act, 21 U.S.C.section 360bbb-3(b)(1), unless the authorization is terminated  or revoked sooner.       Influenza A by PCR NEGATIVE NEGATIVE Final   Influenza B by PCR NEGATIVE NEGATIVE Final    Comment: (NOTE) The Xpert Xpress SARS-CoV-2/FLU/RSV plus assay is intended as an aid in the diagnosis of influenza from Nasopharyngeal swab specimens and should not be used as a sole basis for treatment. Nasal washings and aspirates are unacceptable for Xpert Xpress SARS-CoV-2/FLU/RSV testing.  Fact Sheet for Patients: BloggerCourse.com  Fact Sheet for Healthcare Providers: SeriousBroker.it  This test is not yet approved or cleared by the [place] FDA and has been authorized for detection and/or diagnosis of SARS-CoV-2 by FDA under an Emergency Use Authorization (EUA). This EUA will remain in effect (meaning this test can be used) for the duration of the COVID-19 declaration under Section 564(b)(1) of the Act, 21 U.S.C. section 360bbb-3(b)(1), unless the authorization is terminated or revoked.     Resp Syncytial Virus by PCR NEGATIVE NEGATIVE Final    Comment: (NOTE) Fact Sheet for Patients: BloggerCourse.com  Fact Sheet  for Healthcare Providers: SeriousBroker.it  This test is not yet approved or cleared by the [place] and has been authorized for detection and/or diagnosis of SARS-CoV-2 by FDA under an Emergency Use Authorization (EUA). This EUA will remain in effect (meaning this test can be used) for the duration of the COVID-19 declaration under Section 564(b)(1) of the Act, 21 U.S.C. section 360bbb-3(b)(1), unless the authorization is terminated or revoked.  Performed at [facility], [address] [place], [place] [address]   Blood Culture (routine x 2)     Status: None (Preliminary result)   Collection Time: 11/05/23 12:45 PM   Specimen: Left Antecubital; Blood  Result Value Ref Range Status   Specimen Description LEFT ANTECUBITAL  Final   Special Requests   Final    BOTTLES DRAWN AEROBIC AND ANAEROBIC Blood Culture adequate volume   Culture   Final    NO GROWTH < 24 HOURS Performed at [facility], [address] [place], [place] [address]    Report Status PENDING  Incomplete  Blood Culture (routine x 2)     Status: None (Preliminary result)   Collection Time: 11/05/23 12:53 PM   Specimen: BLOOD  Result Value Ref Range Status   Specimen Description BLOOD BLOOD RIGHT HAND  Final   Special Requests   Final    BOTTLES DRAWN AEROBIC AND ANAEROBIC Blood Culture adequate volume   Culture   Final    NO GROWTH < 24 HOURS Performed at [facility], [address], [place], [place] [address]    Report Status PENDING  Incomplete  MRSA Next Gen by PCR, Nasal     Status: None   Collection Time: 11/05/23  3:15 PM   Specimen: Nasal Mucosa; Nasal Swab  Result Value Ref Range Status   MRSA by PCR Next Gen NOT DETECTED NOT DETECTED Final    Comment: (NOTE) The GeneXpert MRSA Assay (FDA approved for NASAL specimens only), is one component of a comprehensive MRSA colonization surveillance program. It is not intended to diagnose MRSA infection nor to guide or monitor treatment for MRSA infections. Test performance is not FDA approved in patients less than 3 years old. Performed at [facility], [address], [place], [place] [address]      Labs: BNP (last 3 results) No results for input(s): "BNP" in the last 8760 hours. Basic Metabolic Panel: Recent Labs  Lab 11/05/23 1217 11/06/23 0505  NA 139 136  K 3.6 3.3*  CL 103 101  CO2 17* 23  GLUCOSE 192* 97  BUN 12 <5*  CREATININE 0.75 0.61  CALCIUM 8.5* 8.0*  MG  --  1.2*   Liver Function Tests: Recent  Labs  Lab 11/05/23 1217  AST 110*  ALT 24  ALKPHOS 85  BILITOT 1.1  PROT 7.4  ALBUMIN 4.2   No results for input(s): "LIPASE", "AMYLASE" in the last 168 hours. No results for input(s): "AMMONIA" in the last 168 hours. CBC: Recent Labs  Lab 11/05/23 1217 11/06/23 0505  WBC 4.9 4.7  NEUTROABS 3.5  --   HGB 12.4* 10.7*  HCT 36.8* 30.7*  MCV 109.9* 105.1*  PLT 165 128*   Cardiac Enzymes: No results for input(s): "CKTOTAL", "CKMB", "CKMBINDEX", "TROPONINI" in the last 168 hours. BNP: Invalid input(s): "POCBNP" CBG: Recent Labs  Lab 11/05/23 1157 11/05/23 1404  GLUCAP 87 182*   D-Dimer No results for input(s): "DDIMER" in the last 72 hours. Hgb A1c No results for input(s): "HGBA1C" in the last 72 hours.  Lipid Profile No results for input(s): "CHOL", "HDL", "LDLCALC", "TRIG", "CHOLHDL", "LDLDIRECT" in the last 72 hours. Thyroid function studies No results for input(s): "TSH", "T4TOTAL", "T3FREE", "THYROIDAB" in the last 72 hours.  Invalid input(s): "FREET3" Anemia work up Recent Labs    11/06/23 0505  VITAMINB12 373  FOLATE 7.2  FERRITIN 726*  TIBC 181*  IRON 162  RETICCTPCT 1.5   Urinalysis    Component Value Date/Time   COLORURINE YELLOW 11/05/2023 1240   APPEARANCEUR CLEAR 11/05/2023 1240   LABSPEC 1.013 11/05/2023 1240   PHURINE 5.0 11/05/2023 1240   GLUCOSEU 150 (A) 11/05/2023 1240   HGBUR NEGATIVE 11/05/2023 1240   BILIRUBINUR NEGATIVE 11/05/2023 1240   KETONESUR 20 (A) 11/05/2023 1240   PROTEINUR 30 (A) 11/05/2023 1240   NITRITE NEGATIVE 11/05/2023 1240   LEUKOCYTESUR NEGATIVE 11/05/2023 1240   Sepsis Labs Recent Labs  Lab 11/05/23 1217 11/06/23 0505  WBC 4.9 4.7   Microbiology Recent Results (from the past 240 hours)  Resp panel by RT-PCR (RSV, Flu A&B, Covid) Urine, Clean Catch     Status: None   Collection Time: 11/05/23 12:41 PM   Specimen: Urine, Clean Catch; Nasal Swab  Result Value Ref Range Status   SARS Coronavirus 2 by RT  PCR NEGATIVE NEGATIVE Final    Comment: (NOTE) SARS-CoV-2 target nucleic acids are NOT DETECTED.  The SARS-CoV-2 RNA is generally detectable in upper respiratory specimens during the acute phase of infection. The lowest concentration of SARS-CoV-2 viral copies this assay can detect is 138 copies/mL. A negative result does not preclude SARS-Cov-2 infection and should not be used as the sole basis for treatment or other patient management decisions. A negative result may occur with  improper specimen collection/handling, submission of specimen other than nasopharyngeal swab, presence of viral mutation(s) within the areas targeted by this assay, and inadequate number of viral copies(<138 copies/mL). A negative result must be combined with clinical observations, patient history, and epidemiological information. The expected result is Negative.  Fact Sheet for Patients:  BloggerCourse.com  Fact Sheet for Healthcare Providers:  SeriousBroker.it  This test is no t yet approved or cleared by the [place] FDA and  has been authorized for detection and/or diagnosis of SARS-CoV-2 by FDA under an Emergency Use Authorization (EUA). This EUA will remain  in effect (meaning this test can be used) for the duration of the COVID-19 declaration under Section 564(b)(1) of the Act, 21 U.S.C.section 360bbb-3(b)(1), unless the authorization is terminated  or revoked sooner.       Influenza A by PCR NEGATIVE NEGATIVE Final   Influenza B by PCR NEGATIVE NEGATIVE Final    Comment: (NOTE) The Xpert Xpress SARS-CoV-2/FLU/RSV plus assay is intended as an aid in the diagnosis of influenza from Nasopharyngeal swab specimens and should not be used as a sole basis for treatment. Nasal washings and aspirates are unacceptable for Xpert Xpress SARS-CoV-2/FLU/RSV testing.  Fact Sheet for Patients: BloggerCourse.com  Fact Sheet for  Healthcare Providers: SeriousBroker.it  This test is not yet approved or cleared by the [place] FDA and has been authorized for detection and/or diagnosis of SARS-CoV-2 by FDA under an Emergency Use Authorization (EUA). This EUA will remain in effect (meaning this test can be used) for the duration of the COVID-19 declaration under Section 564(b)(1) of the Act, 21 U.S.C. section 360bbb-3(b)(1), unless the authorization is terminated or revoked.     Resp Syncytial Virus by PCR NEGATIVE NEGATIVE Final    Comment: (NOTE) Fact Sheet for Patients:  BloggerCourse.com  Fact Sheet for Healthcare Providers: SeriousBroker.it  This test is not yet approved or cleared by the [place] FDA and has been authorized for detection and/or diagnosis of SARS-CoV-2 by FDA under an Emergency Use Authorization (EUA). This EUA will remain in effect (meaning this test can be used) for the duration of the COVID-19 declaration under Section 564(b)(1) of the Act, 21 U.S.C. section 360bbb-3(b)(1), unless the authorization is terminated or revoked.  Performed at [facility], [address] [place], [place] [address]   Blood Culture (routine x 2)     Status: None (Preliminary result)   Collection Time: 11/05/23 12:45 PM   Specimen: Left Antecubital; Blood  Result Value Ref Range Status   Specimen Description LEFT ANTECUBITAL  Final   Special Requests   Final    BOTTLES DRAWN AEROBIC AND ANAEROBIC Blood Culture adequate volume   Culture   Final    NO GROWTH < 24 HOURS Performed at [facility], [address], [place], [place] [address]    Report Status PENDING  Incomplete  Blood Culture (routine x 2)     Status: None (Preliminary result)   Collection Time: 11/05/23 12:53 PM   Specimen: BLOOD  Result Value Ref Range Status   Specimen Description BLOOD BLOOD RIGHT HAND  Final   Special Requests   Final    BOTTLES DRAWN  AEROBIC AND ANAEROBIC Blood Culture adequate volume   Culture   Final    NO GROWTH < 24 HOURS Performed at [facility], [address], [place], [place] [address]    Report Status PENDING  Incomplete  MRSA Next Gen by PCR, Nasal     Status: None   Collection Time: 11/05/23  3:15 PM   Specimen: Nasal Mucosa; Nasal Swab  Result Value Ref Range Status   MRSA by PCR Next Gen NOT DETECTED NOT DETECTED Final    Comment: (NOTE) The GeneXpert MRSA Assay (FDA approved for NASAL specimens only), is one component of a comprehensive MRSA colonization surveillance program. It is not intended to diagnose MRSA infection nor to guide or monitor treatment for MRSA infections. Test performance is not FDA approved in patients less than 44 years old. Performed at [facility], [address], [place], [place] [address]      Time coordinating discharge: 35 minutes  SIGNED:   Erick Blinks, DO [facility] 11/06/2023, 10:49 AM  If 7PM-7AM, please contact night-coverage [email or web address]

## 2023-11-07 LAB — GLUCOSE, CAPILLARY: Glucose-Capillary: <10 mg/dL — CL (ref 70–99)

## 2023-11-10 LAB — CULTURE, BLOOD (ROUTINE X 2)
Culture: NO GROWTH
Culture: NO GROWTH
Special Requests: ADEQUATE
Special Requests: ADEQUATE

## 2023-11-28 ENCOUNTER — Encounter: Payer: Self-pay | Admitting: Internal Medicine

## 2023-11-28 ENCOUNTER — Ambulatory Visit: Payer: BC Managed Care – PPO | Admitting: Internal Medicine

## 2023-11-28 VITALS — BP 127/79 | HR 111 | Ht 67.0 in | Wt 114.8 lb

## 2023-11-28 DIAGNOSIS — R739 Hyperglycemia, unspecified: Secondary | ICD-10-CM

## 2023-11-28 DIAGNOSIS — Z0001 Encounter for general adult medical examination with abnormal findings: Secondary | ICD-10-CM

## 2023-11-28 DIAGNOSIS — E782 Mixed hyperlipidemia: Secondary | ICD-10-CM

## 2023-11-28 DIAGNOSIS — E876 Hypokalemia: Secondary | ICD-10-CM

## 2023-11-28 DIAGNOSIS — E441 Mild protein-calorie malnutrition: Secondary | ICD-10-CM

## 2023-11-28 DIAGNOSIS — F1029 Alcohol dependence with unspecified alcohol-induced disorder: Secondary | ICD-10-CM | POA: Diagnosis not present

## 2023-11-28 DIAGNOSIS — D538 Other specified nutritional anemias: Secondary | ICD-10-CM

## 2023-11-28 DIAGNOSIS — Z720 Tobacco use: Secondary | ICD-10-CM

## 2023-11-28 DIAGNOSIS — E559 Vitamin D deficiency, unspecified: Secondary | ICD-10-CM

## 2023-11-28 DIAGNOSIS — J449 Chronic obstructive pulmonary disease, unspecified: Secondary | ICD-10-CM

## 2023-11-28 DIAGNOSIS — Z125 Encounter for screening for malignant neoplasm of prostate: Secondary | ICD-10-CM

## 2023-11-28 DIAGNOSIS — J0111 Acute recurrent frontal sinusitis: Secondary | ICD-10-CM | POA: Insufficient documentation

## 2023-11-28 DIAGNOSIS — I10 Essential (primary) hypertension: Secondary | ICD-10-CM

## 2023-11-28 MED ORDER — AMOXICILLIN-POT CLAVULANATE 875-125 MG PO TABS: 1.0000 | ORAL_TABLET | Freq: Two times a day (BID) | ORAL | 0 refills | Status: DC

## 2023-11-28 MED ORDER — AMLODIPINE BESYLATE 5 MG PO TABS: 5.0000 mg | ORAL_TABLET | Freq: Every day | ORAL | 1 refills | Status: DC

## 2023-11-28 MED ORDER — FOLIC ACID 1 MG PO TABS: 1.0000 mg | ORAL_TABLET | Freq: Every day | ORAL | 3 refills | Status: DC

## 2023-11-28 NOTE — Assessment & Plan Note (Addendum)
BP Readings from Last 1 Encounters:  11/28/23 127/79   Well-controlled with amlodipine 5 mg QD Counseled for compliance with the medications Advised DASH diet and moderate exercise/walking, at least 150 mins/week

## 2023-11-28 NOTE — Assessment & Plan Note (Signed)
Smokes about 0.5 pack/day  Asked about quitting: confirms that he/she currently smokes cigarettes Advise to quit smoking: Educated about QUITTING to reduce the risk of cancer, cardio and cerebrovascular disease. Assess willingness: Unwilling to quit at this time, but is working on cutting back. Assist with counseling and pharmacotherapy: Counseled for 5 minutes and literature provided. Arrange for follow up: follow up and continue to offer help. 

## 2023-11-28 NOTE — Assessment & Plan Note (Signed)
Takes about 3-4 alcoholic drinks in a day, was admitted recently for acute metabolic  encephalopathy due to alcohol intoxication Needs to cut down, but he is not willing despite counseling AA support groups information provided

## 2023-11-28 NOTE — Assessment & Plan Note (Signed)
Usually well-controlled Albuterol PRN for dyspnea or wheezing Needs to cut down alcohol use and quit smoking

## 2023-11-28 NOTE — Assessment & Plan Note (Addendum)
Likely due to chronic alcohol use On Klor-Con 20 mEq QD Check CMP

## 2023-11-28 NOTE — Assessment & Plan Note (Signed)
BMI Readings from Last 3 Encounters:  11/28/23 17.98 kg/m  11/06/23 18.09 kg/m  09/11/23 17.64 kg/m   Likely due to alcohol abuse, needs to cut down Advised to eat at regular intervals Advised to take protein supplements

## 2023-11-28 NOTE — Assessment & Plan Note (Signed)
Started empiric Augmentin as he has persistent symptoms despite symptomatic treatment Continue Flonase for nasal congestion Nasal bleeding likely due to dry air exposure, advised to use humidifier and/or vaporizer

## 2023-11-28 NOTE — Assessment & Plan Note (Signed)
Macrocytic anemia, likely due to chronic alcohol use No signs of active bleeding Needs to continue folic acid supplement

## 2023-11-28 NOTE — Patient Instructions (Signed)
Please continue to take medications as prescribed.  Please continue to follow low salt diet and perform moderate exercise/walking at least 150 mins/week.  Please cut down -> quit alcohol and smoking.

## 2023-11-28 NOTE — Assessment & Plan Note (Signed)
Ordered PSA after discussing its limitations for prostate cancer screening, including false positive results leading to additional investigations. 

## 2023-11-28 NOTE — Assessment & Plan Note (Addendum)
Physical exam as documented. Counseling done  re healthy lifestyle involving commitment to 150 minutes exercise per week, heart healthy diet, and attaining healthy weight.The importance of adequate sleep also discussed. Immunization and cancer screening needs are specifically addressed at this visit.  Unable to provide vaccines today due to acute sinusitis. Advised to get it from clinic after 1 week.

## 2023-11-28 NOTE — Progress Notes (Signed)
Established Patient Office Visit  Subjective:  Patient ID: Donald Stewart, male    DOB: [birth year]  Age: 53 y.o. MRN: [id number]  CC:  Chief Complaint  Patient presents with   Sinus Problem    Pt reports sinus infection sx of congestion and cough. Also been having nose bleeds.   Hypertension    Follow up   Annual Exam    HPI Donald Stewart is a 53 y.o. male with past medical history of HTN, HLD, alcohol and tobacco abuse who presents for annual physical. His mother is present during the visit.  HTN: BP is well-controlled. Takes medications regularly. Patient denies headache, dizziness, chest pain, dyspnea or palpitations.  He has been smoking about 1-2 cigarettes/day now. He takes about 2-3 alcoholic drinks (vodka) and a bear in a day, which is better than 6-8 drinks in a day in the past. He is not willing to decrease alcohol and smoking despite counseling. He denies to join any support group. His weight is stable since the last visit.  He denies any fever, chills, decreased appetite, nausea, vomiting, hemoptysis or chronic cough, night sweats, LAD, diarrhea.  He has started taking thiamine, folic acid and potassium supplement now, but compliance is questionable.  He reports nasal congestion, postnasal drip and dry cough for the last 2 weeks.  He denies any fever or chills.  Denies any dyspnea or wheezing.  He has tried OTC antihistaminic without much relief.    Past Medical History:  Diagnosis Date   Asthma    Avascular necrosis of hip, right (HCC) 06/08/2021   Hypercholesterolemia    Hypertension     Past Surgical History:  Procedure Laterality Date   TOTAL HIP ARTHROPLASTY Right     Family History  Problem Relation Age of Onset   Hypertension Mother     Social History   Socioeconomic History   Marital status: Divorced    Spouse name: Not on file   Number of children: Not on file   Years of education: Not on file   Highest education level: Not on file   Occupational History   Not on file  Tobacco Use   Smoking status: Every Day    Current packs/day: 0.50    Average packs/day: 0.5 packs/day for 20.0 years (10.0 ttl pk-yrs)    Types: Cigarettes   Smokeless tobacco: Never   Tobacco comments:    1 1/2 cig a day  Vaping Use   Vaping status: Never Used  Substance and Sexual Activity   Alcohol use: Yes    Comment: drinks until he passes out daily. has no desire to stop   Drug use: No   Sexual activity: Not on file  Other Topics Concern   Not on file  Social History Narrative   Not on file   Social Drivers of Health   Financial Resource Strain: Not on file  Food Insecurity: No Food Insecurity (11/05/2023)   Hunger Vital Sign    Worried About Running Out of Food in the Last Year: Never true    Ran Out of Food in the Last Year: Never true  Transportation Needs: No Transportation Needs (11/05/2023)   PRAPARE - Administrator, Civil Service (Medical): No    Lack of Transportation (Non-Medical): No  Physical Activity: Not on file  Stress: Not on file  Social Connections: Not on file  Intimate Partner Violence: Not At Risk (11/05/2023)   Humiliation, Afraid, Rape, and Kick questionnaire  Fear of Current or Ex-Partner: No    Emotionally Abused: No    Physically Abused: No    Sexually Abused: No    Outpatient Medications Prior to Visit  Medication Sig Dispense Refill   albuterol (VENTOLIN HFA) 108 (90 Base) MCG/ACT inhaler Inhale 2 puffs into the lungs every 6 (six) hours as needed for wheezing or shortness of breath. 8 g 0   fluticasone (FLONASE) 50 MCG/ACT nasal spray Place 2 sprays into both nostrils daily. 16 g 6   ibuprofen (ADVIL) 800 MG tablet Take 800 mg by mouth 3 (three) times daily.     potassium chloride SA (KLOR-CON M) 20 MEQ tablet TAKE 1 TABLET(20 MEQ) BY MOUTH DAILY 90 tablet 1   amLODipine (NORVASC) 5 MG tablet Take 1 tablet (5 mg total) by mouth daily. 90 tablet 1   chlordiazePOXIDE (LIBRIUM) 10 MG  capsule Take 1 capsule (10 mg total) by mouth 3 (three) times daily as needed for anxiety or withdrawal. 20 capsule 0   folic acid (FOLVITE) 1 MG tablet Take 1 tablet (1 mg total) by mouth daily. 90 tablet 3   No facility-administered medications prior to visit.    No Known Allergies  ROS Review of Systems  Constitutional:  Positive for unexpected weight change. Negative for chills and fever.  HENT:  Positive for congestion, postnasal drip and sore throat.   Eyes:  Negative for pain and discharge.  Respiratory:  Positive for cough. Negative for shortness of breath.   Cardiovascular:  Negative for chest pain and palpitations.  Gastrointestinal:  Negative for constipation, diarrhea, nausea and vomiting.  Endocrine: Negative for polydipsia and polyuria.  Genitourinary:  Negative for dysuria and hematuria.  Musculoskeletal:  Negative for neck pain and neck stiffness.  Skin:  Negative for rash.  Neurological:  Negative for dizziness, weakness, numbness and headaches.  Psychiatric/Behavioral:  Negative for agitation and behavioral problems.       Objective:    Physical Exam Vitals reviewed.  Constitutional:      General: He is not in acute distress.    Appearance: He is cachectic. He is not diaphoretic.  HENT:     Head: Normocephalic and atraumatic.     Nose: Congestion present.     Right Sinus: Frontal sinus tenderness present.     Left Sinus: Frontal sinus tenderness present.     Mouth/Throat:     Mouth: Mucous membranes are moist.     Pharynx: Posterior oropharyngeal erythema present.  Eyes:     General: No scleral icterus.    Extraocular Movements: Extraocular movements intact.  Cardiovascular:     Rate and Rhythm: Normal rate and regular rhythm.     Pulses: Normal pulses.     Heart sounds: Normal heart sounds. No murmur heard. Pulmonary:     Breath sounds: Normal breath sounds. No wheezing or rales.  Abdominal:     Palpations: Abdomen is soft.     Tenderness: There  is no abdominal tenderness.  Musculoskeletal:     Cervical back: Neck supple. No tenderness.     Right lower leg: No edema.     Left lower leg: No edema.  Skin:    General: Skin is warm.     Findings: No rash.  Neurological:     General: No focal deficit present.     Mental Status: He is alert and oriented to person, place, and time.     Cranial Nerves: No cranial nerve deficit.     Sensory: No sensory  deficit.     Motor: No weakness.  Psychiatric:        Mood and Affect: Mood normal.        Behavior: Behavior normal.     BP 127/79   Pulse (!) 111   Ht 5\' 7"  (1.702 m)   Wt 114 lb 12.8 oz (52.1 kg)   SpO2 94%   BMI 17.98 kg/m  Wt Readings from Last 3 Encounters:  11/28/23 114 lb 12.8 oz (52.1 kg)  11/06/23 115 lb 8.3 oz (52.4 kg)  09/11/23 112 lb 9.6 oz (51.1 kg)    Lab Results  Component Value Date   TSH 1.260 11/14/2022   Lab Results  Component Value Date   WBC 4.7 11/06/2023   HGB 10.7 (L) 11/06/2023   HCT 30.7 (L) 11/06/2023   MCV 105.1 (H) 11/06/2023   PLT 128 (L) 11/06/2023   Lab Results  Component Value Date   NA 136 11/06/2023   K 3.3 (L) 11/06/2023   CO2 23 11/06/2023   GLUCOSE 97 11/06/2023   BUN <5 (L) 11/06/2023   CREATININE 0.61 11/06/2023   BILITOT 1.1 11/05/2023   ALKPHOS 85 11/05/2023   AST 110 (H) 11/05/2023   ALT 24 11/05/2023   PROT 7.4 11/05/2023   ALBUMIN 4.2 11/05/2023   CALCIUM 8.0 (L) 11/06/2023   ANIONGAP 12 11/06/2023   EGFR 115 03/27/2023   Lab Results  Component Value Date   CHOL 174 11/14/2022   Lab Results  Component Value Date   HDL 85 11/14/2022   Lab Results  Component Value Date   LDLCALC 61 11/14/2022   Lab Results  Component Value Date   TRIG 171 (H) 11/14/2022   Lab Results  Component Value Date   CHOLHDL 2.0 11/14/2022   Lab Results  Component Value Date   HGBA1C 5.2 06/08/2021      Assessment & Plan:   Problem List Items Addressed This Visit       Cardiovascular and Mediastinum    Primary hypertension   BP Readings from Last 1 Encounters:  11/28/23 127/79   Well-controlled with amlodipine 5 mg QD Counseled for compliance with the medications Advised DASH diet and moderate exercise/walking, at least 150 mins/week      Relevant Medications   amLODipine (NORVASC) 5 MG tablet   Other Relevant Orders   TSH   CMP14+EGFR   CBC with Differential/Platelet     Respiratory   Chronic obstructive pulmonary disease (HCC)   Usually well-controlled Albuterol PRN for dyspnea or wheezing Needs to cut down alcohol use and quit smoking      Acute recurrent frontal sinusitis   Started empiric Augmentin as he has persistent symptoms despite symptomatic treatment Continue Flonase for nasal congestion Nasal bleeding likely due to dry air exposure, advised to use humidifier and/or vaporizer      Relevant Medications   amoxicillin-clavulanate (AUGMENTIN) 875-125 MG tablet     Other   Tobacco abuse   Smokes about 0.5 pack/day  Asked about quitting: confirms that he/she currently smokes cigarettes Advise to quit smoking: Educated about QUITTING to reduce the risk of cancer, cardio and cerebrovascular disease. Assess willingness: Unwilling to quit at this time, but is working on cutting back. Assist with counseling and pharmacotherapy: Counseled for 5 minutes and literature provided. Arrange for follow up: follow up and continue to offer help.      Mixed hyperlipidemia   Relevant Medications   amLODipine (NORVASC) 5 MG tablet   Other Relevant Orders  Lipid panel   Encounter for general adult medical examination with abnormal findings - Primary   Physical exam as documented. Counseling done  re healthy lifestyle involving commitment to 150 minutes exercise per week, heart healthy diet, and attaining healthy weight.The importance of adequate sleep also discussed. Immunization and cancer screening needs are specifically addressed at this visit.  Unable to provide  vaccines today due to acute sinusitis. Advised to get it from clinic after 1 week.      Prostate cancer screening   Ordered PSA after discussing its limitations for prostate cancer screening, including false positive results leading to additional investigations.      Relevant Orders   PSA   Protein-calorie malnutrition (HCC)   BMI Readings from Last 3 Encounters:  11/28/23 17.98 kg/m  11/06/23 18.09 kg/m  09/11/23 17.64 kg/m   Likely due to alcohol abuse, needs to cut down Advised to eat at regular intervals Advised to take protein supplements      Relevant Medications   folic acid (FOLVITE) 1 MG tablet   Alcohol dependence with unspecified alcohol-induced disorder (HCC)   Takes about 3-4 alcoholic drinks in a day, was admitted recently for acute metabolic  encephalopathy due to alcohol intoxication Needs to cut down, but he is not willing despite counseling AA support groups information provided      Relevant Medications   folic acid (FOLVITE) 1 MG tablet   Other Relevant Orders   CMP14+EGFR   Hypokalemia   Likely due to chronic alcohol use On Klor-Con 20 mEq QD Check CMP      Other specified nutritional anemias   Macrocytic anemia, likely due to chronic alcohol use No signs of active bleeding Needs to continue folic acid supplement      Relevant Medications   folic acid (FOLVITE) 1 MG tablet   Other Visit Diagnoses       Vitamin D deficiency       Relevant Orders   VITAMIN D 25 Hydroxy (Vit-D Deficiency, Fractures)     Hyperglycemia       Relevant Orders   Hemoglobin A1c        Meds ordered this encounter  Medications   amLODipine (NORVASC) 5 MG tablet    Sig: Take 1 tablet (5 mg total) by mouth daily.    Dispense:  90 tablet    Refill:  1   folic acid (FOLVITE) 1 MG tablet    Sig: Take 1 tablet (1 mg total) by mouth daily.    Dispense:  90 tablet    Refill:  3   amoxicillin-clavulanate (AUGMENTIN) 875-125 MG tablet    Sig: Take 1 tablet by  mouth 2 (two) times daily.    Dispense:  14 tablet    Refill:  0    Follow-up: Return in about 4 months (around 03/27/2024) for HTN.    Anabel Halon, MD

## 2023-11-29 LAB — HEMOGLOBIN A1C
Est. average glucose Bld gHb Est-mCnc: 97 mg/dL
Hgb A1c MFr Bld: 5 % (ref 4.8–5.6)

## 2023-11-29 LAB — LIPID PANEL
Chol/HDL Ratio: 1.9 {ratio} (ref 0.0–5.0)
Cholesterol, Total: 167 mg/dL (ref 100–199)
HDL: 88 mg/dL (ref >39)
LDL Chol Calc (NIH): 53 mg/dL (ref 0–99)
Triglycerides: 161 mg/dL — ABNORMAL HIGH (ref 0–149)
VLDL Cholesterol Cal: 26 mg/dL (ref 5–40)

## 2023-11-29 LAB — CMP14+EGFR
ALT: 22 [IU]/L (ref 0–44)
AST: 51 [IU]/L — ABNORMAL HIGH (ref 0–40)
Albumin: 4.3 g/dL (ref 3.8–4.9)
Alkaline Phosphatase: 130 [IU]/L — ABNORMAL HIGH (ref 44–121)
BUN/Creatinine Ratio: 9 (ref 9–20)
BUN: 7 mg/dL (ref 6–24)
Bilirubin Total: 0.7 mg/dL (ref 0.0–1.2)
CO2: 25 mmol/L (ref 20–29)
Calcium: 9.4 mg/dL (ref 8.7–10.2)
Chloride: 95 mmol/L — ABNORMAL LOW (ref 96–106)
Creatinine, Ser: 0.77 mg/dL (ref 0.76–1.27)
Globulin, Total: 2.8 g/dL (ref 1.5–4.5)
Glucose: 93 mg/dL (ref 70–99)
Potassium: 3.7 mmol/L (ref 3.5–5.2)
Sodium: 138 mmol/L (ref 134–144)
Total Protein: 7.1 g/dL (ref 6.0–8.5)
eGFR: 107 mL/min/{1.73_m2} (ref >59)

## 2023-11-29 LAB — CBC WITH DIFFERENTIAL/PLATELET
Basophils Absolute: 0 10*3/uL (ref 0.0–0.2)
Basos: 0 %
EOS (ABSOLUTE): 0 10*3/uL (ref 0.0–0.4)
Eos: 0 %
Hematocrit: 31 % — ABNORMAL LOW (ref 37.5–51.0)
Hemoglobin: 10.6 g/dL — ABNORMAL LOW (ref 13.0–17.7)
Immature Grans (Abs): 0 10*3/uL (ref 0.0–0.1)
Immature Granulocytes: 0 %
Lymphocytes Absolute: 1.6 10*3/uL (ref 0.7–3.1)
Lymphs: 23 %
MCH: 35.7 pg — ABNORMAL HIGH (ref 26.6–33.0)
MCHC: 34.2 g/dL (ref 31.5–35.7)
MCV: 104 fL — ABNORMAL HIGH (ref 79–97)
Monocytes Absolute: 0.8 10*3/uL (ref 0.1–0.9)
Monocytes: 12 %
Neutrophils Absolute: 4.6 10*3/uL (ref 1.4–7.0)
Neutrophils: 65 %
Platelets: 170 10*3/uL (ref 150–450)
RBC: 2.97 x10E6/uL — ABNORMAL LOW (ref 4.14–5.80)
RDW: 12.2 % (ref 11.6–15.4)
WBC: 7.1 10*3/uL (ref 3.4–10.8)

## 2023-11-29 LAB — VITAMIN D 25 HYDROXY (VIT D DEFICIENCY, FRACTURES): Vit D, 25-Hydroxy: 12.1 ng/mL — ABNORMAL LOW (ref 30.0–100.0)

## 2023-11-29 LAB — TSH: TSH: 1.5 u[IU]/mL (ref 0.450–4.500)

## 2023-11-29 LAB — PSA: Prostate Specific Ag, Serum: 0.4 ng/mL (ref 0.0–4.0)

## 2024-02-22 ENCOUNTER — Other Ambulatory Visit: Payer: Self-pay | Admitting: Internal Medicine

## 2024-02-22 DIAGNOSIS — E876 Hypokalemia: Secondary | ICD-10-CM

## 2024-03-27 ENCOUNTER — Encounter: Payer: Self-pay | Admitting: Internal Medicine

## 2024-03-27 ENCOUNTER — Ambulatory Visit: Payer: BC Managed Care – PPO | Admitting: Internal Medicine

## 2024-03-27 VITALS — BP 127/86 | HR 83 | Ht 67.0 in | Wt 113.4 lb

## 2024-03-27 DIAGNOSIS — I10 Essential (primary) hypertension: Secondary | ICD-10-CM | POA: Diagnosis not present

## 2024-03-27 DIAGNOSIS — J449 Chronic obstructive pulmonary disease, unspecified: Secondary | ICD-10-CM | POA: Diagnosis not present

## 2024-03-27 DIAGNOSIS — K709 Alcoholic liver disease, unspecified: Secondary | ICD-10-CM | POA: Diagnosis not present

## 2024-03-27 DIAGNOSIS — Z23 Encounter for immunization: Secondary | ICD-10-CM

## 2024-03-27 DIAGNOSIS — E440 Moderate protein-calorie malnutrition: Secondary | ICD-10-CM

## 2024-03-27 DIAGNOSIS — W19XXXA Unspecified fall, initial encounter: Secondary | ICD-10-CM

## 2024-03-27 DIAGNOSIS — D539 Nutritional anemia, unspecified: Secondary | ICD-10-CM

## 2024-03-27 MED ORDER — AMLODIPINE BESYLATE 5 MG PO TABS: 5.0000 mg | ORAL_TABLET | Freq: Every day | ORAL | 1 refills | Status: DC

## 2024-03-27 NOTE — Assessment & Plan Note (Signed)
 BP Readings from Last 1 Encounters:  03/27/24 127/86   Well-controlled with amlodipine  5 mg QD Counseled for compliance with the medications Advised DASH diet and moderate exercise/walking, at least 150 mins/week

## 2024-03-27 NOTE — Assessment & Plan Note (Addendum)
 Elevated liver enzymes in CMP, likely due to alcoholic liver disease in addition to dehydration Needs to cut down/avoid alcohol use - provided information about [facility] Continue thiamine, folic acid  and B12 supplement Maintain adequate hydration Checked US  RUQ

## 2024-03-27 NOTE — Assessment & Plan Note (Addendum)
 BMI Readings from Last 3 Encounters:  03/27/24 17.76 kg/m  11/28/23 17.98 kg/m  11/06/23 18.09 kg/m   Has muscle wasting Likely due to alcohol abuse, needs to cut down Advised to eat at regular intervals Advised to take protein supplements

## 2024-03-27 NOTE — Patient Instructions (Signed)
 Please continue taking Thiamin, folic acid  and B12 supplements regularly.  Please continue to take medications as prescribed.  Please continue to follow low salt diet and perform moderate exercise/walking at least 150 mins/week.

## 2024-03-27 NOTE — Assessment & Plan Note (Signed)
 Had fall likely due to feeling drowsy, could be from dehydration/hyponatremia from alcohol abuse Has left hand swelling, but denies any pain - if persistent swelling or new onset pain, will get x-ray of left hand Advised to apply cool compresses for ice packs for swelling

## 2024-03-27 NOTE — Progress Notes (Addendum)
 Established Patient Office Visit  Subjective:  Patient ID: Donald Stewart, male    DOB: [birth year]  Age: 53 y.o. MRN: [id number]  CC:  Chief Complaint  Patient presents with   Medical Management of Chronic Issues    4 month f/u    HPI Donald Stewart is a 53 y.o. male with past medical history of HTN, HLD, alcohol and tobacco abuse who presents for annual physical. His mother is present during the visit.  HTN: BP is well-controlled. Takes medications regularly. Patient denies headache, dizziness, chest pain, dyspnea or palpitations.  He has been smoking about 0.5 pack/day now. He takes about 2-3 alcoholic drinks (vodka) and a bear in a day, which is better than 6-8 drinks in a day in the past. He is not willing to decrease alcohol and smoking despite counseling. He denies to join any support group. His weight is stable since the last visit.  He denies any fever, chills, decreased appetite, nausea, vomiting, hemoptysis or chronic cough, night sweats, LAD, diarrhea.  He has started taking thiamine, folic acid  and potassium supplement now, but compliance is questionable.  He had a fall from porch, and fell on his left hand on 03/21/24.  He admits that he had 4 drinks on that day.  He has left hand swelling, especially near index finger.  He denies any pain currently.  He has tried applying ice on some days.  He has small abrasion over index finger, but no active bleeding or discharge.  Denies any numbness or tingling of the hand.  Denies any head injury.  Denies LOC.    Past Medical History:  Diagnosis Date   Asthma    Avascular necrosis of hip, right (HCC) 06/08/2021   Hypercholesterolemia    Hypertension     Past Surgical History:  Procedure Laterality Date   TOTAL HIP ARTHROPLASTY Right     Family History  Problem Relation Age of Onset   Hypertension Mother     Social History   Socioeconomic History   Marital status: Divorced    Spouse name: Not on file    Number of children: Not on file   Years of education: Not on file   Highest education level: Not on file  Occupational History   Not on file  Tobacco Use   Smoking status: Every Day    Current packs/day: 0.50    Average packs/day: 0.5 packs/day for 20.0 years (10.0 ttl pk-yrs)    Types: Cigarettes   Smokeless tobacco: Never   Tobacco comments:    1 1/2 cig a day  Vaping Use   Vaping status: Never Used  Substance and Sexual Activity   Alcohol use: Yes    Comment: drinks until he passes out daily. has no desire to stop   Drug use: No   Sexual activity: Not on file  Other Topics Concern   Not on file  Social History Narrative   Not on file   Social Drivers of Health   Financial Resource Strain: Not on file  Food Insecurity: No Food Insecurity (11/05/2023)   Hunger Vital Sign    Worried About Running Out of Food in the Last Year: Never true    Ran Out of Food in the Last Year: Never true  Transportation Needs: No Transportation Needs (11/05/2023)   PRAPARE - Administrator, Civil Service (Medical): No    Lack of Transportation (Non-Medical): No  Physical Activity: Not on file  Stress: Not on file  Social Connections: Not on file  Intimate Partner Violence: Not At Risk (11/05/2023)   Humiliation, Afraid, Rape, and Kick questionnaire    Fear of Current or Ex-Partner: No    Emotionally Abused: No    Physically Abused: No    Sexually Abused: No    Outpatient Medications Prior to Visit  Medication Sig Dispense Refill   albuterol  (VENTOLIN  HFA) 108 (90 Base) MCG/ACT inhaler Inhale 2 puffs into the lungs every 6 (six) hours as needed for wheezing or shortness of breath. 8 g 0   fluticasone  (FLONASE ) 50 MCG/ACT nasal spray Place 2 sprays into both nostrils daily. 16 g 6   folic acid  (FOLVITE ) 1 MG tablet Take 1 tablet (1 mg total) by mouth daily. 90 tablet 3   ibuprofen  (ADVIL ) 800 MG tablet Take 800 mg by mouth 3 (three) times daily.     potassium chloride  SA  (KLOR-CON  M) 20 MEQ tablet TAKE 1 TABLET(20 MEQ) BY MOUTH DAILY 90 tablet 1   amLODipine  (NORVASC ) 5 MG tablet Take 1 tablet (5 mg total) by mouth daily. 90 tablet 1   amoxicillin -clavulanate (AUGMENTIN ) 875-125 MG tablet Take 1 tablet by mouth 2 (two) times daily. 14 tablet 0   No facility-administered medications prior to visit.    No Known Allergies  ROS Review of Systems  Constitutional:  Negative for chills and fever.  HENT:  Negative for congestion and postnasal drip.   Eyes:  Negative for pain and discharge.  Respiratory:  Negative for cough and shortness of breath.   Cardiovascular:  Negative for chest pain and palpitations.  Gastrointestinal:  Negative for diarrhea, nausea and vomiting.  Endocrine: Negative for polydipsia and polyuria.  Genitourinary:  Negative for dysuria and hematuria.  Musculoskeletal:  Negative for neck pain and neck stiffness.  Skin:  Negative for rash.  Neurological:  Negative for dizziness, weakness, numbness and headaches.  Psychiatric/Behavioral:  Negative for agitation and behavioral problems.       Objective:    Physical Exam Vitals reviewed.  Constitutional:      General: He is not in acute distress.    Appearance: He is cachectic. He is not diaphoretic.  HENT:     Head: Normocephalic and atraumatic.     Nose: No congestion.     Mouth/Throat:     Mouth: Mucous membranes are moist.     Pharynx: Posterior oropharyngeal erythema present.  Eyes:     General: No scleral icterus.    Extraocular Movements: Extraocular movements intact.  Cardiovascular:     Rate and Rhythm: Normal rate and regular rhythm.     Heart sounds: Normal heart sounds. No murmur heard. Pulmonary:     Breath sounds: Normal breath sounds. No wheezing or rales.  Musculoskeletal:     Left hand: Swelling present. No tenderness.     Cervical back: Neck supple. No tenderness.     Right lower leg: No edema.     Left lower leg: No edema.  Skin:    General: Skin is  warm.     Findings: No rash.  Neurological:     General: No focal deficit present.     Mental Status: He is alert and oriented to person, place, and time.     Sensory: No sensory deficit.     Motor: No weakness.  Psychiatric:        Mood and Affect: Mood normal.        Behavior: Behavior normal.     BP 127/86   Pulse  83   Ht 5\' 7"  (1.702 m)   Wt 113 lb 6.4 oz (51.4 kg)   SpO2 97%   BMI 17.76 kg/m  Wt Readings from Last 3 Encounters:  03/27/24 113 lb 6.4 oz (51.4 kg)  11/28/23 114 lb 12.8 oz (52.1 kg)  11/06/23 115 lb 8.3 oz (52.4 kg)    Lab Results  Component Value Date   TSH 1.500 11/28/2023   Lab Results  Component Value Date   WBC 7.1 11/28/2023   HGB 10.6 (L) 11/28/2023   HCT 31.0 (L) 11/28/2023   MCV 104 (H) 11/28/2023   PLT 170 11/28/2023   Lab Results  Component Value Date   NA 138 11/28/2023   K 3.7 11/28/2023   CO2 25 11/28/2023   GLUCOSE 93 11/28/2023   BUN 7 11/28/2023   CREATININE 0.77 11/28/2023   BILITOT 0.7 11/28/2023   ALKPHOS 130 (H) 11/28/2023   AST 51 (H) 11/28/2023   ALT 22 11/28/2023   PROT 7.1 11/28/2023   ALBUMIN 4.3 11/28/2023   CALCIUM 9.4 11/28/2023   ANIONGAP 12 11/06/2023   EGFR 107 11/28/2023   Lab Results  Component Value Date   CHOL 167 11/28/2023   Lab Results  Component Value Date   HDL 88 11/28/2023   Lab Results  Component Value Date   LDLCALC 53 11/28/2023   Lab Results  Component Value Date   TRIG 161 (H) 11/28/2023   Lab Results  Component Value Date   CHOLHDL 1.9 11/28/2023   Lab Results  Component Value Date   HGBA1C 5.0 11/28/2023      Assessment & Plan:   Problem List Items Addressed This Visit       Cardiovascular and Mediastinum   Primary hypertension - Primary   BP Readings from Last 1 Encounters:  03/27/24 127/86   Well-controlled with amlodipine  5 mg QD Counseled for compliance with the medications Advised DASH diet and moderate exercise/walking, at least 150 mins/week       Relevant Medications   amLODipine  (NORVASC ) 5 MG tablet     Respiratory   Chronic obstructive pulmonary disease (HCC)   Usually well-controlled Albuterol  PRN for dyspnea or wheezing Needs to cut down alcohol use and quit smoking        Digestive   Alcoholic liver disease (HCC)   Elevated liver enzymes in CMP, likely due to alcoholic liver disease in addition to dehydration Needs to cut down/avoid alcohol use - provided information about [facility] Continue thiamine, folic acid  and B12 supplement Maintain adequate hydration Checked US  RUQ        Other   Protein-calorie malnutrition (HCC)   BMI Readings from Last 3 Encounters:  03/27/24 17.76 kg/m  11/28/23 17.98 kg/m  11/06/23 18.09 kg/m   Has muscle wasting Likely due to alcohol abuse, needs to cut down Advised to eat at regular intervals Advised to take protein supplements      Fall   Had fall likely due to feeling drowsy, could be from dehydration/hyponatremia from alcohol abuse Has left hand swelling, but denies any pain - if persistent swelling or new onset pain, will get x-ray of left hand Advised to apply cool compresses for ice packs for swelling      Macrocytic anemia   Likely due to alcohol abuse Continue folic acid  and B12 supplement No signs of active bleeding      Other Visit Diagnoses       Encounter for immunization  Relevant Orders   Pneumococcal conjugate vaccine 20-valent (Completed)         Meds ordered this encounter  Medications   amLODipine  (NORVASC ) 5 MG tablet    Sig: Take 1 tablet (5 mg total) by mouth daily.    Dispense:  90 tablet    Refill:  1    Follow-up: Return in about 4 months (around 07/28/2024) for HTN and weight check.    Meldon Sport, MD

## 2024-03-27 NOTE — Assessment & Plan Note (Signed)
 Usually well-controlled Albuterol PRN for dyspnea or wheezing Needs to cut down alcohol use and quit smoking

## 2024-03-27 NOTE — Assessment & Plan Note (Signed)
 Likely due to alcohol abuse Continue folic acid and B12 supplement No signs of active bleeding

## 2024-04-03 ENCOUNTER — Other Ambulatory Visit: Payer: Self-pay | Admitting: Internal Medicine

## 2024-04-03 DIAGNOSIS — E559 Vitamin D deficiency, unspecified: Secondary | ICD-10-CM

## 2024-07-30 ENCOUNTER — Other Ambulatory Visit: Payer: Self-pay | Admitting: Internal Medicine

## 2024-07-30 ENCOUNTER — Encounter: Payer: Self-pay | Admitting: Internal Medicine

## 2024-07-30 ENCOUNTER — Ambulatory Visit (INDEPENDENT_AMBULATORY_CARE_PROVIDER_SITE_OTHER): Admitting: Internal Medicine

## 2024-07-30 VITALS — BP 130/82 | HR 90 | Ht 67.0 in | Wt 115.0 lb

## 2024-07-30 DIAGNOSIS — J309 Allergic rhinitis, unspecified: Secondary | ICD-10-CM

## 2024-07-30 DIAGNOSIS — E876 Hypokalemia: Secondary | ICD-10-CM | POA: Diagnosis not present

## 2024-07-30 DIAGNOSIS — I10 Essential (primary) hypertension: Secondary | ICD-10-CM

## 2024-07-30 DIAGNOSIS — E441 Mild protein-calorie malnutrition: Secondary | ICD-10-CM

## 2024-07-30 DIAGNOSIS — F1029 Alcohol dependence with unspecified alcohol-induced disorder: Secondary | ICD-10-CM | POA: Diagnosis not present

## 2024-07-30 MED ORDER — NOREL AD 4-10-325 MG PO TABS: 1.0000 | ORAL_TABLET | Freq: Three times a day (TID) | ORAL | 0 refills | Status: DC | PRN

## 2024-07-30 MED ORDER — POTASSIUM CHLORIDE CRYS ER 20 MEQ PO TBCR: 20.0000 meq | EXTENDED_RELEASE_TABLET | Freq: Every day | ORAL | 1 refills | Status: DC

## 2024-07-30 MED ORDER — AMLODIPINE BESYLATE 5 MG PO TABS: 5.0000 mg | ORAL_TABLET | Freq: Every day | ORAL | 1 refills | Status: DC

## 2024-07-30 MED ORDER — FLUTICASONE PROPIONATE 50 MCG/ACT NA SUSP: 2.0000 | Freq: Every day | NASAL | 1 refills | Status: DC

## 2024-07-30 MED ORDER — FOLIC ACID 1 MG PO TABS: 1.0000 mg | ORAL_TABLET | Freq: Every day | ORAL | 3 refills | Status: AC

## 2024-07-30 NOTE — Assessment & Plan Note (Signed)
 Takes about 3-4 alcoholic drinks in a day, was admitted in the past for acute metabolic  encephalopathy due to alcohol intoxication Needs to cut down, but he is not willing despite counseling AA support groups information provided

## 2024-07-30 NOTE — Progress Notes (Signed)
 Established Patient Office Visit  Subjective:  Patient ID: Donald Stewart, male    DOB: 1971-04-22  Age: 53 y.o. MRN: [id number]  CC:  Chief Complaint  Patient presents with   Nasal Congestion    Reports cough and nasal congestion ongoing since Sunday.    Hypertension    HPI Donald Stewart is a 53 y.o. male with past medical history of HTN, HLD, alcohol and tobacco abuse who presents for annual physical. His mother is present during the visit.  HTN: BP is well-controlled. Takes medications regularly. Patient denies headache, dizziness, chest pain, dyspnea or palpitations.  He has been smoking about 2 cigarettes/day now. He takes about 2-3 alcoholic drinks (vodka) and a bear in a day, which is better than 6-8 drinks in a day in the past. He is not willing to decrease alcohol and smoking despite counseling. He denies to join any support group. His weight is stable since the last visit.  He denies any fever, chills, decreased appetite, nausea, vomiting, hemoptysis or chronic cough, night sweats, LAD, diarrhea.  He has started taking thiamine, folic acid  and potassium supplement now, but compliance is questionable.  He reports nasal congestion, postnasal drip and dry cough for the last 4 days.  He denies any fever or chills.  He has a history of allergic sinusitis, but has not been using Flonase  or antihistaminic recently.  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  Denies any dyspnea or wheezing currently.    Past Medical History:  Diagnosis Date   Asthma    Avascular necrosis of hip, right (HCC) 06/08/2021   Hypercholesterolemia    Hypertension     Past Surgical History:  Procedure Laterality Date   TOTAL HIP ARTHROPLASTY Right     Family History  Problem Relation Age of Onset   Hypertension Mother     Social History   Socioeconomic History   Marital status: Divorced    Spouse name: Not on file    Number of children: Not on file   Years of education: Not on file   Highest education level: Not on file  Occupational History   Not on file  Tobacco Use   Smoking status: Every Day    Current packs/day: 0.50    Average packs/day: 0.5 packs/day for 20.0 years (10.0 ttl pk-yrs)    Types: Cigarettes   Smokeless tobacco: Never   Tobacco comments:    1 1/2 cig a day  Vaping Use   Vaping status: Never Used  Substance and Sexual Activity   Alcohol use: Yes    Comment: drinks until he passes out daily. has no desire to stop   Drug use: No   Sexual activity: Not on file  Other Topics Concern   Not on file  Social History Narrative   Not on file   Social Drivers of Health   Financial Resource Strain: Not on file  Food Insecurity: No Food Insecurity (11/05/2023)   Hunger Vital Sign    Worried About Running Out of Food in the Last Year: Never true    Ran Out of Food in the Last Year: Never true  Transportation Needs: No Transportation Needs (11/05/2023)   PRAPARE - Administrator, Civil Service (Medical): No    Lack of Transportation (Non-Medical): No  Physical Activity: Not on file  Stress: Not on file  Social Connections: Not on file  Intimate Partner Violence: Not  At Risk (11/05/2023)   Humiliation, Afraid, Rape, and Kick questionnaire    Fear of Current or Ex-Partner: No    Emotionally Abused: No    Physically Abused: No    Sexually Abused: No    Outpatient Medications Prior to Visit  Medication Sig Dispense Refill   albuterol  (VENTOLIN  HFA) 108 (90 Base) MCG/ACT inhaler Inhale 2 puffs into the lungs every 6 (six) hours as needed for wheezing or shortness of breath. 8 g 0   ibuprofen  (ADVIL ) 800 MG tablet Take 800 mg by mouth 3 (three) times daily.     amLODipine  (NORVASC ) 5 MG tablet Take 1 tablet (5 mg total) by mouth daily. 90 tablet 1   fluticasone  (FLONASE ) 50 MCG/ACT nasal spray Place 2 sprays into both nostrils daily. 16 g 6   folic acid  (FOLVITE ) 1  MG tablet Take 1 tablet (1 mg total) by mouth daily. 90 tablet 3   potassium chloride  SA (KLOR-CON  M) 20 MEQ tablet TAKE 1 TABLET(20 MEQ) BY MOUTH DAILY 90 tablet 1   No facility-administered medications prior to visit.    No Known Allergies  ROS Review of Systems  Constitutional:  Negative for chills and fever.  HENT:  Positive for congestion and postnasal drip.   Eyes:  Negative for pain and discharge.  Respiratory:  Positive for cough. Negative for shortness of breath.   Cardiovascular:  Negative for chest pain and palpitations.  Gastrointestinal:  Negative for diarrhea, nausea and vomiting.  Endocrine: Negative for polydipsia and polyuria.  Genitourinary:  Negative for dysuria and hematuria.  Musculoskeletal:  Negative for neck pain and neck stiffness.  Skin:  Negative for rash.  Neurological:  Negative for dizziness, weakness, numbness and headaches.  Psychiatric/Behavioral:  Negative for agitation and behavioral problems.       Objective:    Physical Exam Vitals reviewed.  Constitutional:      General: He is not in acute distress.    Appearance: He is cachectic. He is not diaphoretic.  HENT:     Head: Normocephalic and atraumatic.     Nose: Congestion present.     Mouth/Throat:     Mouth: Mucous membranes are moist.     Pharynx: Posterior oropharyngeal erythema present.  Eyes:     General: No scleral icterus.    Extraocular Movements: Extraocular movements intact.  Cardiovascular:     Rate and Rhythm: Normal rate and regular rhythm.     Heart sounds: Normal heart sounds. No murmur heard. Pulmonary:     Breath sounds: Normal breath sounds. No wheezing or rales.  Musculoskeletal:     Cervical back: Neck supple. No tenderness.     Right lower leg: No edema.     Left lower leg: No edema.  Skin:    General: Skin is warm.     Findings: No rash.  Neurological:     General: No focal deficit present.     Mental Status: He is alert and oriented to person, place, and  time.     Sensory: No sensory deficit.     Motor: No weakness.  Psychiatric:        Mood and Affect: Mood normal.        Behavior: Behavior normal.     BP 130/82   Pulse 90   Ht 5' 7 (1.702 m)   Wt 115 lb (52.2 kg)   SpO2 94%   BMI 18.01 kg/m  Wt Readings from Last 3 Encounters:  07/30/24 115 lb (52.2 kg)  03/27/24 113 lb 6.4 oz (51.4 kg)  11/28/23 114 lb 12.8 oz (52.1 kg)    Lab Results  Component Value Date   TSH 1.500 11/28/2023   Lab Results  Component Value Date   WBC 7.1 11/28/2023   HGB 10.6 (L) 11/28/2023   HCT 31.0 (L) 11/28/2023   MCV 104 (H) 11/28/2023   PLT 170 11/28/2023   Lab Results  Component Value Date   NA 138 11/28/2023   K 3.7 11/28/2023   CO2 25 11/28/2023   GLUCOSE 93 11/28/2023   BUN 7 11/28/2023   CREATININE 0.77 11/28/2023   BILITOT 0.7 11/28/2023   ALKPHOS 130 (H) 11/28/2023   AST 51 (H) 11/28/2023   ALT 22 11/28/2023   PROT 7.1 11/28/2023   ALBUMIN 4.3 11/28/2023   CALCIUM 9.4 11/28/2023   ANIONGAP 12 11/06/2023   EGFR 107 11/28/2023   Lab Results  Component Value Date   CHOL 167 11/28/2023   Lab Results  Component Value Date   HDL 88 11/28/2023   Lab Results  Component Value Date   LDLCALC 53 11/28/2023   Lab Results  Component Value Date   TRIG 161 (H) 11/28/2023   Lab Results  Component Value Date   CHOLHDL 1.9 11/28/2023   Lab Results  Component Value Date   HGBA1C 5.0 11/28/2023      Assessment & Plan:   Problem List Items Addressed This Visit       Cardiovascular and Mediastinum   Primary hypertension - Primary   BP Readings from Last 1 Encounters:  07/30/24 130/82   Well-controlled with amlodipine  5 mg QD Counseled for compliance with the medications Advised DASH diet and moderate exercise/walking, at least 150 mins/week      Relevant Medications   amLODipine  (NORVASC ) 5 MG tablet   Other Relevant Orders   CBC with Differential/Platelet   CMP14+EGFR     Respiratory   Allergic  sinusitis   Chronic nasal congestion and rhinorrhea likely due to allergic sinusitis Started Norel AD for nasal congestion Flonase  for allergies      Relevant Medications   Chlorphen-PE-Acetaminophen  (NOREL AD) 4-10-325 MG TABS   fluticasone  (FLONASE ) 50 MCG/ACT nasal spray     Other   Protein-calorie malnutrition (HCC)   BMI Readings from Last 3 Encounters:  07/30/24 18.01 kg/m  03/27/24 17.76 kg/m  11/28/23 17.98 kg/m   Has muscle wasting Likely due to alcohol abuse, needs to cut down Advised to eat at regular intervals Advised to take protein supplements      Relevant Medications   folic acid  (FOLVITE ) 1 MG tablet   Other Relevant Orders   CBC with Differential/Platelet   CMP14+EGFR   Alcohol dependence with unspecified alcohol-induced disorder (HCC)   Takes about 3-4 alcoholic drinks in a day, was admitted in the past for acute metabolic  encephalopathy due to alcohol intoxication Needs to cut down, but he is not willing despite counseling AA support groups information provided      Relevant Medications   folic acid  (FOLVITE ) 1 MG tablet   Hypokalemia   Likely due to chronic alcohol use On Klor-Con  20 mEq QD Check CMP      Relevant Medications   potassium chloride  SA (KLOR-CON  M) 20 MEQ tablet   Other Relevant Orders   CMP14+EGFR      Meds ordered this encounter  Medications   amLODipine  (NORVASC ) 5 MG tablet    Sig: Take 1 tablet (5 mg total) by mouth daily.  Dispense:  90 tablet    Refill:  1   folic acid  (FOLVITE ) 1 MG tablet    Sig: Take 1 tablet (1 mg total) by mouth daily.    Dispense:  90 tablet    Refill:  3   potassium chloride  SA (KLOR-CON  M) 20 MEQ tablet    Sig: Take 1 tablet (20 mEq total) by mouth daily.    Dispense:  90 tablet    Refill:  1   Chlorphen-PE-Acetaminophen  (NOREL AD) 4-10-325 MG TABS    Sig: Take 1 tablet by mouth 3 (three) times daily as needed.    Dispense:  30 tablet    Refill:  0   fluticasone  (FLONASE ) 50  MCG/ACT nasal spray    Sig: Place 2 sprays into both nostrils daily.    Dispense:  16 g    Refill:  1    Follow-up: Return in about 6 months (around 01/27/2025) for HTN.    Suzzane MARLA Blanch, MD

## 2024-07-30 NOTE — Assessment & Plan Note (Signed)
 Likely due to chronic alcohol use On Klor-Con 20 mEq QD Check CMP

## 2024-07-30 NOTE — Assessment & Plan Note (Signed)
 BMI Readings from Last 3 Encounters:  07/30/24 18.01 kg/m  03/27/24 17.76 kg/m  11/28/23 17.98 kg/m   Has muscle wasting Likely due to alcohol abuse, needs to cut down Advised to eat at regular intervals Advised to take protein supplements

## 2024-07-30 NOTE — Patient Instructions (Addendum)
 Please take Norel as needed for nasal congestion. Please use Flonase  for nasal congestion/allergies.  Please continue to take medications as prescribed.  Please continue to follow high protein diet and perform moderate exercise/walking at least 150 mins/week.

## 2024-07-30 NOTE — Assessment & Plan Note (Addendum)
 Chronic nasal congestion and rhinorrhea likely due to allergic sinusitis Started Norel AD for nasal congestion Flonase  for allergies

## 2024-07-30 NOTE — Assessment & Plan Note (Signed)
 BP Readings from Last 1 Encounters:  07/30/24 130/82   Well-controlled with amlodipine  5 mg QD Counseled for compliance with the medications Advised DASH diet and moderate exercise/walking, at least 150 mins/week

## 2024-07-31 ENCOUNTER — Ambulatory Visit: Payer: Self-pay | Admitting: Internal Medicine

## 2024-07-31 LAB — CBC WITH DIFFERENTIAL/PLATELET
Basophils Absolute: 0.1 x10E3/uL (ref 0.0–0.2)
Basos: 1 %
EOS (ABSOLUTE): 0 x10E3/uL (ref 0.0–0.4)
Eos: 1 %
Hematocrit: 36.9 % — ABNORMAL LOW (ref 37.5–51.0)
Hemoglobin: 12.5 g/dL — ABNORMAL LOW (ref 13.0–17.7)
Immature Grans (Abs): 0 x10E3/uL (ref 0.0–0.1)
Immature Granulocytes: 0 %
Lymphocytes Absolute: 1.6 x10E3/uL (ref 0.7–3.1)
Lymphs: 24 %
MCH: 37.3 pg — ABNORMAL HIGH (ref 26.6–33.0)
MCHC: 33.9 g/dL (ref 31.5–35.7)
MCV: 110 fL — ABNORMAL HIGH (ref 79–97)
Monocytes Absolute: 0.7 x10E3/uL (ref 0.1–0.9)
Monocytes: 11 %
Neutrophils Absolute: 4.4 x10E3/uL (ref 1.4–7.0)
Neutrophils: 63 %
Platelets: 240 x10E3/uL (ref 150–450)
RBC: 3.35 x10E6/uL — ABNORMAL LOW (ref 4.14–5.80)
RDW: 12.6 % (ref 11.6–15.4)
WBC: 6.8 x10E3/uL (ref 3.4–10.8)

## 2024-07-31 LAB — CMP14+EGFR
ALT: 15 IU/L (ref 0–44)
AST: 42 IU/L — ABNORMAL HIGH (ref 0–40)
Albumin: 4.2 g/dL (ref 3.8–4.9)
Alkaline Phosphatase: 159 IU/L — ABNORMAL HIGH (ref 47–123)
BUN/Creatinine Ratio: 5 — ABNORMAL LOW (ref 9–20)
BUN: 4 mg/dL — ABNORMAL LOW (ref 6–24)
Bilirubin Total: 0.9 mg/dL (ref 0.0–1.2)
CO2: 26 mmol/L (ref 20–29)
Calcium: 8.9 mg/dL (ref 8.7–10.2)
Chloride: 96 mmol/L (ref 96–106)
Creatinine, Ser: 0.77 mg/dL (ref 0.76–1.27)
Globulin, Total: 3 g/dL (ref 1.5–4.5)
Glucose: 83 mg/dL (ref 70–99)
Potassium: 3.8 mmol/L (ref 3.5–5.2)
Sodium: 140 mmol/L (ref 134–144)
Total Protein: 7.2 g/dL (ref 6.0–8.5)
eGFR: 107 mL/min/1.73 (ref >59)

## 2024-08-03 ENCOUNTER — Telehealth: Payer: Self-pay | Admitting: Internal Medicine

## 2024-08-03 ENCOUNTER — Ambulatory Visit (INDEPENDENT_AMBULATORY_CARE_PROVIDER_SITE_OTHER)

## 2024-08-03 DIAGNOSIS — Z23 Encounter for immunization: Secondary | ICD-10-CM

## 2024-08-03 NOTE — Telephone Encounter (Signed)
 SABRA

## 2024-08-03 NOTE — Telephone Encounter (Signed)
 Patient came by the office said he has not received all his medicines at the pharmacy and he does not know which medicines.  Pharmacy: [facility] [address]

## 2024-08-03 NOTE — Telephone Encounter (Signed)
 Pt states pharmacy was out of one of his medications pharmacy will let him know when they are back in stock.

## 2024-08-03 NOTE — Progress Notes (Signed)
 Patient is in office today for a nurse visit for Immunization. Patient Injection was given in the  Right deltoid. Patient tolerated injection well.

## 2024-10-10 ENCOUNTER — Emergency Department (HOSPITAL_COMMUNITY)

## 2024-10-10 ENCOUNTER — Other Ambulatory Visit: Payer: Self-pay

## 2024-10-10 ENCOUNTER — Encounter (HOSPITAL_COMMUNITY): Payer: Self-pay

## 2024-10-10 ENCOUNTER — Inpatient Hospital Stay (HOSPITAL_COMMUNITY)
Admission: EM | Admit: 2024-10-10 | Discharge: 2024-10-21 | DRG: 896 | Disposition: A | Discharge status: Skilled Nursing Facility | Attending: Family Medicine | Admitting: Family Medicine

## 2024-10-10 DIAGNOSIS — F10931 Alcohol use, unspecified with withdrawal delirium: Secondary | ICD-10-CM | POA: Diagnosis not present

## 2024-10-10 DIAGNOSIS — Z720 Tobacco use: Secondary | ICD-10-CM | POA: Diagnosis present

## 2024-10-10 DIAGNOSIS — E162 Hypoglycemia, unspecified: Secondary | ICD-10-CM | POA: Diagnosis present

## 2024-10-10 DIAGNOSIS — J449 Chronic obstructive pulmonary disease, unspecified: Secondary | ICD-10-CM | POA: Diagnosis present

## 2024-10-10 DIAGNOSIS — I10 Essential (primary) hypertension: Secondary | ICD-10-CM | POA: Diagnosis not present

## 2024-10-10 DIAGNOSIS — G9341 Metabolic encephalopathy: Secondary | ICD-10-CM | POA: Diagnosis not present

## 2024-10-10 DIAGNOSIS — N179 Acute kidney failure, unspecified: Secondary | ICD-10-CM | POA: Diagnosis not present

## 2024-10-10 DIAGNOSIS — F1090 Alcohol use, unspecified, uncomplicated: Secondary | ICD-10-CM

## 2024-10-10 DIAGNOSIS — K709 Alcoholic liver disease, unspecified: Secondary | ICD-10-CM | POA: Diagnosis present

## 2024-10-10 LAB — CBG MONITORING, ED
Glucose-Capillary: 15 mg/dL — CL (ref 70–99)
Glucose-Capillary: 165 mg/dL — ABNORMAL HIGH (ref 70–99)
Glucose-Capillary: 79 mg/dL (ref 70–99)
Glucose-Capillary: 132 mg/dL — ABNORMAL HIGH (ref 70–99)
Glucose-Capillary: 158 mg/dL — ABNORMAL HIGH (ref 70–99)

## 2024-10-10 LAB — RENAL FUNCTION PANEL
Albumin: 3.6 g/dL (ref 3.5–5.0)
Anion gap: 24 — ABNORMAL HIGH (ref 5–15)
BUN: 21 mg/dL — ABNORMAL HIGH (ref 6–20)
CO2: 19 mmol/L — ABNORMAL LOW (ref 22–32)
Calcium: 7.3 mg/dL — ABNORMAL LOW (ref 8.9–10.3)
Chloride: 103 mmol/L (ref 98–111)
Creatinine, Ser: 2.08 mg/dL — ABNORMAL HIGH (ref 0.61–1.24)
GFR, Estimated: 37 mL/min — ABNORMAL LOW (ref >60)
Glucose, Bld: 228 mg/dL — ABNORMAL HIGH (ref 70–99)
Phosphorus: 4.1 mg/dL (ref 2.5–4.6)
Potassium: 3.6 mmol/L (ref 3.5–5.1)
Sodium: 146 mmol/L — ABNORMAL HIGH (ref 135–145)

## 2024-10-10 LAB — URINALYSIS, W/ REFLEX TO CULTURE (INFECTION SUSPECTED)
Bacteria, UA: NONE SEEN
Bilirubin Urine: NEGATIVE
Glucose, UA: NEGATIVE mg/dL
Hgb urine dipstick: NEGATIVE
Ketones, ur: 20 mg/dL — AB
Leukocytes,Ua: NEGATIVE
Nitrite: NEGATIVE
Protein, ur: 30 mg/dL — AB
Specific Gravity, Urine: 1.014 (ref 1.005–1.030)
pH: 5 (ref 5.0–8.0)

## 2024-10-10 LAB — CBC WITH DIFFERENTIAL/PLATELET
Abs Immature Granulocytes: 0.08 K/uL — ABNORMAL HIGH (ref 0.00–0.07)
Basophils Absolute: 0 K/uL (ref 0.0–0.1)
Basophils Relative: 0 %
Eosinophils Absolute: 0 K/uL (ref 0.0–0.5)
Eosinophils Relative: 0 %
HCT: 42.9 % (ref 39.0–52.0)
Hemoglobin: 13.5 g/dL (ref 13.0–17.0)
Immature Granulocytes: 1 %
Lymphocytes Relative: 11 %
Lymphs Abs: 1.2 K/uL (ref 0.7–4.0)
MCH: 35.9 pg — ABNORMAL HIGH (ref 26.0–34.0)
MCHC: 31.5 g/dL (ref 30.0–36.0)
MCV: 114.1 fL — ABNORMAL HIGH (ref 80.0–100.0)
Monocytes Absolute: 0.9 K/uL (ref 0.1–1.0)
Monocytes Relative: 8 %
Neutro Abs: 8.9 K/uL — ABNORMAL HIGH (ref 1.7–7.7)
Neutrophils Relative %: 80 %
Platelets: 147 K/uL — ABNORMAL LOW (ref 150–400)
RBC: 3.76 MIL/uL — ABNORMAL LOW (ref 4.22–5.81)
RDW: 13.2 % (ref 11.5–15.5)
Smear Review: NORMAL
WBC: 11.1 K/uL — ABNORMAL HIGH (ref 4.0–10.5)
nRBC: 0 % (ref 0.0–0.2)

## 2024-10-10 LAB — I-STAT CHEM 8, ED
BUN: 22 mg/dL — ABNORMAL HIGH (ref 6–20)
Calcium, Ion: 0.89 mmol/L — CL (ref 1.15–1.40)
Chloride: 108 mmol/L (ref 98–111)
Creatinine, Ser: 3.3 mg/dL — ABNORMAL HIGH (ref 0.61–1.24)
Glucose, Bld: 207 mg/dL — ABNORMAL HIGH (ref 70–99)
HCT: 41 % (ref 39.0–52.0)
Hemoglobin: 13.9 g/dL (ref 13.0–17.0)
Potassium: 4.2 mmol/L (ref 3.5–5.1)
Sodium: 145 mmol/L (ref 135–145)
TCO2: 13 mmol/L — ABNORMAL LOW (ref 22–32)

## 2024-10-10 LAB — GLUCOSE, CAPILLARY
Glucose-Capillary: 227 mg/dL — ABNORMAL HIGH (ref 70–99)
Glucose-Capillary: 250 mg/dL — ABNORMAL HIGH (ref 70–99)
Glucose-Capillary: 280 mg/dL — ABNORMAL HIGH (ref 70–99)

## 2024-10-10 LAB — BLOOD GAS, VENOUS
Acid-Base Excess: 7.2 mmol/L — ABNORMAL HIGH (ref 0.0–2.0)
Acid-base deficit: 17.1 mmol/L — ABNORMAL HIGH (ref 0.0–2.0)
Bicarbonate: 12.1 mmol/L — ABNORMAL LOW (ref 20.0–28.0)
Bicarbonate: 31.2 mmol/L — ABNORMAL HIGH (ref 20.0–28.0)
Drawn by: 44828
Drawn by: 7049
O2 Saturation: 39.3 %
O2 Saturation: 73.3 %
Patient temperature: 36.9
Patient temperature: 37.1
pCO2, Ven: 40 mmHg — ABNORMAL LOW (ref 44–60)
pCO2, Ven: 41 mmHg — ABNORMAL LOW (ref 44–60)
pH, Ven: 7.09 — CL (ref 7.25–7.43)
pH, Ven: 7.49 — ABNORMAL HIGH (ref 7.25–7.43)
pO2, Ven: 31 mmHg — CL (ref 32–45)
pO2, Ven: 42 mmHg (ref 32–45)

## 2024-10-10 LAB — RETICULOCYTES
Immature Retic Fract: 11.9 % (ref 2.3–15.9)
RBC.: 3.23 MIL/uL — ABNORMAL LOW (ref 4.22–5.81)
Retic Count, Absolute: 61.4 K/uL (ref 19.0–186.0)
Retic Ct Pct: 1.9 % (ref 0.4–3.1)

## 2024-10-10 LAB — BASIC METABOLIC PANEL WITH GFR
Anion gap: 15 (ref 5–15)
BUN: 19 mg/dL (ref 6–20)
CO2: 27 mmol/L (ref 22–32)
Calcium: 7.6 mg/dL — ABNORMAL LOW (ref 8.9–10.3)
Chloride: 102 mmol/L (ref 98–111)
Creatinine, Ser: 1.58 mg/dL — ABNORMAL HIGH (ref 0.61–1.24)
GFR, Estimated: 52 mL/min — ABNORMAL LOW (ref >60)
Glucose, Bld: 232 mg/dL — ABNORMAL HIGH (ref 70–99)
Potassium: 3.1 mmol/L — ABNORMAL LOW (ref 3.5–5.1)
Sodium: 144 mmol/L (ref 135–145)

## 2024-10-10 LAB — URINE DRUG SCREEN
Amphetamines: NEGATIVE
Barbiturates: NEGATIVE
Benzodiazepines: NEGATIVE
Cocaine: NEGATIVE
Fentanyl: NEGATIVE
Methadone Scn, Ur: NEGATIVE
Opiates: NEGATIVE
Tetrahydrocannabinol: NEGATIVE

## 2024-10-10 LAB — IRON AND TIBC
Iron: 187 ug/dL — ABNORMAL HIGH (ref 45–182)
Saturation Ratios: 86 % — ABNORMAL HIGH (ref 17.9–39.5)
TIBC: 218 ug/dL — ABNORMAL LOW (ref 250–450)
UIBC: 31 ug/dL

## 2024-10-10 LAB — COMPREHENSIVE METABOLIC PANEL WITH GFR
ALT: 29 U/L (ref 0–44)
AST: 127 U/L — ABNORMAL HIGH (ref 15–41)
Albumin: 4.1 g/dL (ref 3.5–5.0)
Alkaline Phosphatase: 102 U/L (ref 38–126)
Anion gap: 42 — ABNORMAL HIGH (ref 5–15)
BUN: 20 mg/dL (ref 6–20)
CO2: 10 mmol/L — ABNORMAL LOW (ref 22–32)
Calcium: 7.7 mg/dL — ABNORMAL LOW (ref 8.9–10.3)
Chloride: 99 mmol/L (ref 98–111)
Creatinine, Ser: 2.68 mg/dL — ABNORMAL HIGH (ref 0.61–1.24)
GFR, Estimated: 28 mL/min — ABNORMAL LOW (ref >60)
Glucose, Bld: 198 mg/dL — ABNORMAL HIGH (ref 70–99)
Potassium: 4.1 mmol/L (ref 3.5–5.1)
Sodium: 150 mmol/L — ABNORMAL HIGH (ref 135–145)
Total Bilirubin: 0.8 mg/dL (ref 0.0–1.2)
Total Protein: 6.9 g/dL (ref 6.5–8.1)

## 2024-10-10 LAB — MRSA NEXT GEN BY PCR, NASAL: MRSA by PCR Next Gen: NOT DETECTED

## 2024-10-10 LAB — LACTIC ACID, PLASMA
Lactic Acid, Venous: 4.1 mmol/L (ref 0.5–1.9)
Lactic Acid, Venous: 4.1 mmol/L (ref 0.5–1.9)
Lactic Acid, Venous: 4.8 mmol/L (ref 0.5–1.9)

## 2024-10-10 LAB — FOLATE: Folate: 8.5 ng/mL (ref >5.9)

## 2024-10-10 LAB — MAGNESIUM: Magnesium: 1.1 mg/dL — ABNORMAL LOW (ref 1.7–2.4)

## 2024-10-10 LAB — ETHANOL: Alcohol, Ethyl (B): 45 mg/dL — ABNORMAL HIGH (ref <15)

## 2024-10-10 LAB — VITAMIN B12: Vitamin B-12: 593 pg/mL (ref 180–914)

## 2024-10-10 LAB — FERRITIN: Ferritin: 1169 ng/mL — ABNORMAL HIGH (ref 24–336)

## 2024-10-10 MED ORDER — DEXTROSE 10 % IV SOLN: INTRAVENOUS | Status: AC

## 2024-10-10 MED ORDER — HEPARIN SODIUM (PORCINE) 5000 UNIT/ML IJ SOLN
5000.0000 [IU] | Freq: Three times a day (TID) | INTRAMUSCULAR | Status: DC
  Administered 2024-10-11 – 2024-10-21 (×31): 5000 [IU] via SUBCUTANEOUS
  Filled 2024-10-10 (×28): qty 1

## 2024-10-10 MED ORDER — SODIUM BICARBONATE 8.4 % IV SOLN
25.0000 meq | Freq: Once | INTRAVENOUS | Status: AC
  Administered 2024-10-10: 25 meq via INTRAVENOUS

## 2024-10-10 MED ORDER — MAGNESIUM SULFATE 4 GM/100ML IV SOLN
4.0000 g | Freq: Once | INTRAVENOUS | Status: AC
  Administered 2024-10-10: 4 g via INTRAVENOUS
  Filled 2024-10-10: qty 100

## 2024-10-10 MED ORDER — POTASSIUM CHLORIDE 10 MEQ/100ML IV SOLN
10.0000 meq | INTRAVENOUS | Status: AC
  Administered 2024-10-10 – 2024-10-11 (×6): 10 meq via INTRAVENOUS
  Filled 2024-10-10 (×6): qty 100

## 2024-10-10 MED ORDER — ONDANSETRON HCL 4 MG/2ML IJ SOLN: 4.0000 mg | Freq: Four times a day (QID) | INTRAMUSCULAR | Status: DC | PRN

## 2024-10-10 MED ORDER — SODIUM CHLORIDE 0.9% FLUSH
3.0000 mL | Freq: Two times a day (BID) | INTRAVENOUS | Status: DC
  Administered 2024-10-10 – 2024-10-21 (×22): 3 mL via INTRAVENOUS

## 2024-10-10 MED ORDER — ACETAMINOPHEN 650 MG RE SUPP: 650.0000 mg | Freq: Four times a day (QID) | RECTAL | Status: DC | PRN

## 2024-10-10 MED ORDER — THIAMINE HCL 100 MG/ML IJ SOLN
100.0000 mg | Freq: Every day | INTRAMUSCULAR | Status: DC
  Administered 2024-10-11 – 2024-10-12 (×2): 100 mg via INTRAVENOUS
  Filled 2024-10-10 (×2): qty 2

## 2024-10-10 MED ORDER — ADULT MULTIVITAMIN W/MINERALS CH
1.0000 | ORAL_TABLET | Freq: Every day | ORAL | Status: DC
  Administered 2024-10-11: 1 via ORAL
  Filled 2024-10-10: qty 1

## 2024-10-10 MED ORDER — POLYETHYLENE GLYCOL 3350 17 G PO PACK: 17.0000 g | PACK | Freq: Every day | ORAL | Status: DC | PRN

## 2024-10-10 MED ORDER — SODIUM CHLORIDE 0.9% FLUSH: 3.0000 mL | INTRAVENOUS | Status: DC | PRN

## 2024-10-10 MED ORDER — SODIUM BICARBONATE 8.4 % IV SOLN
50.0000 meq | Freq: Once | INTRAVENOUS | Status: AC
  Administered 2024-10-10: 50 meq via INTRAVENOUS
  Filled 2024-10-10: qty 50

## 2024-10-10 MED ORDER — DIAZEPAM 2 MG PO TABS
2.0000 mg | ORAL_TABLET | Freq: Three times a day (TID) | ORAL | Status: AC
  Administered 2024-10-11 (×3): 2 mg via ORAL
  Filled 2024-10-10 (×3): qty 1

## 2024-10-10 MED ORDER — CHLORHEXIDINE GLUCONATE CLOTH 2 % EX PADS
6.0000 | MEDICATED_PAD | Freq: Every day | CUTANEOUS | Status: DC
  Administered 2024-10-11 – 2024-10-21 (×9): 6 via TOPICAL

## 2024-10-10 MED ORDER — LORAZEPAM 2 MG/ML IJ SOLN
1.0000 mg | INTRAMUSCULAR | Status: DC | PRN
  Administered 2024-10-10: 1 mg via INTRAVENOUS
  Administered 2024-10-10: 2 mg via INTRAVENOUS
  Administered 2024-10-11: 1 mg via INTRAVENOUS
  Administered 2024-10-12 (×2): 2 mg via INTRAVENOUS
  Filled 2024-10-10 (×5): qty 1

## 2024-10-10 MED ORDER — DEXTROSE IN LACTATED RINGERS 5 % IV SOLN: INTRAVENOUS | Status: AC

## 2024-10-10 MED ORDER — LACTATED RINGERS IV BOLUS
1000.0000 mL | Freq: Once | INTRAVENOUS | Status: AC
  Administered 2024-10-10: 1000 mL via INTRAVENOUS

## 2024-10-10 MED ORDER — NICOTINE 21 MG/24HR TD PT24
21.0000 mg | MEDICATED_PATCH | Freq: Every day | TRANSDERMAL | Status: DC
  Administered 2024-10-10 – 2024-10-21 (×11): 21 mg via TRANSDERMAL
  Filled 2024-10-10 (×12): qty 1

## 2024-10-10 MED ORDER — DEXTROSE 50 % IV SOLN
50.0000 mL | Freq: Once | INTRAVENOUS | Status: AC
  Administered 2024-10-10: 50 mL via INTRAVENOUS

## 2024-10-10 MED ORDER — ONDANSETRON HCL 4 MG PO TABS: 4.0000 mg | ORAL_TABLET | Freq: Four times a day (QID) | ORAL | Status: DC | PRN

## 2024-10-10 MED ORDER — ACETAMINOPHEN 325 MG PO TABS: 650.0000 mg | ORAL_TABLET | Freq: Four times a day (QID) | ORAL | Status: DC | PRN

## 2024-10-10 MED ORDER — ALBUTEROL SULFATE (2.5 MG/3ML) 0.083% IN NEBU: 2.5000 mg | INHALATION_SOLUTION | RESPIRATORY_TRACT | Status: DC | PRN

## 2024-10-10 MED ORDER — SODIUM CHLORIDE 0.9% FLUSH
3.0000 mL | Freq: Two times a day (BID) | INTRAVENOUS | Status: DC
  Administered 2024-10-10 – 2024-10-21 (×21): 3 mL via INTRAVENOUS

## 2024-10-10 MED ORDER — AMLODIPINE BESYLATE 5 MG PO TABS
5.0000 mg | ORAL_TABLET | Freq: Every day | ORAL | Status: DC
  Administered 2024-10-11 – 2024-10-17 (×4): 5 mg via ORAL
  Filled 2024-10-10 (×6): qty 1

## 2024-10-10 MED ORDER — LORAZEPAM 1 MG PO TABS: 1.0000 mg | ORAL_TABLET | ORAL | Status: DC | PRN

## 2024-10-10 MED ORDER — LACTATED RINGERS IV BOLUS
1000.0000 mL | Freq: Once | INTRAVENOUS | Status: AC
Start: 2024-10-10 — End: 2024-10-10
  Administered 2024-10-10: 1000 mL via INTRAVENOUS

## 2024-10-10 MED ORDER — BISACODYL 10 MG RE SUPP: 10.0000 mg | Freq: Every day | RECTAL | Status: DC | PRN

## 2024-10-10 MED ORDER — SODIUM CHLORIDE 0.9 % IV SOLN: INTRAVENOUS | Status: AC | PRN

## 2024-10-10 MED ORDER — THIAMINE HCL 100 MG/ML IJ SOLN
100.0000 mg | Freq: Once | INTRAMUSCULAR | Status: AC
  Administered 2024-10-10: 100 mg via INTRAVENOUS
  Filled 2024-10-10: qty 2

## 2024-10-10 MED ORDER — THIAMINE MONONITRATE 100 MG PO TABS
100.0000 mg | ORAL_TABLET | Freq: Every day | ORAL | Status: DC
  Filled 2024-10-10: qty 1

## 2024-10-10 MED ORDER — FOLIC ACID 1 MG PO TABS
1.0000 mg | ORAL_TABLET | Freq: Every day | ORAL | Status: DC
  Administered 2024-10-11: 1 mg via ORAL
  Filled 2024-10-10: qty 1

## 2024-10-10 MED ORDER — LACTATED RINGERS BOLUS PEDS: 1000.0000 mL | INTRAVENOUS | Status: DC

## 2024-10-10 MED ORDER — LABETALOL HCL 5 MG/ML IV SOLN: 10.0000 mg | INTRAVENOUS | Status: DC | PRN

## 2024-10-10 NOTE — Significant Event (Signed)
       CROSS COVER NOTE  NAME: Donald Stewart MRN: [id number] DOB : 04/12/1971 ATTENDING PHYSICIAN: Pearlean Manus, MD    Date of Service   10/10/2024   HPI/Events of Note   [facility] Cross Cover at [facility] Notified patient on d10 and cbg 200's  HPI Patient admitted today with alcohol induced hypoglycemia. 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  Bicarb deficit on initial labs significant and in addition to dextrose , patient received 3 amps sodium bicarb. Dehydration induced AKI and hypernatremia also evident.  Repeat chem opanel this afternoon (1547) showed improved sodium from 150 to 146 and bicarb improved from 10 to 19  Interventions   Assessment/Plan: Blood pressure 135/77, pulse (!) 132, temperature 98.7 F (37.1 C), temperature source Oral, resp. rate (!) 27, height 5' 7 (1.702 m), weight 52.2 kg, SpO2 97%.   Latest Reference Range & Units 10/10/24 22:27  pH, Ven 7.25 - 7.43  7.49 (H)  pCO2, Ven 44 - 60 mmHg 41 (L)  pO2, Ven 32 - 45 mmHg 42  Acid-Base Excess 0.0 - 2.0 mmol/L 7.2 (H)  Bicarbonate 20.0 - 28.0 mmol/L 31.2 (H)  O2 Saturation % 73.3  Patient temperature  37.1  Collection site  BLOOD LEFT FOREARM  (H): Data is abnormally high (L): Data is abnormally low    Latest Ref Rng & Units 10/11/2024    4:04 AM 10/10/2024   10:27 PM 10/10/2024    3:47 PM  BMP  Glucose 70 - 99 mg/dL 814  767  771   BUN 6 - 20 mg/dL 15  19  21    Creatinine 0.61 - 1.24 mg/dL 8.75  8.41  7.91   Sodium 135 - 145 mmol/L 143  144  146   Potassium 3.5 - 5.1 mmol/L 3.7  3.1  3.6   Chloride 98 - 111 mmol/L 101  102  103   CO2 22 - 32 mmol/L 29  27  19    Calcium 8.9 - 10.3 mg/dL 8.0  7.6  7.3     Mag level 1.1 Change iv fluids D5LR at 125 4 gm mag IV      Erminio LITTIE Cone NP [facility] Cross Cover 7pm-7am - check amion for availability Pager [phone]

## 2024-10-10 NOTE — ED Provider Notes (Signed)
 [facility] AT [facility] Provider Note   CSN: [id number] Arrival date & time: 10/10/24  9161     Patient presents with: Hypoglycemia and Loss of Consciousness   Donald Stewart is a 53 y.o. male.    Hypoglycemia Loss of Consciousness Patient brought in by EMS.  Unresponsive.  Reportedly CBG of 34 on scene.  Reportedly has been drinking alcohol heavily for several days and then unresponsive today.  Patient cannot provide any history.  IM glucagon given by EMS but had been unable to get IV access.    Past Medical History:  Diagnosis Date   Asthma    Avascular necrosis of hip, right (HCC) 06/08/2021   Hypercholesterolemia    Hypertension     Prior to Admission medications   Medication Sig Start Date End Date Taking? Authorizing Provider  albuterol  (VENTOLIN  HFA) 108 (90 Base) MCG/ACT inhaler Inhale 2 puffs into the lungs every 6 (six) hours as needed for wheezing or shortness of breath. 07/29/23   [name], MD  amLODipine  (NORVASC ) 5 MG tablet Take 1 tablet (5 mg total) by mouth daily. 07/30/24   [name], MD  Chlorphen-PE-Acetaminophen  (NOREL AD) 4-10-325 MG TABS Take 1 tablet by mouth 3 (three) times daily as needed. 07/30/24   [name], MD  fluticasone  (FLONASE ) 50 MCG/ACT nasal spray Place 2 sprays into both nostrils daily. 07/30/24   [name], MD  folic acid  (FOLVITE ) 1 MG tablet Take 1 tablet (1 mg total) by mouth daily. 07/30/24   [name], MD  ibuprofen  (ADVIL ) 800 MG tablet Take 800 mg by mouth 3 (three) times daily. 05/14/23   [provider]  potassium chloride  SA (KLOR-CON  M) 20 MEQ tablet Take 1 tablet (20 mEq total) by mouth daily. 07/30/24   [name], MD    Allergies: Patient has no known allergies.    Review of Systems  Cardiovascular:  Positive for syncope.    Updated Vital Signs BP (!) 160/141   Pulse (!) 152   Temp 98.5 F (36.9 C) (Rectal)   Resp 19   SpO2 100%    Physical Exam Vitals and nursing note reviewed.  Cardiovascular:     Rate and Rhythm: Regular rhythm. Tachycardia present.  Pulmonary:     Breath sounds: No wheezing.  Abdominal:     Tenderness: There is no abdominal tenderness.  Neurological:     Comments: Decreased responsiveness.  Minimal response to pain.  Breathing spontaneously.     (all labs ordered are listed, but only abnormal results are displayed) Labs Reviewed  COMPREHENSIVE METABOLIC PANEL WITH GFR - Abnormal; Notable for the following components:      Result Value   Sodium 150 (*)    CO2 10 (*)    Glucose, Bld 198 (*)    Creatinine, Ser 2.68 (*)    Calcium 7.7 (*)    AST 127 (*)    GFR, Estimated 28 (*)    Anion gap 42 (*)    All other components within normal limits  CBC WITH DIFFERENTIAL/PLATELET - Abnormal; Notable for the following components:   WBC 11.1 (*)    RBC 3.76 (*)    MCV 114.1 (*)    MCH 35.9 (*)    Platelets 147 (*)    Neutro Abs 8.9 (*)    Abs Immature Granulocytes 0.08 (*)    All other components within normal limits  LACTIC ACID, PLASMA - Abnormal; Notable for the  following components:   Lactic Acid, Venous 4.1 (*)    All other components within normal limits  ETHANOL - Abnormal; Notable for the following components:   Alcohol, Ethyl (B) 45 (*)    All other components within normal limits  BLOOD GAS, VENOUS - Abnormal; Notable for the following components:   pH, Ven 7.09 (*)    pCO2, Ven 40 (*)    pO2, Ven 31 (*)    Bicarbonate 12.1 (*)    Acid-base deficit 17.1 (*)    All other components within normal limits  CBG MONITORING, ED - Abnormal; Notable for the following components:   Glucose-Capillary 15 (*)    All other components within normal limits  I-STAT CHEM 8, ED - Abnormal; Notable for the following components:   BUN 22 (*)    Creatinine, Ser 3.30 (*)    Glucose, Bld 207 (*)    Calcium, Ion 0.89 (*)    TCO2 13 (*)    All other components within normal limits  CBG  MONITORING, ED - Abnormal; Notable for the following components:   Glucose-Capillary 165 (*)    All other components within normal limits  CBG MONITORING, ED - Abnormal; Notable for the following components:   Glucose-Capillary 158 (*)    All other components within normal limits  URINE DRUG SCREEN  URINALYSIS, W/ REFLEX TO CULTURE (INFECTION SUSPECTED)  CBG MONITORING, ED    EKG: EKG Interpretation Date/Time:  Saturday October 10 2024 08:46:08 EST Ventricular Rate:  150 PR Interval:  133 QRS Duration:  91 QT Interval:  302 QTC Calculation: 478 R Axis:   15  Text Interpretation: duplicate Confirmed by Patsey Lot [phone]) on 10/10/2024 9:03:05 AM  Radiology: CT Head Wo Contrast Result Date: 10/10/2024 EXAM: CT Head Without TECHNIQUE: CT of the head was performed without the administration of intravenous contrast. Automated exposure control, iterative reconstruction, and/or weight-based adjustment of the mA/kV was utilized to reduce the radiation dose to as low as reasonably achievable. COMPARISON: CT Head dated 08/04/2020. CLINICAL HISTORY: Mental status change, alcohol/drug use. FINDINGS: BRAIN AND VENTRICLES: Stable age-related cerebral and cerebellar atrophy. No acute intracranial hemorrhage. No mass effect or midline shift. No extra-axial fluid collection. No evidence of acute infarct. No hydrocephalus. ORBITS: No acute abnormality. SINUSES AND MASTOIDS: No acute abnormality. SOFT TISSUES AND SKULL: No acute skull fracture. No acute soft tissue abnormality. IMPRESSION: 1. No acute intracranial abnormality. 2. Stable age-advanced cerebral and cerebellar atrophy. Electronically signed by: Norleen Kil MD 10/10/2024 10:08 AM EST RP Workstation: [id number]   DG Chest Portable 1 View Result Date: 10/10/2024 EXAM: 1 View Xray Of The Chest 10/10/2024 09:15:58 Am COMPARISON: 11/05/2023 CLINICAL HISTORY: Ams FINDINGS: LUNGS AND PLEURA: No focal pulmonary opacity. No pleural effusion. No  pneumothorax. HEART AND MEDIASTINUM: No acute abnormality of the cardiac and mediastinal silhouettes. BONES AND SOFT TISSUES: No acute osseous abnormality. IMPRESSION: 1. No acute process. Electronically signed by: Norleen Kil MD 10/10/2024 10:05 AM EST RP Workstation: [id number]     Procedures   Medications Ordered in the ED  dextrose  50 % solution 50 mL (50 mLs Intravenous Given 10/10/24 0850)  lactated ringers  bolus 1,000 mL (1,000 mLs Intravenous New Bag/Given 10/10/24 0908)  lactated ringers  bolus 1,000 mL (1,000 mLs Intravenous New Bag/Given 10/10/24 0950)  thiamine  (VITAMIN B1) injection 100 mg (100 mg Intravenous Given 10/10/24 0945)    Clinical Course as of 10/10/24 1024  Sat Oct 10, 2024  0911 I-STAT reviewed.  Normal potassium.  However creatinine of  3.3 bicarb of 13.  Sugar of 207. [NP]    Clinical Course User Index [NP] Patsey Lot, MD                                 Medical Decision Making Amount and/or Complexity of Data Reviewed Labs: ordered. Radiology: ordered.  Risk Prescription drug management. Decision regarding hospitalization.   Patient mental status change.  Severe hypoglycemia.  However very difficult access.  I attempted EJ access without good flow.  Access obtained by nursing.  D50 given.  Does have some improvement in mental status.  Now following some commands.  Tachycardia.  Differential diagnosis does include infection, encephalopathy. Patient is not diabetic.  Had similar admission around a year ago.  EKG does appear sinus.  I-STAT shows acute kidney injury with creatinine now up to 3.3.  However normal potassium.  Continue to recheck sugar.  If decreases again will likely need dextrose  drip.  Sugars so far has stayed up.  Lactic acid is elevated but do not see any source of infection at this time.  Not febrile.  Potentially could be up due to the hypoglycemia and likely dehydration.  Heart rate has remained up.  Mental status has improved  however.  Will discuss with hospitalist for admission.   CRITICAL CARE Performed by: Lot Patsey Total critical care time: 45 minutes Critical care time was exclusive of separately billable procedures and treating other patients. Critical care was necessary to treat or prevent imminent or life-threatening deterioration. Critical care was time spent personally by me on the following activities: development of treatment plan with patient and/or surrogate as well as nursing, discussions with consultants, evaluation of patient's response to treatment, examination of patient, obtaining history from patient or surrogate, ordering and performing treatments and interventions, ordering and review of laboratory studies, ordering and review of radiographic studies, pulse oximetry and re-evaluation of patient's condition.       Final diagnoses:  AKI (acute kidney injury)  Hypoglycemia  Alcohol use    ED Discharge Orders     None          Patsey Lot, MD 10/10/24 1024

## 2024-10-10 NOTE — H&P (Addendum)
 History and Physical    Patient: Donald Stewart FMW:[id number] DOB: September 02, 1971 DOA: 10/10/2024 DOS: the patient was seen and examined on 10/10/2024 PCP: [name], MD  Patient coming from: Home  Chief Complaint:  Chief Complaint  Patient presents with   Hypoglycemia   Loss of Consciousness   HPI: Donald Stewart is a 53 y.o. male with medical history significant alcohol and tobacco abuse, asthma/COPD, avascular necrosis of the right hip and HLD presents to the ED unresponsive.- Found to have blood sugar of 15 in the setting of heavy alcohol use over the last couple days as per family members -EMS gave patient IM glucagon due to inability to obtain IV access initially =-IV dextrose  subsequently given -Patient had very similar presentation back in December 2024 with heavy alcohol use and hypoglycemia with altered mentation .  No fevers noted, no vomiting or diarrhea -No reported falls or head injury -Chest x-ray and CT head without acute findings - UA with ketones and protein otherwise negative - UDS negative -Additional history obtained from  pts mother--Donald Stewart--[phone] - AST 127 ALT 29 alk phos 102 T. bili 0.8, anion gap is 42, potassium is 4.1 -WBC 11.1 hemoglobin 13.5 , platelets 147 Sodium 150, bicarb down to 10 glucose after IV dextrose  and IM glucagon was up to 198 - creatinine 3.30 baseline usually around 0.7 -Initial lactic acid 4.1 repeat is pending, -Blood alcohol level 45 -Ferritin, B12 folate and iron are not low -EKG sinus tachycardia - Review of Systems: As mentioned in the history of present illness. All other systems reviewed and are negative. Past Medical History:  Diagnosis Date   Asthma    Avascular necrosis of hip, right (HCC) 06/08/2021   Hypercholesterolemia    Hypertension    Past Surgical History:  Procedure Laterality Date   TOTAL HIP ARTHROPLASTY Right    Social History:  reports that he has been smoking cigarettes. He  has a 10 pack-year smoking history. He has never used smokeless tobacco. He reports current alcohol use. He reports that he does not use drugs.  No Known Allergies  Family History  Problem Relation Age of Onset   Hypertension Mother     Prior to Admission medications   Medication Sig Start Date End Date Taking? Authorizing Provider  albuterol  (VENTOLIN  HFA) 108 (90 Base) MCG/ACT inhaler Inhale 2 puffs into the lungs every 6 (six) hours as needed for wheezing or shortness of breath. 07/29/23   [name], MD  amLODipine  (NORVASC ) 5 MG tablet Take 1 tablet (5 mg total) by mouth daily. 07/30/24   [name], MD  Chlorphen-PE-Acetaminophen  (NOREL AD) 4-10-325 MG TABS Take 1 tablet by mouth 3 (three) times daily as needed. 07/30/24   [name], MD  fluticasone  (FLONASE ) 50 MCG/ACT nasal spray Place 2 sprays into both nostrils daily. 07/30/24   [name], MD  folic acid  (FOLVITE ) 1 MG tablet Take 1 tablet (1 mg total) by mouth daily. 07/30/24   [name], MD  potassium chloride  SA (KLOR-CON  M) 20 MEQ tablet Take 1 tablet (20 mEq total) by mouth daily. 07/30/24   [name], MD    Physical Exam: Vitals:   10/10/24 1200 10/10/24 1300 10/10/24 1400 10/10/24 1500  BP: 127/72 114/64 113/67 119/63  Pulse: (!) 145 (!) 139 (!) 135 (!) 138  Resp: 15 19 20  (!) 22  Temp:      TempSrc:      SpO2: 100% 100% 100% 100%  Weight:      Height:        Physical Exam  Gen:-Somnolent but arousable , no acute distress  HEENT:- [place].AT, No sclera icterus Neck-Supple Neck,No JVD,.  Lungs-  CTAB , fair air movement bilaterally  CV- S1, S2 normal, RRR Abd-  +ve B.Sounds, Abd Soft, No tenderness,    Extremity/Skin:- No  edema,   good pedal pulses  Neuro-Psych-somnolent but arousable, tremors noted, moving all extremities spontaneously.   Data Reviewed: -Chest x-ray and CT head without acute findings - UA with ketones and protein otherwise negative - UDS negative - AST 127 ALT  29 alk phos 102 T. bili 0.8, anion gap is 42, potassium is 4.1 -WBC 11.1 hemoglobin 13.5 , platelets 147 Sodium 150, bicarb down to 10 glucose after IV dextrose  and IM glucagon was up to 198 - creatinine 3.30 baseline usually around 0.7 -Initial lactic acid 4.1 repeat is pending, -Blood alcohol level 45 -Ferritin, B12 folate and iron are not low --EKG sinus tachycardia  Assessment and Plan: 1)Acute Metabolic Encephalopathy--- due to severe hypoglycemia - -No reported falls or head injury -Continue dextrose  infusion -UDS is negative -CT head unremarkable --Patient had very similar presentation back in December 2024 with heavy alcohol use and hypoglycemia with altered mentation - Anticipate improvement in mentation with hydration improved glucose levels  2)Alcohol Abuse--blood alcohol level at 45 =-Patient already had mild tremors - History of heavy alcohol use =-Very risk for bad DTs - Benzos per CIWA protocol - Multivitamin, folic acid  and thiamine as ordered  3)HTN--BP not at goal - Restart PTA amlodipine  - May use IV labetalol as needed elevated BP  4)Tobacco Abuse/COPD--nicotine patch as prescribed - No acute COPD exacerbation - May use as needed albuterol   5)Hypernatremia/Dehydration/Hypoglycemia --- continue dextrose  infusion - Encourage increased oral intake when more awake  6)Anion Gap Metabolic Acidosis with Lactic Acidosis--- -anion gap is 42  - in setting of heavy alcohol intake, and poor nutritional intake -- Should improve with hydration and above measures -- No evidence of significant infection  7) alcoholic hepatitis--AST 127 ALT 29 alk phos 102 T. bili 0.8,  -AST : ALT ratio consistent with alcoholic hepatitis  8)AKI----acute kidney injury  --Creatinine on admission=3.30 - baseline usually around 0.7  -renally adjust medications, avoid nephrotoxic agents / dehydration  / hypotension   Advance Care Planning:   Code Status: Full Code   Family  Communication:  Discussed with pts mother--Donald Stewart--[phone]  Severity of Illness: The appropriate patient status for this patient is INPATIENT. Inpatient status is judged to be reasonable and necessary in order to provide the required intensity of service to ensure the patient's safety. The patient's presenting symptoms, physical exam findings, and initial radiographic and laboratory data in the context of their chronic comorbidities is felt to place them at high risk for further clinical deterioration. Furthermore, it is not anticipated that the patient will be medically stable for discharge from the hospital within 2 midnights of admission.   * I certify that at the point of admission it is my clinical judgment that the patient will require inpatient hospital care spanning beyond 2 midnights from the point of admission due to high intensity of service, high risk for further deterioration and high frequency of surveillance required.*  Author: Rendall Carwin, MD 10/10/2024 4:20 PM  For on call review [email or web address].

## 2024-10-10 NOTE — ED Triage Notes (Signed)
 Pt arrives via [facility] c/c hypoglycemia and unresponsive. Not responsive to painful stimuli. CBG on scene 34. Patient has been drinking alcohol for several days per family.

## 2024-10-10 NOTE — ED Notes (Signed)
 Difficulty obtaining access. Multiple failed attempts.

## 2024-10-11 LAB — COMPREHENSIVE METABOLIC PANEL WITH GFR
ALT: 37 U/L (ref 0–44)
AST: 235 U/L — ABNORMAL HIGH (ref 15–41)
Albumin: 3.6 g/dL (ref 3.5–5.0)
Alkaline Phosphatase: 84 U/L (ref 38–126)
Anion gap: 13 (ref 5–15)
BUN: 15 mg/dL (ref 6–20)
CO2: 29 mmol/L (ref 22–32)
Calcium: 8 mg/dL — ABNORMAL LOW (ref 8.9–10.3)
Chloride: 101 mmol/L (ref 98–111)
Creatinine, Ser: 1.24 mg/dL (ref 0.61–1.24)
GFR, Estimated: >60 mL/min (ref >60)
Glucose, Bld: 185 mg/dL — ABNORMAL HIGH (ref 70–99)
Potassium: 3.7 mmol/L (ref 3.5–5.1)
Sodium: 143 mmol/L (ref 135–145)
Total Bilirubin: 1.4 mg/dL — ABNORMAL HIGH (ref 0.0–1.2)
Total Protein: 6.2 g/dL — ABNORMAL LOW (ref 6.5–8.1)

## 2024-10-11 LAB — GLUCOSE, CAPILLARY
Glucose-Capillary: 111 mg/dL — ABNORMAL HIGH (ref 70–99)
Glucose-Capillary: 209 mg/dL — ABNORMAL HIGH (ref 70–99)
Glucose-Capillary: 166 mg/dL — ABNORMAL HIGH (ref 70–99)
Glucose-Capillary: 171 mg/dL — ABNORMAL HIGH (ref 70–99)
Glucose-Capillary: 149 mg/dL — ABNORMAL HIGH (ref 70–99)
Glucose-Capillary: 139 mg/dL — ABNORMAL HIGH (ref 70–99)

## 2024-10-11 LAB — CBC
HCT: 33.5 % — ABNORMAL LOW (ref 39.0–52.0)
Hemoglobin: 11.5 g/dL — ABNORMAL LOW (ref 13.0–17.0)
MCH: 35.9 pg — ABNORMAL HIGH (ref 26.0–34.0)
MCHC: 34.3 g/dL (ref 30.0–36.0)
MCV: 104.7 fL — ABNORMAL HIGH (ref 80.0–100.0)
Platelets: 129 K/uL — ABNORMAL LOW (ref 150–400)
RBC: 3.2 MIL/uL — ABNORMAL LOW (ref 4.22–5.81)
RDW: 12.3 % (ref 11.5–15.5)
WBC: 8.6 K/uL (ref 4.0–10.5)
nRBC: 0 % (ref 0.0–0.2)

## 2024-10-11 LAB — MAGNESIUM: Magnesium: 2.2 mg/dL (ref 1.7–2.4)

## 2024-10-11 NOTE — Progress Notes (Addendum)
 PROGRESS NOTE  Donald Stewart, is a 53 y.o. male, DOB - [birth year], FMW:[id number]  Admit date - 10/10/2024   Admitting Physician [place] Pearlean, MD  Outpatient Primary MD for the patient is [name], MD  LOS - 1  Chief Complaint  Patient presents with   Hypoglycemia   Loss of Consciousness       Brief Narrative:  53 y.o. male with medical history significant alcohol and tobacco abuse, asthma/COPD, avascular necrosis of the right hip and HLD presents to the ED unresponsive.- Found to have blood sugar of 15 in the setting of heavy alcohol use and poor oral intake prior to admission as per patient's mother   -Assessment and Plan: 1)Acute Metabolic Encephalopathy--- due to severe hypoglycemia (blood glucose was 15 on admission) - -No reported falls or head injury -UDS is negative -CT head unremarkable --Patient had very similar presentation back in December 2024 with heavy alcohol use and hypoglycemia with altered mentation -- Mentation is improving with hydration and dextrose  infusion--    2)Alcohol Abuse--blood alcohol level at 45 =-Patient already had mild tremors on admission - History of heavy alcohol use =-Very high risk for bad DTs - c/n Benzos per CIWA protocol - c/n Multivitamin, folic acid  and thiamine as ordered   3)HTN--BP not at goal - Restarted PTA amlodipine  - May use IV labetalol as needed elevated BP   4)Tobacco Abuse/COPD--c/n nicotine patch as prescribed - No acute COPD exacerbation - May use as needed albuterol    5)Hypernatremia/Dehydration/Hypoglycemia ---  - Glucose and sodium is normalized with iv dextrose  infusion --Dehydration improving -continue dextrose  infusion - Encourage increased oral intake when more awake   6)Anion Gap Metabolic Acidosis with Lactic Acidosis--- -anion gap is 42 >>13 -Bicarb is up to 29 from 10 - in setting of heavy alcohol intake, and poor nutritional intake - No evidence of significant infection   7)  alcoholic hepatitis--AST 127 >>235 ALT 29 >>37 - alk phos 84>> 102  T. bili 0.8 >> 1.4  -AST : ALT ratio consistent with alcoholic hepatitis   8)AKI----acute kidney injury  --Creatinine on admission=3.30 - baseline usually around 0.7  -Creatinine currently down to 1.24 with hydration -renally adjust medications, avoid nephrotoxic agents / dehydration  / hypotension  9) hypokalemia/hypomagnesia--- replaced and normalized  Status is: Inpatient   Disposition: The patient is from: Home              Anticipated d/c is to: Home              Anticipated d/c date is: 1 day              Patient currently is not medically stable to d/c. Barriers: Not Clinically Stable-   Code Status :  -  Code Status: Full Code   Family Communication:     Discussed with pts mother--Judy Mcclane--[phone]   DVT Prophylaxis  :   - SCDs  heparin injection 5,000 Units Start: 10/11/24 0600 SCDs Start: 10/10/24 1114 Place TED hose Start: 10/10/24 1114   Lab Results  Component Value Date   PLT 129 (L) 10/11/2024    Inpatient Medications  Scheduled Meds:  amLODipine   5 mg Oral Daily   Chlorhexidine  Gluconate Cloth  6 each Topical Daily   diazepam   2 mg Oral TID   folic acid   1 mg Oral Daily   heparin  5,000 Units Subcutaneous Q8H   multivitamin with minerals  1 tablet Oral Daily   nicotine  21 mg Transdermal Daily  sodium chloride  flush  3 mL Intravenous Q12H   sodium chloride  flush  3 mL Intravenous Q12H   thiamine  100 mg Oral Daily   Or   thiamine  100 mg Intravenous Daily   Continuous Infusions:  dextrose  5% lactated ringers 125 mL/hr at 10/11/24 1112   PRN Meds:.acetaminophen  **OR** acetaminophen , albuterol , bisacodyl, labetalol, LORazepam  **OR** LORazepam , ondansetron  **OR** ondansetron  (ZOFRAN ) IV, polyethylene glycol, sodium chloride  flush   Anti-infectives (From admission, onward)    None         Subjective: Donald Stewart today has no fevers, no emesis,  No chest  pain,   - More awake, more cooperative  Objective: Vitals:   10/11/24 1000 10/11/24 1100 10/11/24 1200 10/11/24 1300  BP: 108/76 122/87 (!) 140/79 (!) 157/74  Pulse: (!) 118 (!) 110 (!) 106 (!) 105  Resp: 19 19 14 16   Temp:   97.9 F (36.6 C)   TempSrc:   Axillary   SpO2: 100% 100% 100% 98%  Weight:      Height:        Intake/Output Summary (Last 24 hours) at 10/11/2024 1415 Last data filed at 10/11/2024 1112 Gross per 24 hour  Intake 1938.81 ml  Output 1350 ml  Net 588.81 ml   Filed Weights   10/10/24 1038  Weight: 52.2 kg    Physical Exam  Gen:- Awake Alert, cooperative, no acute distress HEENT:- [place].AT, No sclera icterus Neck-Supple Neck,No JVD,.  Lungs-  CTAB , fair symmetrical air movement CV- S1, S2 normal, regular  Abd-  +ve B.Sounds, Abd Soft, No tenderness,    Extremity/Skin:- No  edema, pedal pulses present  Psych-affect is flat, oriented x3 Neuro-generalized weakness, no new focal deficits, +ve tremors  Data Reviewed: I have personally reviewed following labs and imaging studies  CBC: Recent Labs  Lab 10/10/24 0908 10/10/24 0911 10/11/24 0404  WBC  --  11.1* 8.6  NEUTROABS  --  8.9*  --   HGB 13.9 13.5 11.5*  HCT 41.0 42.9 33.5*  MCV  --  114.1* 104.7*  PLT  --  147* 129*   Basic Metabolic Panel: Recent Labs  Lab 10/10/24 0908 10/10/24 0911 10/10/24 1547 10/10/24 2227 10/11/24 0404  NA 145 150* 146* 144 143  K 4.2 4.1 3.6 3.1* 3.7  CL 108 99 103 102 101  CO2  --  10* 19* 27 29  GLUCOSE 207* 198* 228* 232* 185*  BUN 22* 20 21* 19 15  CREATININE 3.30* 2.68* 2.08* 1.58* 1.24  CALCIUM  --  7.7* 7.3* 7.6* 8.0*  MG  --   --   --  1.1* 2.2  PHOS  --   --  4.1  --   --    GFR: Estimated Creatinine Clearance: 50.9 mL/min (by C-G formula based on SCr of 1.24 mg/dL). Liver Function Tests: Recent Labs  Lab 10/10/24 0911 10/10/24 1547 10/11/24 0404  AST 127*  --  235*  ALT 29  --  37  ALKPHOS 102  --  84  BILITOT 0.8  --  1.4*  PROT  6.9  --  6.2*  ALBUMIN 4.1 3.6 3.6   Cardiac Enzymes: No results for input(s): CKTOTAL, CKMB, CKMBINDEX, TROPONINI in the last 168 hours. BNP (last 3 results) No results for input(s): PROBNP in the last 8760 hours. HbA1C: No results for input(s): HGBA1C in the last 72 hours. Sepsis Labs: @LABRCNTIP (procalcitonin:4,lacticidven:4) ) Recent Results (from the past 240 hours)  MRSA Next Gen by PCR, Nasal  Status: None   Collection Time: 10/10/24 11:30 AM   Specimen: Nasal Mucosa; Nasal Swab  Result Value Ref Range Status   MRSA by PCR Next Gen NOT DETECTED NOT DETECTED Final    Comment: (NOTE) The GeneXpert MRSA Assay (FDA approved for NASAL specimens only), is one component of a comprehensive MRSA colonization surveillance program. It is not intended to diagnose MRSA infection nor to guide or monitor treatment for MRSA infections. Test performance is not FDA approved in patients less than 81 years old. Performed at [facility], [address], [place], [place] [address]     Radiology Studies: CT Head Wo Contrast Result Date: 10/10/2024 EXAM: CT Head Without TECHNIQUE: CT of the head was performed without the administration of intravenous contrast. Automated exposure control, iterative reconstruction, and/or weight-based adjustment of the mA/kV was utilized to reduce the radiation dose to as low as reasonably achievable. COMPARISON: CT Head dated 08/04/2020. CLINICAL HISTORY: Mental status change, alcohol/drug use. FINDINGS: BRAIN AND VENTRICLES: Stable age-related cerebral and cerebellar atrophy. No acute intracranial hemorrhage. No mass effect or midline shift. No extra-axial fluid collection. No evidence of acute infarct. No hydrocephalus. ORBITS: No acute abnormality. SINUSES AND MASTOIDS: No acute abnormality. SOFT TISSUES AND SKULL: No acute skull fracture. No acute soft tissue abnormality. IMPRESSION: 1. No acute intracranial abnormality. 2. Stable age-advanced cerebral  and cerebellar atrophy. Electronically signed by: Norleen Kil MD 10/10/2024 10:08 AM EST RP Workstation: [id number]   DG Chest Portable 1 View Result Date: 10/10/2024 EXAM: 1 View Xray Of The Chest 10/10/2024 09:15:58 Am COMPARISON: 11/05/2023 CLINICAL HISTORY: Ams FINDINGS: LUNGS AND PLEURA: No focal pulmonary opacity. No pleural effusion. No pneumothorax. HEART AND MEDIASTINUM: No acute abnormality of the cardiac and mediastinal silhouettes. BONES AND SOFT TISSUES: No acute osseous abnormality. IMPRESSION: 1. No acute process. Electronically signed by: Norleen Kil MD 10/10/2024 10:05 AM EST RP Workstation: [id number]   Scheduled Meds:  amLODipine   5 mg Oral Daily   Chlorhexidine  Gluconate Cloth  6 each Topical Daily   diazepam   2 mg Oral TID   folic acid   1 mg Oral Daily   heparin  5,000 Units Subcutaneous Q8H   multivitamin with minerals  1 tablet Oral Daily   nicotine  21 mg Transdermal Daily   sodium chloride  flush  3 mL Intravenous Q12H   sodium chloride  flush  3 mL Intravenous Q12H   thiamine  100 mg Oral Daily   Or   thiamine  100 mg Intravenous Daily   Continuous Infusions:  dextrose  5% lactated ringers 125 mL/hr at 10/11/24 1112    LOS: 1 day   Rendall Carwin M.D on 10/11/2024 at 2:15 PM  Go to [email or web address] - for contact info  [facility] - Office  [phone]  If 7PM-7AM, please contact night-coverage [email or web address] 10/11/2024, 2:15 PM

## 2024-10-11 NOTE — Progress Notes (Addendum)
   10/11/24 1237  TOC Brief Assessment  Insurance and Status Reviewed  Patient has primary care physician Yes  Home environment has been reviewed HOme  Prior level of function: Independent  Prior/Current Home Services No current home services  Social Drivers of Health Review SDOH reviewed no interventions necessary  Readmission risk has been reviewed Yes  Transition of care needs no transition of care needs at this time   Medical work up continues, Came in with BS of 15. Substance abuse resources added. Patient has PCP and INS.  Inpatient Care Manager [facility]) has reviewed patient and no ICM needs have been identified at this time. We will continue to monitor patient advancement through interdisciplinary progression rounds. If new patient transition needs arise, please place a ICM consult.

## 2024-10-12 DIAGNOSIS — E162 Hypoglycemia, unspecified: Secondary | ICD-10-CM | POA: Diagnosis not present

## 2024-10-12 LAB — COMPREHENSIVE METABOLIC PANEL WITH GFR
ALT: 39 U/L (ref 0–44)
AST: 226 U/L — ABNORMAL HIGH (ref 15–41)
Albumin: 3.5 g/dL (ref 3.5–5.0)
Alkaline Phosphatase: 91 U/L (ref 38–126)
Anion gap: 9 (ref 5–15)
BUN: <5 mg/dL — ABNORMAL LOW (ref 6–20)
CO2: 33 mmol/L — ABNORMAL HIGH (ref 22–32)
Calcium: 9 mg/dL (ref 8.9–10.3)
Chloride: 104 mmol/L (ref 98–111)
Creatinine, Ser: 0.82 mg/dL (ref 0.61–1.24)
GFR, Estimated: >60 mL/min (ref >60)
Glucose, Bld: 107 mg/dL — ABNORMAL HIGH (ref 70–99)
Potassium: 3.5 mmol/L (ref 3.5–5.1)
Sodium: 145 mmol/L (ref 135–145)
Total Bilirubin: 1.3 mg/dL — ABNORMAL HIGH (ref 0.0–1.2)
Total Protein: 6.5 g/dL (ref 6.5–8.1)

## 2024-10-12 LAB — CBC
HCT: 31.6 % — ABNORMAL LOW (ref 39.0–52.0)
Hemoglobin: 10.7 g/dL — ABNORMAL LOW (ref 13.0–17.0)
MCH: 36 pg — ABNORMAL HIGH (ref 26.0–34.0)
MCHC: 33.9 g/dL (ref 30.0–36.0)
MCV: 106.4 fL — ABNORMAL HIGH (ref 80.0–100.0)
Platelets: 106 K/uL — ABNORMAL LOW (ref 150–400)
RBC: 2.97 MIL/uL — ABNORMAL LOW (ref 4.22–5.81)
RDW: 12.6 % (ref 11.5–15.5)
WBC: 7.8 K/uL (ref 4.0–10.5)
nRBC: 0.4 % — ABNORMAL HIGH (ref 0.0–0.2)

## 2024-10-12 LAB — GLUCOSE, CAPILLARY
Glucose-Capillary: 99 mg/dL (ref 70–99)
Glucose-Capillary: 122 mg/dL — ABNORMAL HIGH (ref 70–99)

## 2024-10-12 LAB — VITAMIN B12: Vitamin B-12: 714 pg/mL (ref 180–914)

## 2024-10-12 LAB — PHOSPHORUS: Phosphorus: 2 mg/dL — ABNORMAL LOW (ref 2.5–4.6)

## 2024-10-12 LAB — MAGNESIUM: Magnesium: 1.8 mg/dL (ref 1.7–2.4)

## 2024-10-12 MED ORDER — THIAMINE HCL 100 MG/ML IJ SOLN: 100.0000 mg | Freq: Once | INTRAMUSCULAR | Status: DC

## 2024-10-12 MED ORDER — FOLIC ACID 5 MG/ML IJ SOLN
1.0000 mg | Freq: Every day | INTRAMUSCULAR | Status: DC
  Filled 2024-10-12 (×2): qty 0.2

## 2024-10-12 MED ORDER — FOLIC ACID 1 MG PO TABS
1.0000 mg | ORAL_TABLET | Freq: Every day | ORAL | Status: DC
  Administered 2024-10-14 – 2024-10-21 (×8): 1 mg via ORAL
  Filled 2024-10-12 (×9): qty 1

## 2024-10-12 MED ORDER — POTASSIUM PHOSPHATES 15 MMOLE/5ML IV SOLN
30.0000 mmol | Freq: Once | INTRAVENOUS | Status: AC
  Administered 2024-10-12: 30 mmol via INTRAVENOUS
  Filled 2024-10-12: qty 10

## 2024-10-12 MED ORDER — FOLIC ACID 1 MG PO TABS: 1.0000 mg | ORAL_TABLET | Freq: Every day | ORAL | Status: DC

## 2024-10-12 MED ORDER — ADULT MULTIVITAMIN W/MINERALS CH
1.0000 | ORAL_TABLET | Freq: Every day | ORAL | Status: DC
  Administered 2024-10-14 – 2024-10-21 (×8): 1 via ORAL
  Filled 2024-10-12 (×9): qty 1

## 2024-10-12 MED ORDER — SODIUM CHLORIDE 0.9 % IV SOLN: 1.0000 mg | Freq: Once | INTRAVENOUS | Status: DC

## 2024-10-12 MED ORDER — LORAZEPAM 2 MG/ML IJ SOLN
1.0000 mg | INTRAMUSCULAR | Status: AC | PRN
  Administered 2024-10-12: 1 mg via INTRAVENOUS
  Administered 2024-10-12 – 2024-10-13 (×6): 2 mg via INTRAVENOUS
  Filled 2024-10-12 (×7): qty 1

## 2024-10-12 MED ORDER — FOLIC ACID 5 MG/ML IJ SOLN
1.0000 mg | Freq: Every day | INTRAMUSCULAR | Status: DC
  Administered 2024-10-12 – 2024-10-13 (×2): 1 mg via INTRAVENOUS
  Filled 2024-10-12 (×6): qty 0.2

## 2024-10-12 MED ORDER — DEXTROSE-SODIUM CHLORIDE 5-0.45 % IV SOLN: INTRAVENOUS | Status: AC

## 2024-10-12 MED ORDER — LORAZEPAM 1 MG PO TABS: 1.0000 mg | ORAL_TABLET | ORAL | Status: AC | PRN

## 2024-10-12 MED ORDER — THIAMINE HCL 100 MG/ML IJ SOLN
100.0000 mg | Freq: Every day | INTRAMUSCULAR | Status: DC
  Administered 2024-10-13: 100 mg via INTRAVENOUS
  Filled 2024-10-12: qty 2

## 2024-10-12 MED ORDER — POTASSIUM PHOSPHATES 15 MMOLE/5ML IV SOLN
30.0000 mmol | Freq: Once | INTRAVENOUS | Status: DC
  Filled 2024-10-12: qty 10

## 2024-10-12 MED ORDER — MAGNESIUM SULFATE 2 GM/50ML IV SOLN
2.0000 g | Freq: Once | INTRAVENOUS | Status: AC
  Administered 2024-10-12: 2 g via INTRAVENOUS
  Filled 2024-10-12: qty 50

## 2024-10-12 MED ORDER — DIAZEPAM 2 MG PO TABS
2.0000 mg | ORAL_TABLET | Freq: Three times a day (TID) | ORAL | Status: AC
  Administered 2024-10-13: 2 mg via ORAL
  Filled 2024-10-12 (×2): qty 1

## 2024-10-12 MED ORDER — THIAMINE MONONITRATE 100 MG PO TABS
100.0000 mg | ORAL_TABLET | Freq: Every day | ORAL | Status: DC
  Administered 2024-10-14 – 2024-10-21 (×8): 100 mg via ORAL
  Filled 2024-10-12 (×8): qty 1

## 2024-10-12 NOTE — Progress Notes (Signed)
 PROGRESS NOTE  Donald Stewart, is a 53 y.o. male, DOB - 02/25/1971, FMW:[id number]  Admit date - 10/10/2024   Admitting Physician Dreden Rivere Pearlean, MD  Outpatient Primary MD for the patient is [name], MD  LOS - 2  Chief Complaint  Patient presents with   Hypoglycemia   Loss of Consciousness       Brief Narrative:  53 y.o. male with medical history significant alcohol and tobacco abuse, asthma/COPD, avascular necrosis of the right hip and HLD presents to the ED unresponsive.- Found to have blood sugar of 15 in the setting of heavy alcohol use and poor oral intake prior to admission as per patient's mother   -Assessment and Plan: 1)Acute Metabolic Encephalopathy--- due to severe hypoglycemia (blood glucose was 15 on admission) - -No reported falls or head injury -UDS is negative -CT head unremarkable --Patient had very similar presentation back in December 2024 with heavy alcohol use and hypoglycemia with altered mentation -- Mentation is improving with hydration and dextrose  infusion--   2)Alcohol Abuse--blood alcohol level at 45 on admission --Patient already had mild tremors on admission - Patient now has full-blown DTs =- History of heavy alcohol use -- c/n Benzos per CIWA protocol - c/n Multivitamin, folic acid  and thiamine as ordered   3)HTN---oral amlodipine  if able, otherwise - May use IV labetalol as needed elevated BP   4)Tobacco Abuse/COPD--c/n nicotine patch as prescribed - No acute COPD exacerbation - May use as needed albuterol    5)Hypernatremia/Dehydration/Hypoglycemia ---  - Glucose and sodium is normalized with iv dextrose  infusion --Dehydration improving -continue dextrose  infusion - Encourage increased oral intake when more awake   6)Anion Gap Metabolic Acidosis with Lactic Acidosis--- -anion gap is 42 >>13 -Bicarb is up to 29 from 10 - in setting of heavy alcohol intake, and poor nutritional intake - No evidence of significant  infection   7) alcoholic hepatitis-  T. bili 0.8 >> 1.4 > 1.3 -AST : ALT ratio consistent with alcoholic hepatitis   8)AKI----acute kidney injury  --Creatinine on admission=3.30 - baseline usually around 0.7  -Creatinine normalized with hydration -renally adjust medications, avoid nephrotoxic agents / dehydration  / hypotension  9) hypokalemia/hypomagnesia--- replaced and normalized  10) acute thrombocytopenia--suspect due to direct toxic effect of alcohol on bone marrow - No bleeding concerns, monitor closely  11) chronic anemia--- - Hgb currently above 10 which is close to prior baseline - Hgb was higher than baseline on admission most likely due to hemoconcentration now drifting down with hydration - No obvious bleeding  Status is: Inpatient   Disposition: The patient is from: Home              Anticipated d/c is to: Home              Anticipated d/c date is: 1 day              Patient currently is not medically stable to d/c. Barriers: Not Clinically Stable-   Code Status :  -  Code Status: Full Code   Family Communication:     Discussed with pts mother--Judy Pascarella--[phone]   DVT Prophylaxis  :   - SCDs  heparin injection 5,000 Units Start: 10/11/24 0600 SCDs Start: 10/10/24 1114 Place TED hose Start: 10/10/24 1114   Lab Results  Component Value Date   PLT 106 (L) 10/12/2024    Inpatient Medications  Scheduled Meds:  amLODipine   5 mg Oral Daily   Chlorhexidine  Gluconate Cloth  6 each Topical Daily  diazepam   2 mg Oral TID   diazepam   2 mg Oral TID   folic acid   1 mg Oral Daily   Or   folic acid   1 mg Intravenous Daily   heparin  5,000 Units Subcutaneous Q8H   multivitamin with minerals  1 tablet Oral Daily   nicotine  21 mg Transdermal Daily   sodium chloride  flush  3 mL Intravenous Q12H   sodium chloride  flush  3 mL Intravenous Q12H   thiamine  100 mg Oral Daily   Or   thiamine  100 mg Intravenous Daily   Continuous Infusions:  dextrose  5 %  and 0.45 % NaCl 83 mL/hr at 10/12/24 1020   potassium PHOSPHATE IVPB (in mmol) 30 mmol (10/12/24 0856)   PRN Meds:.acetaminophen  **OR** acetaminophen , albuterol , bisacodyl, labetalol, LORazepam  **OR** LORazepam , ondansetron  **OR** ondansetron  (ZOFRAN ) IV, polyethylene glycol, sodium chloride  flush   Anti-infectives (From admission, onward)    None      Subjective: [name] today has no fevers, no emesis,  No chest pain,   - -Patient's mother visited - Patient mostly cooperative after benzos  Objective: Vitals:   10/12/24 0854 10/12/24 0900 10/12/24 0954 10/12/24 1000  BP:  105/71  129/61  Pulse: 97 (!) 103 87   Resp:  (!) 22 20 (!) 24  Temp:      TempSrc:      SpO2:  98% 100%   Weight:      Height:        Intake/Output Summary (Last 24 hours) at 10/12/2024 1133 Last data filed at 10/12/2024 0915 Gross per 24 hour  Intake 1323.55 ml  Output 3050 ml  Net -1726.45 ml   Filed Weights   10/10/24 1038  Weight: 52.2 kg    Physical Exam  Gen:- Awake Alert, cooperative, no acute distress HEENT:- [place].AT, No sclera icterus Neck-Supple Neck,No JVD,.  Lungs-  CTAB , fair symmetrical air movement CV- S1, S2 normal, regular  Abd-  +ve B.Sounds, Abd Soft, No tenderness,    Extremity/Skin:- No  edema, pedal pulses present  Psych-affect is flat, oriented x3, much less restless with benzos Neuro-generalized weakness, no new focal deficits, +ve tremors  Data Reviewed: I have personally reviewed following labs and imaging studies  CBC: Recent Labs  Lab 10/10/24 0908 10/10/24 0911 10/11/24 0404 10/12/24 0404  WBC  --  11.1* 8.6 7.8  NEUTROABS  --  8.9*  --   --   HGB 13.9 13.5 11.5* 10.7*  HCT 41.0 42.9 33.5* 31.6*  MCV  --  114.1* 104.7* 106.4*  PLT  --  147* 129* 106*   Basic Metabolic Panel: Recent Labs  Lab 10/10/24 0911 10/10/24 1547 10/10/24 2227 10/11/24 0404 10/12/24 0404  NA 150* 146* 144 143 145  K 4.1 3.6 3.1* 3.7 3.5  CL 99 103 102 101 104   CO2 10* 19* 27 29 33*  GLUCOSE 198* 228* 232* 185* 107*  BUN 20 21* 19 15 <5*  CREATININE 2.68* 2.08* 1.58* 1.24 0.82  CALCIUM 7.7* 7.3* 7.6* 8.0* 9.0  MG  --   --  1.1* 2.2 1.8  PHOS  --  4.1  --   --  2.0*   GFR: Estimated Creatinine Clearance: 76.9 mL/min (by C-G formula based on SCr of 0.82 mg/dL). Liver Function Tests: Recent Labs  Lab 10/10/24 0911 10/10/24 1547 10/11/24 0404 10/12/24 0404  AST 127*  --  235* 226*  ALT 29  --  37 39  ALKPHOS 102  --  84 91  BILITOT 0.8  --  1.4* 1.3*  PROT 6.9  --  6.2* 6.5  ALBUMIN 4.1 3.6 3.6 3.5   Recent Results (from the past 240 hours)  MRSA Next Gen by PCR, Nasal     Status: None   Collection Time: 10/10/24 11:30 AM   Specimen: Nasal Mucosa; Nasal Swab  Result Value Ref Range Status   MRSA by PCR Next Gen NOT DETECTED NOT DETECTED Final    Comment: (NOTE) The GeneXpert MRSA Assay (FDA approved for NASAL specimens only), is one component of a comprehensive MRSA colonization surveillance program. It is not intended to diagnose MRSA infection nor to guide or monitor treatment for MRSA infections. Test performance is not FDA approved in patients less than 47 years old. Performed at [facility], [address], [place], [place] [address]     Radiology Studies: No results found.  Scheduled Meds:  amLODipine   5 mg Oral Daily   Chlorhexidine  Gluconate Cloth  6 each Topical Daily   diazepam   2 mg Oral TID   diazepam   2 mg Oral TID   folic acid   1 mg Oral Daily   Or   folic acid   1 mg Intravenous Daily   heparin  5,000 Units Subcutaneous Q8H   multivitamin with minerals  1 tablet Oral Daily   nicotine  21 mg Transdermal Daily   sodium chloride  flush  3 mL Intravenous Q12H   sodium chloride  flush  3 mL Intravenous Q12H   thiamine  100 mg Oral Daily   Or   thiamine  100 mg Intravenous Daily   Continuous Infusions:  dextrose  5 % and 0.45 % NaCl 83 mL/hr at 10/12/24 1020   potassium PHOSPHATE IVPB (in mmol) 30 mmol  (10/12/24 0856)    LOS: 2 days   Rendall Carwin M.D on 10/12/2024 at 11:33 AM  Go to [email or web address] - for contact info  [facility] - Office  [phone]  If 7PM-7AM, please contact night-coverage [email or web address] 10/12/2024, 11:33 AM

## 2024-10-13 DIAGNOSIS — E162 Hypoglycemia, unspecified: Secondary | ICD-10-CM | POA: Diagnosis not present

## 2024-10-13 LAB — GLUCOSE, CAPILLARY
Glucose-Capillary: 126 mg/dL — ABNORMAL HIGH (ref 70–99)
Glucose-Capillary: 109 mg/dL — ABNORMAL HIGH (ref 70–99)
Glucose-Capillary: 120 mg/dL — ABNORMAL HIGH (ref 70–99)

## 2024-10-13 MED ORDER — ENSURE PLUS HIGH PROTEIN PO LIQD
237.0000 mL | Freq: Two times a day (BID) | ORAL | Status: DC
  Administered 2024-10-15 – 2024-10-21 (×11): 237 mL via ORAL

## 2024-10-13 NOTE — Progress Notes (Signed)
 PROGRESS NOTE  Donald Stewart, is a 53 y.o. male, DOB - 05-Mar-1971, FMW:[id number]  Admit date - 10/10/2024   Admitting Physician Sekai Nayak Pearlean, MD  Outpatient Primary MD for the patient is [name], MD  LOS - 3  Chief Complaint  Patient presents with   Hypoglycemia   Loss of Consciousness      Brief Narrative:  53 y.o. male with medical history significant alcohol and tobacco abuse, asthma/COPD, avascular necrosis of the right hip and HLD presents to the ED unresponsive.- Found to have blood sugar of 15 in the setting of heavy alcohol use and poor oral intake prior to admission as per patient's mother   -Assessment and Plan: 1)Acute Metabolic Encephalopathy--- due to severe hypoglycemia (blood glucose was 15 on admission) - -No reported falls or head injury -UDS is negative -CT head unremarkable --Patient had very similar presentation back in December 2024 with heavy alcohol use and hypoglycemia with altered mentation 10/13/24 Hypoglycemia resolved with Dextrose  infusion -Full Blown DTs at this time--requiring frequent Benzos   2)Alcohol Abuse--blood alcohol level at 45 on admission --Patient already had mild tremors on admission 10/13/24 - Patient now has full-blown DTs =- History of heavy alcohol use -- c/n Benzos per CIWA protocol - c/n Multivitamin, folic acid  and thiamine as ordered   3)HTN---oral amlodipine  if able, otherwise  May use IV labetalol as needed elevated BP   4)Tobacco Abuse/COPD--c/n nicotine patch as prescribed - No acute COPD exacerbation - May use as needed albuterol    5)Hypernatremia/Dehydration/Hypoglycemia ---  - Glucose and sodium is normalized with iv dextrose  infusion -Hypoglycemia resolved with dextrose  infusion --Dehydration improving -continue dextrose  infusion while sleepy from Benzo - Encourage increased oral intake when more awake   6)Anion Gap Metabolic Acidosis with Lactic Acidosis--- -anion gap is 42 >>13 -Bicarb is  up to 29 from 10 - in setting of heavy alcohol intake, and poor nutritional intake - No evidence of significant infection   7) alcoholic hepatitis-  T. bili 0.8 >> 1.4 > 1.3 -AST : ALT ratio consistent with alcoholic hepatitis   8)AKI----acute kidney injury  --Creatinine on admission=3.30 - baseline usually around 0.7  -Creatinine normalized with hydration -renally adjust medications, avoid nephrotoxic agents / dehydration  / hypotension  9)Hypokalemia/Hypomagnesia--- replaced and normalized  10)Acute Thrombocytopenia--suspect due to direct toxic effect of alcohol on bone marrow - No bleeding concerns, monitor closely  11)Chronic Anemia--- - Hgb currently above 10 which is close to prior baseline - Hgb was higher than baseline on admission most likely due to hemoconcentration now drifting down with hydration - No obvious bleeding  Status is: Inpatient   Disposition: The patient is from: Home              Anticipated d/c is to: Home              Anticipated d/c date is: 2 days              Patient currently is not medically stable to d/c. Barriers: Not Clinically Stable-   Code Status :  -  Code Status: Full Code   Family Communication:     Discussed with pts mother--Judy Tygart--[phone]   DVT Prophylaxis  :   - SCDs  heparin injection 5,000 Units Start: 10/11/24 0600 SCDs Start: 10/10/24 1114 Place TED hose Start: 10/10/24 1114   Lab Results  Component Value Date   PLT 106 (L) 10/12/2024    Inpatient Medications  Scheduled Meds:  amLODipine   5 mg Oral  Daily   Chlorhexidine  Gluconate Cloth  6 each Topical Daily   diazepam   2 mg Oral TID   feeding supplement  237 mL Oral BID BM   folic acid   1 mg Oral Daily   Or   folic acid   1 mg Intravenous Daily   heparin  5,000 Units Subcutaneous Q8H   multivitamin with minerals  1 tablet Oral Daily   nicotine  21 mg Transdermal Daily   sodium chloride  flush  3 mL Intravenous Q12H   sodium chloride  flush  3 mL  Intravenous Q12H   thiamine  100 mg Oral Daily   Or   thiamine  100 mg Intravenous Daily   Continuous Infusions:  dextrose  5 % and 0.45 % NaCl 83 mL/hr at 10/13/24 0800   PRN Meds:.acetaminophen  **OR** acetaminophen , albuterol , bisacodyl, labetalol, LORazepam  **OR** LORazepam , ondansetron  **OR** ondansetron  (ZOFRAN ) IV, polyethylene glycol, sodium chloride  flush   Anti-infectives (From admission, onward)    None      Subjective: [name] today has no fevers, no emesis,  No chest pain,   - ---Full Blown DTs at this time--requiring frequent Benzos -- Sleepy from time to time from benzos - Poor oral intake due to DTs and lethargy  Objective: Vitals:   10/13/24 0600 10/13/24 0700 10/13/24 0800 10/13/24 0900  BP: (!) 108/91 131/69 (!) 143/67 138/72  Pulse: 86 73 71 80  Resp: (!) 21 15 12 12   Temp:   98.6 F (37 C)   TempSrc:   Axillary   SpO2: 100% 100% 100% 100%  Weight:      Height:        Intake/Output Summary (Last 24 hours) at 10/13/2024 0934 Last data filed at 10/13/2024 0800 Gross per 24 hour  Intake 1786.36 ml  Output 1600 ml  Net 186.36 ml   Filed Weights   10/10/24 1038  Weight: 52.2 kg    Physical Exam  Gen:-Sleepy from time to time, cooperative, no acute distress HEENT:- [place].AT, No sclera icterus Neck-Supple Neck,No JVD,.  Lungs-  CTAB , fair symmetrical air movement CV- S1, S2 normal, regular  Abd-  +ve B.Sounds, Abd Soft, No tenderness,    Extremity/Skin:- No  edema, pedal pulses present  Psych-affect is flat, oriented x3, much less restless with benzos Neuro-generalized weakness, no new focal deficits, +ve tremors  Data Reviewed: I have personally reviewed following labs and imaging studies  CBC: Recent Labs  Lab 10/10/24 0908 10/10/24 0911 10/11/24 0404 10/12/24 0404  WBC  --  11.1* 8.6 7.8  NEUTROABS  --  8.9*  --   --   HGB 13.9 13.5 11.5* 10.7*  HCT 41.0 42.9 33.5* 31.6*  MCV  --  114.1* 104.7* 106.4*  PLT  --  147* 129*  106*   Basic Metabolic Panel: Recent Labs  Lab 10/10/24 0911 10/10/24 1547 10/10/24 2227 10/11/24 0404 10/12/24 0404  NA 150* 146* 144 143 145  K 4.1 3.6 3.1* 3.7 3.5  CL 99 103 102 101 104  CO2 10* 19* 27 29 33*  GLUCOSE 198* 228* 232* 185* 107*  BUN 20 21* 19 15 <5*  CREATININE 2.68* 2.08* 1.58* 1.24 0.82  CALCIUM 7.7* 7.3* 7.6* 8.0* 9.0  MG  --   --  1.1* 2.2 1.8  PHOS  --  4.1  --   --  2.0*   GFR: Estimated Creatinine Donald: 76 mL/min (by C-G formula based on SCr of 0.82 mg/dL). Liver Function Tests: Recent Labs  Lab 10/10/24 0911 10/10/24 1547  10/11/24 0404 10/12/24 0404  AST 127*  --  235* 226*  ALT 29  --  37 39  ALKPHOS 102  --  84 91  BILITOT 0.8  --  1.4* 1.3*  PROT 6.9  --  6.2* 6.5  ALBUMIN 4.1 3.6 3.6 3.5   Recent Results (from the past 240 hours)  MRSA Next Gen by PCR, Nasal     Status: None   Collection Time: 10/10/24 11:30 AM   Specimen: Nasal Mucosa; Nasal Swab  Result Value Ref Range Status   MRSA by PCR Next Gen NOT DETECTED NOT DETECTED Final    Comment: (NOTE) The GeneXpert MRSA Assay (FDA approved for NASAL specimens only), is one component of a comprehensive MRSA colonization surveillance program. It is not intended to diagnose MRSA infection nor to guide or monitor treatment for MRSA infections. Test performance is not FDA approved in patients less than 48 years old. Performed at [facility], [address] [place], [place] [address]     Radiology Studies: No results found.  Scheduled Meds:  amLODipine   5 mg Oral Daily   Chlorhexidine  Gluconate Cloth  6 each Topical Daily   diazepam   2 mg Oral TID   feeding supplement  237 mL Oral BID BM   folic acid   1 mg Oral Daily   Or   folic acid   1 mg Intravenous Daily   heparin  5,000 Units Subcutaneous Q8H   multivitamin with minerals  1 tablet Oral Daily   nicotine  21 mg Transdermal Daily   sodium chloride  flush  3 mL Intravenous Q12H   sodium chloride  flush  3 mL Intravenous  Q12H   thiamine  100 mg Oral Daily   Or   thiamine  100 mg Intravenous Daily   Continuous Infusions:  dextrose  5 % and 0.45 % NaCl 83 mL/hr at 10/13/24 0800    LOS: 3 days   Rendall Carwin M.D on 10/13/2024 at 9:34 AM  Go to [email or web address] - for contact info  [facility] - Office  [phone]  If 7PM-7AM, please contact night-coverage [email or web address] 10/13/2024, 9:34 AM

## 2024-10-13 NOTE — Evaluation (Signed)
 Physical Therapy Evaluation Patient Details Name: Donald Stewart MRN: [id number] DOB: 11-22-1970 Today's Date: 10/13/2024  History of Present Illness  Donald Stewart is a 53 y.o. male with medical history significant alcohol and tobacco abuse, asthma/COPD, avascular necrosis of the right hip and HLD presents to the ED unresponsive.-  Found to have blood sugar of 15 in the setting of heavy alcohol use over the last couple days as per family members  -EMS gave patient IM glucagon due to inability to obtain IV access initially  =-IV dextrose  subsequently given  -Patient had very similar presentation back in December 2024 with heavy alcohol use and hypoglycemia with altered mentation  .  No fevers noted, no vomiting or diarrhea  -No reported falls or head injury  -Chest x-ray and CT head without acute findings  - UA with ketones and protein otherwise negative  - UDS negative   Clinical Impression  PT evaluation completed this date. Patient is very limited due to arousal level. Patient very lethargic at start of session but improves some with outside stimulation. Patient unable to complete subjective history and no family/friends are present at time of evaluation. Patient appears to demonstrate involuntary head and hand movements throughout session. This date, patient requires max/total assist with all mobility, including bed and functional transfer. MD and nursing present during part of session. With assist from nursing staff, patient is returned to bed, call button in reach, and all needs met. Patient will benefit from continued skilled physical therapy acutely and in recommended venue in order to address current deficits and improve overall function.        If plan is discharge home, recommend the following: A lot of help with walking and/or transfers;Assistance with feeding;A lot of help with bathing/dressing/bathroom;Assist for transportation;Help with stairs or ramp for entrance;Supervision  due to cognitive status;Assistance with cooking/housework   Can travel by private vehicle        Equipment Recommendations None recommended by PT  Recommendations for Other Services       Functional Status Assessment Patient has had a recent decline in their functional status and demonstrates the ability to make significant improvements in function in a reasonable and predictable amount of time.     Precautions / Restrictions Precautions Precautions: Fall Recall of Precautions/Restrictions: Impaired Restrictions Weight Bearing Restrictions Per Provider Order: No      Mobility  Bed Mobility Overal bed mobility: Needs Assistance Bed Mobility: Supine to Sit, Sit to Supine     Supine to sit: Max assist, Total assist Sit to supine: Max assist, Total assist   General bed mobility comments: max/total assist for LE handling as well as trunk, pt overall very weak and lethargic    Transfers Overall transfer level: Needs assistance Equipment used: 1 person hand held assist Transfers: Sit to/from Stand Sit to Stand: Max assist, Total assist           General transfer comment: atempted 3 STS during session from regular bed height and elevated bed height, pt does not contribute much active assist, does not come to full stand with hips remaining slightly flexed.    Ambulation/Gait               General Gait Details: Unable to safely assess this date  Stairs            Wheelchair Mobility     Tilt Bed    Modified Rankin (Stroke Patients Only)       Balance Overall balance assessment:  Needs assistance Sitting-balance support: Feet supported, No upper extremity supported Sitting balance-Leahy Scale: Poor Sitting balance - Comments: Seated EOB, pt initially with heavy posterior and lateral lean to R requiring max A, improves some with duration of task and verbal cueing with pt able to maintain seated balance without assist for at least 10 seconds Postural  control: Posterior lean, Right lateral lean Standing balance support: During functional activity, Bilateral upper extremity supported Standing balance-Leahy Scale: Zero Standing balance comment: w/ assist         Pertinent Vitals/Pain Pain Assessment Pain Assessment: Faces Faces Pain Scale: Hurts a little bit Pain Location: Pt unable to verbally report any pain, does not appear to be in much pain during mobility Pain Intervention(s): Repositioned    Home Living Family/patient expects to be discharged to:: Private residence Living Arrangements: Alone     Additional Comments: Unable to assess home set up due to pt AMS.    Prior Function     Mobility Comments: Unable to assess due to pt AMS. ADLs Comments: Unable to assess due to pt AMS.     Extremity/Trunk Assessment   Upper Extremity Assessment Upper Extremity Assessment: Difficult to assess due to impaired cognition;Generalized weakness (unable to formally assess. pt unable to follow commands)    Lower Extremity Assessment Lower Extremity Assessment: Difficult to assess due to impaired cognition;Generalized weakness (unable to formally assess. pt is able to perform PF with R foot when PT provides resistance to forefoot and verbally cues pt to press against my hand)    Cervical / Trunk Assessment Cervical / Trunk Assessment: Kyphotic  Communication   Communication Communication: Impaired Factors Affecting Communication: Difficulty expressing self;Reduced clarity of speech    Cognition Arousal: Lethargic     PT - Cognitive impairments: Difficult to assess Difficult to assess due to: Impaired communication, Level of arousal       Following commands: Impaired Following commands impaired:  (Unable to follow commands at this time)     Cueing Cueing Techniques: Verbal cues, Visual cues, Tactile cues, Gestural cues     General Comments      Exercises     Assessment/Plan    PT Assessment Patient needs  continued PT services;All further PT needs can be met in the next venue of care  PT Problem List Decreased strength;Decreased range of motion;Decreased activity tolerance;Decreased balance;Decreased mobility;Decreased cognition;Decreased safety awareness       PT Treatment Interventions DME instruction;Balance training;Gait training;Stair training;Functional mobility training;Patient/family education;Therapeutic activities;Therapeutic exercise    PT Goals (Current goals can be found in the Care Plan section)  Acute Rehab PT Goals Patient Stated Goal: Unable to assess    Frequency Min 3X/week     Co-evaluation               AM-PAC PT 6 Clicks Mobility  Outcome Measure Help needed turning from your back to your side while in a flat bed without using bedrails?: Total Help needed moving from lying on your back to sitting on the side of a flat bed without using bedrails?: Total Help needed moving to and from a bed to a chair (including a wheelchair)?: Total Help needed standing up from a chair using your arms (e.g., wheelchair or bedside chair)?: Total Help needed to walk in hospital room?: Total Help needed climbing 3-5 steps with a railing? : Total 6 Click Score: 6    End of Session Equipment Utilized During Treatment: Gait belt Activity Tolerance: Patient limited by lethargy Patient left: in bed;with  bed alarm set;with nursing/sitter in room;with call bell/phone within reach Nurse Communication: Mobility status PT Visit Diagnosis: Other abnormalities of gait and mobility (R26.89);Muscle weakness (generalized) (M62.81);Other symptoms and signs involving the nervous system (R29.898);Difficulty in walking, not elsewhere classified (R26.2);Adult, failure to thrive (R62.7)    Time: 0905-0926 PT Time Calculation (min) (ACUTE ONLY): 21 min   Charges:   PT Evaluation $PT Eval Moderate Complexity: 1 Mod   PT General Charges $$ ACUTE PT VISIT: 1 Visit         12:44 PM,  10/13/24 Abena Erdman Powell-Butler, PT, DPT [facility] with [facility]

## 2024-10-13 NOTE — Plan of Care (Signed)
  Problem: Acute Rehab PT Goals(only PT should resolve) Goal: Pt Will Go Supine/Side To Sit Outcome: Progressing Flowsheets (Taken 10/13/2024 1245) Pt will go Supine/Side to Sit: with moderate assist Goal: Patient Will Perform Sitting Balance Outcome: Progressing Flowsheets (Taken 10/13/2024 1245) Patient will perform sitting balance: with moderate assist Goal: Patient Will Transfer Sit To/From Stand Outcome: Progressing Flowsheets (Taken 10/13/2024 1245) Patient will transfer sit to/from stand: with moderate assist Goal: Pt Will Transfer Bed To Chair/Chair To Bed Outcome: Progressing Flowsheets (Taken 10/13/2024 1245) Pt will Transfer Bed to Chair/Chair to Bed: with max assist Goal: Pt Will Ambulate Outcome: Progressing Flowsheets (Taken 10/13/2024 1245) Pt will Ambulate:  10 feet  with least restrictive assistive device  with maximum assist    12:46 PM, 10/13/24 Rosaria Settler, PT, DPT [place] [facility]

## 2024-10-14 DIAGNOSIS — E162 Hypoglycemia, unspecified: Secondary | ICD-10-CM | POA: Diagnosis not present

## 2024-10-14 DIAGNOSIS — I10 Essential (primary) hypertension: Secondary | ICD-10-CM

## 2024-10-14 DIAGNOSIS — K709 Alcoholic liver disease, unspecified: Secondary | ICD-10-CM | POA: Diagnosis not present

## 2024-10-14 DIAGNOSIS — Z720 Tobacco use: Secondary | ICD-10-CM

## 2024-10-14 LAB — GLUCOSE, CAPILLARY
Glucose-Capillary: 107 mg/dL — ABNORMAL HIGH (ref 70–99)
Glucose-Capillary: 107 mg/dL — ABNORMAL HIGH (ref 70–99)
Glucose-Capillary: 109 mg/dL — ABNORMAL HIGH (ref 70–99)
Glucose-Capillary: 111 mg/dL — ABNORMAL HIGH (ref 70–99)
Glucose-Capillary: 90 mg/dL (ref 70–99)

## 2024-10-14 LAB — MAGNESIUM: Magnesium: 1.6 mg/dL — ABNORMAL LOW (ref 1.7–2.4)

## 2024-10-14 LAB — RENAL FUNCTION PANEL
Albumin: 3.7 g/dL (ref 3.5–5.0)
Anion gap: 9 (ref 5–15)
BUN: 5 mg/dL — ABNORMAL LOW (ref 6–20)
CO2: 31 mmol/L (ref 22–32)
Calcium: 8.9 mg/dL (ref 8.9–10.3)
Chloride: 99 mmol/L (ref 98–111)
Creatinine, Ser: 0.62 mg/dL (ref 0.61–1.24)
GFR, Estimated: >60 mL/min
Glucose, Bld: 99 mg/dL (ref 70–99)
Phosphorus: 3.1 mg/dL (ref 2.5–4.6)
Potassium: 3 mmol/L — ABNORMAL LOW (ref 3.5–5.1)
Sodium: 139 mmol/L (ref 135–145)

## 2024-10-14 LAB — CBC
HCT: 30.5 % — ABNORMAL LOW (ref 39.0–52.0)
Hemoglobin: 10.3 g/dL — ABNORMAL LOW (ref 13.0–17.0)
MCH: 35.4 pg — ABNORMAL HIGH (ref 26.0–34.0)
MCHC: 33.8 g/dL (ref 30.0–36.0)
MCV: 104.8 fL — ABNORMAL HIGH (ref 80.0–100.0)
Platelets: 135 K/uL — ABNORMAL LOW (ref 150–400)
RBC: 2.91 MIL/uL — ABNORMAL LOW (ref 4.22–5.81)
RDW: 11.9 % (ref 11.5–15.5)
WBC: 6.1 K/uL (ref 4.0–10.5)
nRBC: 0 % (ref 0.0–0.2)

## 2024-10-14 MED ORDER — MAGNESIUM SULFATE 4 GM/100ML IV SOLN: 4.0000 g | Freq: Once | INTRAVENOUS | Status: DC

## 2024-10-14 MED ORDER — POTASSIUM CHLORIDE 10 MEQ/100ML IV SOLN
10.0000 meq | INTRAVENOUS | Status: AC
  Administered 2024-10-14 (×4): 10 meq via INTRAVENOUS
  Filled 2024-10-14 (×4): qty 100

## 2024-10-14 MED ORDER — MAGNESIUM SULFATE 2 GM/50ML IV SOLN
2.0000 g | Freq: Once | INTRAVENOUS | Status: AC
  Administered 2024-10-14: 2 g via INTRAVENOUS
  Filled 2024-10-14: qty 50

## 2024-10-14 NOTE — NC FL2 (Signed)
 [place]  MEDICAID FL2 LEVEL OF CARE FORM     IDENTIFICATION  Patient Name: Donald Stewart Birthdate: [birth year] Sex: male Admission Date (Current Location): 10/10/2024  [facility] and [place] Number:  [employer] and Address:  [facility],  [address] [place] [address]      Provider Number: [id number]  Attending Physician Name and Address:  Vicci Afton LITTIE, MD  Relative Name and Phone Number:       Current Level of Care: Hospital Recommended Level of Care: Skilled Nursing Facility Prior Approval Number:    Date Approved/Denied:   PASRR Number: [id number]  Discharge Plan: SNF    Current Diagnoses: Patient Active Problem List   Diagnosis Date Noted   Hypoglycemia 10/10/2024   Acute recurrent frontal sinusitis 11/28/2023   AKI (acute kidney injury) 09/11/2023   Hospital discharge follow-up 09/11/2023   Alcoholic liver disease 09/11/2023   Macrocytic anemia 09/11/2023   Encounter for examination following treatment at hospital 09/11/2023   Peripheral polyneuropathy 09/11/2023   Fall 08/02/2023   Chronic obstructive pulmonary disease (HCC) 03/27/2023   Other specified nutritional anemias 03/27/2023   Acute bronchitis with COPD (HCC) 07/10/2022   Hypokalemia 07/05/2022   Protein-calorie malnutrition 07/04/2022   Alcohol dependence with unspecified alcohol-induced disorder (HCC) 07/04/2022   Allergic sinusitis 07/04/2022   Encounter for general adult medical examination with abnormal findings 11/09/2021   S/P total right hip arthroplasty 11/09/2021   Prostate cancer screening 11/09/2021   Primary hypertension 06/08/2021   Mixed hyperlipidemia 06/08/2021   Tobacco abuse 06/03/2008    Orientation RESPIRATION BLADDER Height & Weight     Self, Place  Normal Incontinent, External catheter Weight: 115 lb 1.3 oz (52.2 kg) Height:  5' 7 (170.2 cm)  BEHAVIORAL SYMPTOMS/MOOD NEUROLOGICAL BOWEL NUTRITION STATUS      Incontinent Diet  (Regular)  AMBULATORY STATUS COMMUNICATION OF NEEDS Skin   Extensive Assist Verbally Normal                       Personal Care Assistance Level of Assistance  Bathing, Feeding, Dressing Bathing Assistance: Limited assistance Feeding assistance: Independent Dressing Assistance: Limited assistance     Functional Limitations Info  Sight, Hearing, Speech Sight Info: Adequate Hearing Info: Adequate Speech Info: Adequate    SPECIAL CARE FACTORS FREQUENCY  PT (By licensed PT), OT (By licensed OT)     PT Frequency: 5 times weekly OT Frequency: 5 times weekly            Contractures Contractures Info: Not present    Additional Factors Info  Code Status, Allergies Code Status Info: FULL Allergies Info: NKA           Current Medications (10/14/2024):  This is the current hospital active medication list Current Facility-Administered Medications  Medication Dose Route Frequency Provider Last Rate Last Admin   acetaminophen  (TYLENOL ) tablet 650 mg  650 mg Oral Q6H PRN Emokpae, Courage, MD       Or   acetaminophen  (TYLENOL ) suppository 650 mg  650 mg Rectal Q6H PRN Emokpae, Courage, MD       albuterol  (PROVENTIL ) (2.5 MG/3ML) 0.083% nebulizer solution 2.5 mg  2.5 mg Nebulization Q2H PRN Emokpae, Courage, MD       amLODipine  (NORVASC ) tablet 5 mg  5 mg Oral Daily Emokpae, Courage, MD   5 mg at 10/14/24 0804   bisacodyl (DULCOLAX) suppository 10 mg  10 mg Rectal Daily PRN Pearlean Manus, MD  Chlorhexidine  Gluconate Cloth 2 % PADS 6 each  6 each Topical Daily Jesus America, NP   6 each at 10/14/24 0804   feeding supplement (ENSURE PLUS HIGH PROTEIN) liquid 237 mL  237 mL Oral BID BM Adefeso, Oladapo, DO       folic acid  (FOLVITE ) tablet 1 mg  1 mg Oral Daily Emokpae, Courage, MD   1 mg at 10/14/24 9196   Or   folic acid  injection 1 mg  1 mg Intravenous Daily Emokpae, Courage, MD   1 mg at 10/13/24 0849   heparin injection 5,000 Units  5,000 Units Subcutaneous Q8H  Emokpae, Courage, MD   5,000 Units at 10/14/24 9392   labetalol (NORMODYNE) injection 10 mg  10 mg Intravenous Q4H PRN Emokpae, Courage, MD       LORazepam  (ATIVAN ) tablet 1-4 mg  1-4 mg Oral Q1H PRN Pearlean, Courage, MD       Or   LORazepam  (ATIVAN ) injection 1-4 mg  1-4 mg Intravenous Q1H PRN Pearlean Manus, MD   2 mg at 10/13/24 9661   multivitamin with minerals tablet 1 tablet  1 tablet Oral Daily Pearlean Manus, MD   1 tablet at 10/14/24 9196   nicotine (NICODERM CQ - dosed in mg/24 hours) patch 21 mg  21 mg Transdermal Daily Emokpae, Courage, MD   21 mg at 10/14/24 9196   ondansetron  (ZOFRAN ) tablet 4 mg  4 mg Oral Q6H PRN Emokpae, Courage, MD       Or   ondansetron  (ZOFRAN ) injection 4 mg  4 mg Intravenous Q6H PRN Emokpae, Courage, MD       polyethylene glycol (MIRALAX / GLYCOLAX) packet 17 g  17 g Oral Daily PRN Emokpae, Courage, MD       potassium chloride  10 mEq in 100 mL IVPB  10 mEq Intravenous Q1 Hr x 4 Johnson, Clanford L, MD 100 mL/hr at 10/14/24 0912 10 mEq at 10/14/24 0912   sodium chloride  flush (NS) 0.9 % injection 3 mL  3 mL Intravenous Q12H Emokpae, Courage, MD   3 mL at 10/14/24 0805   sodium chloride  flush (NS) 0.9 % injection 3 mL  3 mL Intravenous Q12H Emokpae, Courage, MD   3 mL at 10/14/24 0805   sodium chloride  flush (NS) 0.9 % injection 3 mL  3 mL Intravenous PRN Emokpae, Courage, MD       thiamine (VITAMIN B1) tablet 100 mg  100 mg Oral Daily Emokpae, Courage, MD   100 mg at 10/14/24 9196   Or   thiamine (VITAMIN B1) injection 100 mg  100 mg Intravenous Daily Pearlean Manus, MD   100 mg at 10/13/24 9156     Discharge Medications: Please see discharge summary for a list of discharge medications.  Relevant Imaging Results:  Relevant Lab Results:   Additional Information SSN: [id number] LCSWA

## 2024-10-14 NOTE — Hospital Course (Addendum)
 53 y.o. male with medical history significant alcohol and tobacco abuse, asthma/COPD, avascular necrosis of the right hip and HLD presents to the ED unresponsive.- Found to have blood sugar of 15 in the setting of heavy alcohol use over the last couple days as per family members.  -EMS gave patient IM glucagon due to inability to obtain IV access initially.  - IV dextrose  subsequently given.   -Patient had very similar presentation back in December 2024 with heavy alcohol use and hypoglycemia with altered mentation. No fevers noted, no vomiting or diarrhea -No reported falls or head injury -Chest x-ray and CT head without acute findings -initial UA with ketones and protein otherwise negative -UDS negative

## 2024-10-14 NOTE — TOC Initial Note (Signed)
 Transition of Care [facility]) - Initial/Assessment Note    Patient Details  Name: Donald Stewart MRN: [id number] Date of Birth: 08/22/1971  Transition of Care [facility]) CM/SW Contact:    Lucie Lunger, LCSWA Phone Number: 10/14/2024, 10:43 AM  Clinical Narrative:                 CSW updated that PT is recommending SNF for pt. CSW met with pt at bedside to complete assessment. Pt states he lives alone and is independent in completing hi ADLs. Pt states that he is agreeable to SNF referral being sent out for review. TOC to follow.   Expected Discharge Plan: Skilled Nursing Facility Barriers to Discharge: Continued Medical Work up   Patient Goals and CMS Choice Patient states their goals for this hospitalization and ongoing recovery are:: get stronger CMS Medicare.gov Compare Post Acute Care list provided to:: Patient Choice offered to / list presented to : Patient [place] ownership interest in [facility]provided to:: Patient    Expected Discharge Plan and Services In-house Referral: Clinical Social Work Discharge Planning Services: CM Consult Post Acute Care Choice: Skilled Nursing Facility Living arrangements for the past 2 months: Single Family Home                                      Prior Living Arrangements/Services Living arrangements for the past 2 months: Single Family Home Lives with:: Self Patient language and need for interpreter reviewed:: Yes Do you feel safe going back to the place where you live?: Yes      Need for Family Participation in Patient Care: Yes (Comment) Care giver support system in place?: Yes (comment)   Criminal Activity/Legal Involvement Pertinent to Current Situation/Hospitalization: No - Comment as needed  Activities of Daily Living      Permission Sought/Granted                  Emotional Assessment Appearance:: Appears stated age Attitude/Demeanor/Rapport: Engaged Affect (typically observed):  Accepting Orientation: : Oriented to Self, Oriented to Place Alcohol / Substance Use: Not Applicable Psych Involvement: No (comment)  Admission diagnosis:  Hypoglycemia [E16.2] Alcohol use [F10.90] AKI (acute kidney injury) [N17.9] Patient Active Problem List   Diagnosis Date Noted   Hypoglycemia 10/10/2024   Acute recurrent frontal sinusitis 11/28/2023   AKI (acute kidney injury) 09/11/2023   Hospital discharge follow-up 09/11/2023   Alcoholic liver disease 09/11/2023   Macrocytic anemia 09/11/2023   Encounter for examination following treatment at hospital 09/11/2023   Peripheral polyneuropathy 09/11/2023   Fall 08/02/2023   Chronic obstructive pulmonary disease (HCC) 03/27/2023   Other specified nutritional anemias 03/27/2023   Acute bronchitis with COPD (HCC) 07/10/2022   Hypokalemia 07/05/2022   Protein-calorie malnutrition 07/04/2022   Alcohol dependence with unspecified alcohol-induced disorder (HCC) 07/04/2022   Allergic sinusitis 07/04/2022   Encounter for general adult medical examination with abnormal findings 11/09/2021   S/P total right hip arthroplasty 11/09/2021   Prostate cancer screening 11/09/2021   Primary hypertension 06/08/2021   Mixed hyperlipidemia 06/08/2021   Tobacco abuse 06/03/2008   PCP:  [name], MD Pharmacy:   [facility] [phone] - [place], [place] - [address] AT [facility] [address] [place] [address] Phone: [phone] Fax: [phone]     Social Drivers of Health (SDOH) Social History: SDOH Screenings   Food Insecurity: Patient Unable  To Answer (10/14/2024)  Housing: Patient Unable To Answer (10/14/2024)  Transportation Needs: Patient Unable To Answer (10/14/2024)  Utilities: Patient Unable To Answer (10/14/2024)  Depression (PHQ2-9): Low Risk  (07/30/2024)  Tobacco Use: High Risk (10/10/2024)   SDOH Interventions:     Readmission Risk Interventions     No data to display

## 2024-10-14 NOTE — Plan of Care (Signed)

## 2024-10-14 NOTE — TOC Progression Note (Signed)
 Transition of Care [facility]) - Progression Note    Patient Details  Name: Donald Stewart MRN: [id number] Date of Birth: 1971/01/24  Transition [facility]) CM/SW Contact  Lucie Lunger, [place] Phone Number: 10/14/2024, 12:44 PM  Clinical Narrative:    CSW spoke with pt to review SNF bed offers. Pt states he prefers to stay in [place] and go to [facility]. CSW reached out to Debbie in admissions to request that insurance auth be started for SNF at their facility. TOC to follow.   Expected Discharge Plan: Skilled Nursing Facility Barriers to Discharge: Continued Medical Work up               Expected Discharge Plan and Services In-house Referral: Clinical Social Work Discharge Planning Services: CM Consult Post Acute Care Choice: Skilled Nursing Facility Living arrangements for the past 2 months: Single Family Home                                       Social Drivers of Health (SDOH) Interventions SDOH Screenings   Food Insecurity: Patient Unable To Answer (10/14/2024)  Housing: Patient Unable To Answer (10/14/2024)  Transportation Needs: Patient Unable To Answer (10/14/2024)  Utilities: Patient Unable To Answer (10/14/2024)  Depression (PHQ2-9): Low Risk  (07/30/2024)  Tobacco Use: High Risk (10/10/2024)    Readmission Risk Interventions     No data to display

## 2024-10-14 NOTE — Progress Notes (Signed)
 PROGRESS NOTE   Donald Stewart  FMW:[id number] DOB: 11/23/1970 DOA: 10/10/2024 PCP: [name], MD   Chief Complaint  Patient presents with   Hypoglycemia   Loss of Consciousness   Level of care: Telemetry  Brief Admission History:   53 y.o. male with medical history significant alcohol and tobacco abuse, asthma/COPD, avascular necrosis of the right hip and HLD presents to the ED unresponsive.- Found to have blood sugar of 15 in the setting of heavy alcohol use over the last couple days as per family members.  -EMS gave patient IM glucagon due to inability to obtain IV access initially.  - IV dextrose  subsequently given.   -Patient had very similar presentation back in December 2024 with heavy alcohol use and hypoglycemia with altered mentation. No fevers noted, no vomiting or diarrhea -No reported falls or head injury -Chest x-ray and CT head without acute findings -UA with ketones and protein otherwise negative -UDS negative   Assessment and Plan:  Delirium Tremens -- slowly improving with aggressive treatments  -- ok to move out of ICU today  Acute metabolic encephalopathy -- secondary to severe life threatening hypoglycemia -- CT head negative for acute findings -- UDS negative  -- suspect underlying Wernicke's disease from chronic alcohol abuse  Essential hypertension -  continue daily amlodipine  5 mg   Hypokalemia Hypomagnesemia -- IV magnesium  and potassium ordered -- recheck in AM   Macrocytic anemia -- stable Hg -- b12 714, folate 8.5 are both reassuring  Severe Hypoglycemia -- from chronic alcohol abuse, poor nutrition -- Blood sugars have been stable following CBG (last 3)  Recent Labs    10/13/24 1212 10/13/24 1609 10/14/24 0758  GLUCAP 109* 120* 109*   AKI -- resolved with supportive measures  Thrombocytopenia -- from chronic alcohol abuse -- stable, no bleeding seen  Adult failure to thrive -- TOC working on SNF rehab placement   -- he will need ongoing alcohol abuse treatment and absolute alcohol cessation   DVT prophylaxis: SCDs Code Status: Full  Family Communication:  Disposition: SNF    Consultants:   Procedures:   Antimicrobials:    Subjective: Pt reports no specific complaints.    Objective: Vitals:   10/14/24 0800 10/14/24 0804 10/14/24 0912 10/14/24 1000  BP: 129/71 129/71 100/61 102/65  Pulse: 90  (!) 101 92  Resp:   15 13  Temp:      TempSrc:      SpO2:   97% 100%  Weight:      Height:        Intake/Output Summary (Last 24 hours) at 10/14/2024 1118 Last data filed at 10/14/2024 0025 Gross per 24 hour  Intake 662.3 ml  Output 1350 ml  Net -687.7 ml   Filed Weights   10/10/24 1038  Weight: 52.2 kg   Examination:  General exam: pt pleasantly confused.  Appears calm and comfortable  Respiratory system: Clear to auscultation. Respiratory effort normal. Cardiovascular system: normal S1 & S2 heard. No JVD, murmurs, rubs, gallops or clicks. No pedal edema. Gastrointestinal system: Abdomen is nondistended, soft and nontender. No organomegaly or masses felt. Normal bowel sounds heard. Central nervous system: Alert and disoriented. No focal neurological deficits. Extremities: Symmetric 5 x 5 power. Skin: No rashes, lesions or ulcers. Psychiatry: Judgement and insight appear poor. Mood & affect appropriate.   Data Reviewed: I have personally reviewed following labs and imaging studies  CBC: Recent Labs  Lab 10/10/24 0908 10/10/24 0911 10/11/24 0404 10/12/24 0404 10/14/24 9673  WBC  --  11.1* 8.6 7.8 6.1  NEUTROABS  --  8.9*  --   --   --   HGB 13.9 13.5 11.5* 10.7* 10.3*  HCT 41.0 42.9 33.5* 31.6* 30.5*  MCV  --  114.1* 104.7* 106.4* 104.8*  PLT  --  147* 129* 106* 135*    Basic Metabolic Panel: Recent Labs  Lab 10/10/24 1547 10/10/24 2227 10/11/24 0404 10/12/24 0404 10/14/24 0326  NA 146* 144 143 145 139  K 3.6 3.1* 3.7 3.5 3.0*  CL 103 102 101 104 99  CO2 19* 27  29 33* 31  GLUCOSE 228* 232* 185* 107* 99  BUN 21* 19 15 <5* 5*  CREATININE 2.08* 1.58* 1.24 0.82 0.62  CALCIUM 7.3* 7.6* 8.0* 9.0 8.9  MG  --  1.1* 2.2 1.8 1.6*  PHOS 4.1  --   --  2.0* 3.1    CBG: Recent Labs  Lab 10/12/24 1204 10/13/24 0805 10/13/24 1212 10/13/24 1609 10/14/24 0758  GLUCAP 122* 126* 109* 120* 109*    Recent Results (from the past 240 hours)  MRSA Next Gen by PCR, Nasal     Status: None   Collection Time: 10/10/24 11:30 AM   Specimen: Nasal Mucosa; Nasal Swab  Result Value Ref Range Status   MRSA by PCR Next Gen NOT DETECTED NOT DETECTED Final    Comment: (NOTE) The GeneXpert MRSA Assay (FDA approved for NASAL specimens only), is one component of a comprehensive MRSA colonization surveillance program. It is not intended to diagnose MRSA infection nor to guide or monitor treatment for MRSA infections. Test performance is not FDA approved in patients less than 33 years old. Performed at [facility], [address] [place], [place] [address]      Radiology Studies: No results found.  Scheduled Meds:  amLODipine   5 mg Oral Daily   Chlorhexidine  Gluconate Cloth  6 each Topical Daily   feeding supplement  237 mL Oral BID BM   folic acid   1 mg Oral Daily   Or   folic acid   1 mg Intravenous Daily   heparin  5,000 Units Subcutaneous Q8H   multivitamin with minerals  1 tablet Oral Daily   nicotine  21 mg Transdermal Daily   sodium chloride  flush  3 mL Intravenous Q12H   sodium chloride  flush  3 mL Intravenous Q12H   thiamine  100 mg Oral Daily   Or   thiamine  100 mg Intravenous Daily   Continuous Infusions:  magnesium  sulfate bolus IVPB 2 g (10/14/24 1106)   potassium chloride  10 mEq (10/14/24 1107)     LOS: 4 days   Time spent: 55 mins  Donald Gumz Vicci, MD How to contact the TRH Attending or Consulting provider 7A - 7P or covering provider during after hours 7P -7A, for this patient?  Check the care team in [facility] and look for a)  attending/consulting TRH provider listed and b) the TRH team listed Log into [email or web address] to find provider on call.  Locate the TRH provider you are looking for under [facility] and page to a number that you can be directly reached. If you still have difficulty reaching the provider, please page the [facility] (Director on Call) for the Hospitalists listed on amion for assistance.  10/14/2024, 11:18 AM

## 2024-10-15 DIAGNOSIS — K709 Alcoholic liver disease, unspecified: Secondary | ICD-10-CM | POA: Diagnosis not present

## 2024-10-15 DIAGNOSIS — I10 Essential (primary) hypertension: Secondary | ICD-10-CM | POA: Diagnosis not present

## 2024-10-15 DIAGNOSIS — E162 Hypoglycemia, unspecified: Secondary | ICD-10-CM | POA: Diagnosis not present

## 2024-10-15 DIAGNOSIS — Z720 Tobacco use: Secondary | ICD-10-CM | POA: Diagnosis not present

## 2024-10-15 LAB — GLUCOSE, CAPILLARY
Glucose-Capillary: 114 mg/dL — ABNORMAL HIGH (ref 70–99)
Glucose-Capillary: 102 mg/dL — ABNORMAL HIGH (ref 70–99)
Glucose-Capillary: 116 mg/dL — ABNORMAL HIGH (ref 70–99)
Glucose-Capillary: 113 mg/dL — ABNORMAL HIGH (ref 70–99)

## 2024-10-15 LAB — CBC
HCT: 29.4 % — ABNORMAL LOW (ref 39.0–52.0)
Hemoglobin: 10 g/dL — ABNORMAL LOW (ref 13.0–17.0)
MCH: 36 pg — ABNORMAL HIGH (ref 26.0–34.0)
MCHC: 34 g/dL (ref 30.0–36.0)
MCV: 105.8 fL — ABNORMAL HIGH (ref 80.0–100.0)
Platelets: 163 K/uL (ref 150–400)
RBC: 2.78 MIL/uL — ABNORMAL LOW (ref 4.22–5.81)
RDW: 12.2 % (ref 11.5–15.5)
WBC: 7.5 K/uL (ref 4.0–10.5)
nRBC: 0 % (ref 0.0–0.2)

## 2024-10-15 LAB — BASIC METABOLIC PANEL WITH GFR
Anion gap: 9 (ref 5–15)
BUN: 10 mg/dL (ref 6–20)
CO2: 28 mmol/L (ref 22–32)
Calcium: 9.2 mg/dL (ref 8.9–10.3)
Chloride: 99 mmol/L (ref 98–111)
Creatinine, Ser: 0.69 mg/dL (ref 0.61–1.24)
GFR, Estimated: >60 mL/min (ref >60)
Glucose, Bld: 92 mg/dL (ref 70–99)
Potassium: 3.9 mmol/L (ref 3.5–5.1)
Sodium: 136 mmol/L (ref 135–145)

## 2024-10-15 LAB — MAGNESIUM: Magnesium: 2.2 mg/dL (ref 1.7–2.4)

## 2024-10-15 MED ORDER — LACTATED RINGERS IV BOLUS
1000.0000 mL | Freq: Once | INTRAVENOUS | Status: AC
  Administered 2024-10-15: 1000 mL via INTRAVENOUS

## 2024-10-15 NOTE — Plan of Care (Signed)

## 2024-10-15 NOTE — TOC Progression Note (Signed)
 Transition of Care [facility]) - Progression Note    Patient Details  Name: Donald Stewart MRN: [id number] Date of Birth: 1971-08-22  Transition of Care [facility]) CM/SW Contact  Lucie Lunger, [place] Phone Number: 10/15/2024, 1:40 PM  Clinical Narrative:    CSW spoke to Debbie with [facility] who states that pts insurance shara is still pending at this time. Debbie to provide updates as soon as she gets them. TOC to follow.   Expected Discharge Plan: Skilled Nursing Facility Barriers to Discharge: Continued Medical Work up, English As A Second Language Teacher               Expected Discharge Plan and Services In-house Referral: Clinical Social Work Discharge Planning Services: [employer] Consult Post Acute Care Choice: Skilled Nursing Facility Living arrangements for the past 2 months: Single Family Home                                       Social Drivers of Health (SDOH) Interventions SDOH Screenings   Food Insecurity: Patient Unable To Answer (10/14/2024)  Housing: Patient Unable To Answer (10/14/2024)  Transportation Needs: Patient Unable To Answer (10/14/2024)  Utilities: Patient Unable To Answer (10/14/2024)  Depression (PHQ2-9): Low Risk  (07/30/2024)  Tobacco Use: High Risk (10/10/2024)    Readmission Risk Interventions     No data to display

## 2024-10-15 NOTE — Progress Notes (Signed)
 PROGRESS NOTE   Donald Stewart  FMW:[id number] DOB: 28-Jan-1971 DOA: 10/10/2024 PCP: [name], MD   Chief Complaint  Patient presents with   Hypoglycemia   Loss of Consciousness   Level of care: Telemetry  Brief Admission History:   53 y.o. male with medical history significant alcohol and tobacco abuse, asthma/COPD, avascular necrosis of the right hip and HLD presents to the ED unresponsive.- Found to have blood sugar of 15 in the setting of heavy alcohol use over the last couple days as per family members.  -EMS gave patient IM glucagon due to inability to obtain IV access initially.  - IV dextrose  subsequently given.   -Patient had very similar presentation back in December 2024 with heavy alcohol use and hypoglycemia with altered mentation. No fevers noted, no vomiting or diarrhea -No reported falls or head injury -Chest x-ray and CT head without acute findings -UA with ketones and protein otherwise negative -UDS negative   Assessment and Plan:  Delirium Tremens -- slowly improving with aggressive treatments  -- ok to move out of ICU  Acute metabolic encephalopathy -- secondary to severe life threatening hypoglycemia -- CT head negative for acute findings -- UDS negative  -- suspect underlying Wernicke's disease from chronic alcohol abuse -- Continue vitamin supplementation as ordered  Essential hypertension -- continue daily amlodipine  5 mg   Hypokalemia Hypomagnesemia -- IV magnesium  and potassium and repleted  Macrocytic anemia -- stable Hg -- b12 714, folate 8.5 are both reassuring  Severe Hypoglycemia -- from chronic alcohol abuse, poor nutrition -- Blood sugars have been stable following CBG (last 3)  Recent Labs    10/14/24 2345 10/15/24 0337 10/15/24 0730  GLUCAP 107* 114* 102*   AKI -- resolved with supportive measures  Thrombocytopenia - improved to normal  -- from chronic alcohol abuse -- stable, no bleeding seen  Adult  failure to thrive -- TOC working on SNF rehab placement  -- he will need ongoing alcohol abuse treatment and absolute alcohol cessation   DVT prophylaxis: SCDs Code Status: Full  Family Communication:  Disposition: SNF    Consultants:   Procedures:   Antimicrobials:    Subjective: Patient verbalizes no specific complaints today.  Remains agreeable to SNF rehab placement.  Objective: Vitals:   10/15/24 0344 10/15/24 0738 10/15/24 0800 10/15/24 0900  BP:   92/63 99/64  Pulse:      Resp:   17 15  Temp: 98.4 F (36.9 C) 98 F (36.7 C)    TempSrc: Oral Oral    SpO2:      Weight:      Height:        Intake/Output Summary (Last 24 hours) at 10/15/2024 0945 Last data filed at 10/15/2024 0606 Gross per 24 hour  Intake 412.32 ml  Output 1825 ml  Net -1412.68 ml   Filed Weights   10/10/24 1038  Weight: 52.2 kg   Examination:  General exam: pt eating breakfast.  He appears cachectic. Appears calm and comfortable  Respiratory system: Clear to auscultation. Respiratory effort normal. Cardiovascular system: normal S1 & S2 heard. No JVD, murmurs, rubs, gallops or clicks. No pedal edema. Gastrointestinal system: Abdomen is nondistended, soft and nontender. No organomegaly or masses felt. Normal bowel sounds heard. Central nervous system: Alert and disoriented. No focal neurological deficits. Extremities: Symmetric 5 x 5 power. Skin: No rashes, lesions or ulcers. Psychiatry: Judgement and insight appear poor. Mood & affect appropriate.   Data Reviewed: I have personally reviewed following labs  and imaging studies  CBC: Recent Labs  Lab 10/10/24 0911 10/11/24 0404 10/12/24 0404 10/14/24 0326 10/15/24 0436  WBC 11.1* 8.6 7.8 6.1 7.5  NEUTROABS 8.9*  --   --   --   --   HGB 13.5 11.5* 10.7* 10.3* 10.0*  HCT 42.9 33.5* 31.6* 30.5* 29.4*  MCV 114.1* 104.7* 106.4* 104.8* 105.8*  PLT 147* 129* 106* 135* 163    Basic Metabolic Panel: Recent Labs  Lab 10/10/24 1547  10/10/24 2227 10/11/24 0404 10/12/24 0404 10/14/24 0326 10/15/24 0436  NA 146* 144 143 145 139 136  K 3.6 3.1* 3.7 3.5 3.0* 3.9  CL 103 102 101 104 99 99  CO2 19* 27 29 33* 31 28  GLUCOSE 228* 232* 185* 107* 99 92  BUN 21* 19 15 <5* 5* 10  CREATININE 2.08* 1.58* 1.24 0.82 0.62 0.69  CALCIUM 7.3* 7.6* 8.0* 9.0 8.9 9.2  MG  --  1.1* 2.2 1.8 1.6* 2.2  PHOS 4.1  --   --  2.0* 3.1  --     CBG: Recent Labs  Lab 10/14/24 1644 10/14/24 1923 10/14/24 2345 10/15/24 0337 10/15/24 0730  GLUCAP 90 107* 107* 114* 102*    Recent Results (from the past 240 hours)  MRSA Next Gen by PCR, Nasal     Status: None   Collection Time: 10/10/24 11:30 AM   Specimen: Nasal Mucosa; Nasal Swab  Result Value Ref Range Status   MRSA by PCR Next Gen NOT DETECTED NOT DETECTED Final    Comment: (NOTE) The GeneXpert MRSA Assay (FDA approved for NASAL specimens only), is one component of a comprehensive MRSA colonization surveillance program. It is not intended to diagnose MRSA infection nor to guide or monitor treatment for MRSA infections. Test performance is not FDA approved in patients less than 90 years old. Performed at [facility], [address], [place], [place] [address]      Radiology Studies: No results found.  Scheduled Meds:  amLODipine   5 mg Oral Daily   Chlorhexidine  Gluconate Cloth  6 each Topical Daily   feeding supplement  237 mL Oral BID BM   folic acid   1 mg Oral Daily   Or   folic acid   1 mg Intravenous Daily   heparin  5,000 Units Subcutaneous Q8H   multivitamin with minerals  1 tablet Oral Daily   nicotine  21 mg Transdermal Daily   sodium chloride  flush  3 mL Intravenous Q12H   sodium chloride  flush  3 mL Intravenous Q12H   thiamine  100 mg Oral Daily   Or   thiamine  100 mg Intravenous Daily   Continuous Infusions:   LOS: 5 days   Time spent: 55 mins  Maykayla Highley Vicci, MD How to contact the [facility] Attending or Consulting provider 7A - 7P or covering provider  during after hours 7P -7A, for this patient?  Check the care team in [facility] and look for a) attending/consulting TRH provider listed and b) the TRH team listed Log into [email or web address] to find provider on call.  Locate the TRH provider you are looking for under [facility] and page to a number that you can be directly reached. If you still have difficulty reaching the provider, please page the [facility] (Director on Call) for the Hospitalists listed on amion for assistance.  10/15/2024, 9:45 AM

## 2024-10-15 NOTE — Progress Notes (Signed)
   10/15/24 1027  Assess: MEWS Score  Temp 98.7 F (37.1 C)  BP (!) 87/64  MAP (mmHg) 70  Pulse Rate (!) 116  Resp 18  SpO2 98 %  O2 Device Room Air  Assess: MEWS Score  MEWS Temp 0  MEWS Systolic 1  MEWS Pulse 2  MEWS RR 0  MEWS LOC 0  MEWS Score 3  MEWS Score Color Yellow  Assess: if the MEWS score is Yellow or Red  Were vital signs accurate and taken at a resting state? Yes  Does the patient meet 2 or more of the SIRS criteria? Yes  Does the patient have a confirmed or suspected source of infection? No  MEWS guidelines implemented  Yes, yellow  Treat  MEWS Interventions Considered administering scheduled or prn medications/treatments as ordered  Take Vital Signs  Increase Vital Sign Frequency  Yellow: Q2hr x1, continue Q4hrs until patient remains green for 12hrs  Escalate  MEWS: Escalate Yellow: Discuss with charge nurse and consider notifying provider and/or RRT  Notify: Charge Nurse/RN  Name of Charge Nurse/RN Notified [facility]  Provider Notification  Provider Name/Title Dr. Purcell  Date Provider Notified 10/15/24  Time Provider Notified 1042  Method of Notification Page  Notification Reason Other (Comment) (yellow mews)  Provider response Other (Comment) (awaiting orders)  Assess: SIRS CRITERIA  SIRS Temperature  0  SIRS Respirations  0  SIRS Pulse 1  SIRS WBC 0  SIRS Score Sum  1

## 2024-10-15 NOTE — Plan of Care (Signed)
   Problem: Education: Goal: Knowledge of General Education information will improve Description Including pain rating scale, medication(s)/side effects and non-pharmacologic comfort measures Outcome: Progressing   Problem: Health Behavior/Discharge Planning: Goal: Ability to manage health-related needs will improve Outcome: Progressing

## 2024-10-16 DIAGNOSIS — J449 Chronic obstructive pulmonary disease, unspecified: Secondary | ICD-10-CM

## 2024-10-16 DIAGNOSIS — E162 Hypoglycemia, unspecified: Secondary | ICD-10-CM | POA: Diagnosis not present

## 2024-10-16 DIAGNOSIS — Z720 Tobacco use: Secondary | ICD-10-CM | POA: Diagnosis not present

## 2024-10-16 DIAGNOSIS — I10 Essential (primary) hypertension: Secondary | ICD-10-CM | POA: Diagnosis not present

## 2024-10-16 LAB — GLUCOSE, CAPILLARY
Glucose-Capillary: 106 mg/dL — ABNORMAL HIGH (ref 70–99)
Glucose-Capillary: 108 mg/dL — ABNORMAL HIGH (ref 70–99)
Glucose-Capillary: 119 mg/dL — ABNORMAL HIGH (ref 70–99)
Glucose-Capillary: 131 mg/dL — ABNORMAL HIGH (ref 70–99)
Glucose-Capillary: 135 mg/dL — ABNORMAL HIGH (ref 70–99)

## 2024-10-16 LAB — CBC
HCT: 27.7 % — ABNORMAL LOW (ref 39.0–52.0)
Hemoglobin: 9.3 g/dL — ABNORMAL LOW (ref 13.0–17.0)
MCH: 35.6 pg — ABNORMAL HIGH (ref 26.0–34.0)
MCHC: 33.6 g/dL (ref 30.0–36.0)
MCV: 106.1 fL — ABNORMAL HIGH (ref 80.0–100.0)
Platelets: 207 K/uL (ref 150–400)
RBC: 2.61 MIL/uL — ABNORMAL LOW (ref 4.22–5.81)
RDW: 12.6 % (ref 11.5–15.5)
WBC: 7.1 K/uL (ref 4.0–10.5)
nRBC: 0.3 % — ABNORMAL HIGH (ref 0.0–0.2)

## 2024-10-16 LAB — BASIC METABOLIC PANEL WITH GFR
Anion gap: 9 (ref 5–15)
BUN: 12 mg/dL (ref 6–20)
CO2: 28 mmol/L (ref 22–32)
Calcium: 9.3 mg/dL (ref 8.9–10.3)
Chloride: 100 mmol/L (ref 98–111)
Creatinine, Ser: 0.67 mg/dL (ref 0.61–1.24)
GFR, Estimated: >60 mL/min (ref >60)
Glucose, Bld: 91 mg/dL (ref 70–99)
Potassium: 3.7 mmol/L (ref 3.5–5.1)
Sodium: 138 mmol/L (ref 135–145)

## 2024-10-16 LAB — MAGNESIUM: Magnesium: 1.8 mg/dL (ref 1.7–2.4)

## 2024-10-16 MED ORDER — MAGNESIUM SULFATE 2 GM/50ML IV SOLN
2.0000 g | Freq: Once | INTRAVENOUS | Status: AC
  Administered 2024-10-16: 2 g via INTRAVENOUS
  Filled 2024-10-16: qty 50

## 2024-10-16 NOTE — Progress Notes (Signed)
 Physical Therapy Treatment Patient Details Name: Donald Stewart MRN: [id number] DOB: 11-27-1970 Today's Date: 10/16/2024   History of Present Illness Donald Stewart is a 53 y.o. male with medical history significant alcohol and tobacco abuse, asthma/COPD, avascular necrosis of the right hip and HLD presents to the ED unresponsive.-  Found to have blood sugar of 15 in the setting of heavy alcohol use over the last couple days as per family members  -EMS gave patient IM glucagon due to inability to obtain IV access initially  =-IV dextrose  subsequently given  -Patient had very similar presentation back in December 2024 with heavy alcohol use and hypoglycemia with altered mentation  .  No fevers noted, no vomiting or diarrhea  -No reported falls or head injury  -Chest x-ray and CT head without acute findings  - UA with ketones and protein otherwise negative  - UDS negative    PT Comments  Patient agreeable to PT treatment session. Patient is much more alert than when evaluated initially. Reports no pain at start of session. Patient received in bed, required CGA/supervision for bed mobility this date. For transfers and very short ambulation with RW, pt required CGA due to mild unsteadiness. Pt limited due to fatigue. Tolerates sitting in recliner at end of session, chair alarm set, call button in reach, assisted to set up breakfast and all needs met. Patient will benefit from continued skilled physical therapy acutely and in recommended venue in order to address current deficits and improve overall function.     If plan is discharge home, recommend the following: A lot of help with walking and/or transfers;Assistance with feeding;A lot of help with bathing/dressing/bathroom;Assist for transportation;Help with stairs or ramp for entrance;Supervision due to cognitive status;Assistance with cooking/housework   Can travel by private vehicle        Equipment Recommendations  None recommended by  PT    Recommendations for Other Services       Precautions / Restrictions Precautions Precautions: Fall Recall of Precautions/Restrictions: Intact Restrictions Weight Bearing Restrictions Per Provider Order: No     Mobility  Bed Mobility Overal bed mobility: Needs Assistance Bed Mobility: Supine to Sit     Supine to sit: Supervision, Contact guard     General bed mobility comments: much improvement with bed mobility this date as pt more alert, HOB flattened, pt required inc time due to slow labored movemetn secondary to general weakness and used railings, CGA provided for safety    Transfers Overall transfer level: Needs assistance Equipment used: Rolling walker (2 wheels) Transfers: Sit to/from Stand, Bed to chair/wheelchair/BSC Sit to Stand: Contact guard assist   Step pivot transfers: Contact guard assist       General transfer comment: STS from bed with RW and CAG for safety as pt is unsteady on his feet, cont slow labored movement requiring inc time    Ambulation/Gait Ambulation/Gait assistance: Contact guard assist Gait Distance (Feet): 3 Feet Assistive device: Rolling walker (2 wheels) Gait Pattern/deviations: Step-to pattern, Decreased step length - right, Decreased step length - left, Decreased stride length, Trunk flexed Gait velocity: Dec     General Gait Details: Limited to a few side steps from bed to chair this date due to fatigue and general weakness, pt demo very short step lengths and unsteadiness   Stairs             Wheelchair Mobility     Tilt Bed    Modified Rankin (Stroke Patients Only)  Balance Overall balance assessment: Needs assistance Sitting-balance support: Feet supported, No upper extremity supported, Bilateral upper extremity supported Sitting balance-Leahy Scale: Fair Sitting balance - Comments: improved sitting balance this date, sitting EOB and does not require assist   Standing balance support: During  functional activity, Bilateral upper extremity supported, Reliant on assistive device for balance Standing balance-Leahy Scale: Fair Standing balance comment: w/ RW                            Communication Communication Communication: No apparent difficulties Factors Affecting Communication: Reduced clarity of speech  Cognition Arousal: Alert Behavior During Therapy: WFL for tasks assessed/performed, Flat affect                             Following commands: Intact      Cueing Cueing Techniques: Verbal cues  Exercises      General Comments        Pertinent Vitals/Pain Pain Assessment Pain Assessment: No/denies pain    Home Living                          Prior Function            PT Goals (current goals can now be found in the care plan section) Acute Rehab PT Goals Patient Stated Goal: Pt agreeable to rehab Progress towards PT goals: Progressing toward goals    Frequency    Min 3X/week      PT Plan      Co-evaluation              AM-PAC PT 6 Clicks Mobility   Outcome Measure  Help needed turning from your back to your side while in a flat bed without using bedrails?: A Little Help needed moving from lying on your back to sitting on the side of a flat bed without using bedrails?: A Little Help needed moving to and from a bed to a chair (including a wheelchair)?: A Little Help needed standing up from a chair using your arms (e.g., wheelchair or bedside chair)?: A Little Help needed to walk in hospital room?: A Little Help needed climbing 3-5 steps with a railing? : A Lot 6 Click Score: 17    End of Session Equipment Utilized During Treatment: Gait belt Activity Tolerance: Patient limited by fatigue;Patient tolerated treatment well Patient left: in chair;with call bell/phone within reach;with chair alarm set   PT Visit Diagnosis: Other abnormalities of gait and mobility (R26.89);Muscle weakness (generalized)  (M62.81);Other symptoms and signs involving the nervous system (R29.898);Difficulty in walking, not elsewhere classified (R26.2);Adult, failure to thrive (R62.7)     Time: 9070-9057 PT Time Calculation (min) (ACUTE ONLY): 13 min  Charges:    $Therapeutic Activity: 8-22 mins PT General Charges $$ ACUTE PT VISIT: 1 Visit                     2:23 PM, 10/16/24 Rosaria Settler, PT, DPT [place] with [facility]

## 2024-10-16 NOTE — Plan of Care (Signed)

## 2024-10-16 NOTE — Progress Notes (Signed)
 PROGRESS NOTE   Donald Stewart  FMW:[id number] DOB: 1971-09-08 DOA: 10/10/2024 PCP: [name], MD   Chief Complaint  Patient presents with   Hypoglycemia   Loss of Consciousness   Level of care: Med-Surg  Brief Admission History:   53 y.o. male with medical history significant alcohol and tobacco abuse, asthma/COPD, avascular necrosis of the right hip and HLD presents to the ED unresponsive.- Found to have blood sugar of 15 in the setting of heavy alcohol use over the last couple days as per family members.  -EMS gave patient IM glucagon due to inability to obtain IV access initially.  - IV dextrose  subsequently given.   -Patient had very similar presentation back in December 2024 with heavy alcohol use and hypoglycemia with altered mentation. No fevers noted, no vomiting or diarrhea -No reported falls or head injury -Chest x-ray and CT head without acute findings -UA with ketones and protein otherwise negative -UDS negative   Assessment and Plan:  Delirium Tremens -- improving with aggressive treatments  -- ok to move out of ICU  Acute metabolic encephalopathy - resolving -- secondary to severe life threatening hypoglycemia -- CT head negative for acute findings -- UDS negative  -- suspect underlying Wernicke's disease from chronic alcohol abuse -- Continue vitamin supplementation as ordered  Essential hypertension -- continue daily amlodipine  5 mg   Hypokalemia Hypomagnesemia -- IV magnesium  and potassium ordered and repleted  Macrocytic anemia -- stable Hg -- b12 714, folate 8.5 are both reassuring  Severe Hypoglycemia -- from chronic alcohol abuse, poor nutrition -- Blood sugars have been stable following CBG (last 3)  Recent Labs    10/15/24 1623 10/16/24 0725 10/16/24 1127  GLUCAP 113* 108* 135*   AKI -- resolved with supportive measures  Thrombocytopenia - improved to normal  -- from chronic alcohol abuse -- stable, no bleeding  seen  Adult failure to thrive -- TOC working on SNF rehab placement  -- he will need ongoing alcohol abuse treatment and absolute alcohol cessation   DVT prophylaxis: SCDs Code Status: Full  Family Communication:  Disposition: SNF    Consultants:   Procedures:   Antimicrobials:    Subjective: Pt reports he has been eating a bit more in last 2 days.   Objective: Vitals:   10/15/24 1956 10/15/24 2335 10/16/24 0535 10/16/24 1300  BP: 100/68 109/80 109/71 91/66  Pulse: 96   100  Resp: 16 18 20 18   Temp: 98.7 F (37.1 C) 99.3 F (37.4 C) 98.6 F (37 C) 98.7 F (37.1 C)  TempSrc: Oral Oral Oral Oral  SpO2: 96% 94% 96% 96%  Weight:      Height:        Intake/Output Summary (Last 24 hours) at 10/16/2024 1521 Last data filed at 10/16/2024 1300 Gross per 24 hour  Intake 480 ml  Output 1100 ml  Net -620 ml   Filed Weights   10/10/24 1038  Weight: 52.2 kg   Examination:  General exam: He appears cachectic. Appears calm and comfortable.   Respiratory system: Clear to auscultation. Respiratory effort normal. Cardiovascular system: normal S1 & S2 heard. No JVD, murmurs, rubs, gallops or clicks. No pedal edema. Gastrointestinal system: Abdomen is nondistended, soft and nontender. No organomegaly or masses felt. Normal bowel sounds heard. Central nervous system: Alert and disoriented. No focal neurological deficits. Extremities: Symmetric 5 x 5 power. Skin: No rashes, lesions or ulcers. Psychiatry: Judgement and insight appear poor. Mood & affect appropriate.   Data Reviewed:  I have personally reviewed following labs and imaging studies  CBC: Recent Labs  Lab 10/10/24 0911 10/11/24 0404 10/12/24 0404 10/14/24 0326 10/15/24 0436 10/16/24 0423  WBC 11.1* 8.6 7.8 6.1 7.5 7.1  NEUTROABS 8.9*  --   --   --   --   --   HGB 13.5 11.5* 10.7* 10.3* 10.0* 9.3*  HCT 42.9 33.5* 31.6* 30.5* 29.4* 27.7*  MCV 114.1* 104.7* 106.4* 104.8* 105.8* 106.1*  PLT 147* 129* 106* 135*  163 207    Basic Metabolic Panel: Recent Labs  Lab 10/10/24 1547 10/10/24 2227 10/11/24 0404 10/12/24 0404 10/14/24 0326 10/15/24 0436 10/16/24 0423  NA 146*   < > 143 145 139 136 138  K 3.6   < > 3.7 3.5 3.0* 3.9 3.7  CL 103   < > 101 104 99 99 100  CO2 19*   < > 29 33* 31 28 28   GLUCOSE 228*   < > 185* 107* 99 92 91  BUN 21*   < > 15 <5* 5* 10 12  CREATININE 2.08*   < > 1.24 0.82 0.62 0.69 0.67  CALCIUM 7.3*   < > 8.0* 9.0 8.9 9.2 9.3  MG  --    < > 2.2 1.8 1.6* 2.2 1.8  PHOS 4.1  --   --  2.0* 3.1  --   --    < > = values in this interval not displayed.    CBG: Recent Labs  Lab 10/15/24 0730 10/15/24 1138 10/15/24 1623 10/16/24 0725 10/16/24 1127  GLUCAP 102* 116* 113* 108* 135*    Recent Results (from the past 240 hours)  MRSA Next Gen by PCR, Nasal     Status: None   Collection Time: 10/10/24 11:30 AM   Specimen: Nasal Mucosa; Nasal Swab  Result Value Ref Range Status   MRSA by PCR Next Gen NOT DETECTED NOT DETECTED Final    Comment: (NOTE) The GeneXpert MRSA Assay (FDA approved for NASAL specimens only), is one component of a comprehensive MRSA colonization surveillance program. It is not intended to diagnose MRSA infection nor to guide or monitor treatment for MRSA infections. Test performance is not FDA approved in patients less than 67 years old. Performed at [facility], [address] [place], [place] [address]      Radiology Studies: No results found.  Scheduled Meds:  amLODipine   5 mg Oral Daily   Chlorhexidine  Gluconate Cloth  6 each Topical Daily   feeding supplement  237 mL Oral BID BM   folic acid   1 mg Oral Daily   heparin   5,000 Units Subcutaneous Q8H   multivitamin with minerals  1 tablet Oral Daily   nicotine   21 mg Transdermal Daily   sodium chloride  flush  3 mL Intravenous Q12H   sodium chloride  flush  3 mL Intravenous Q12H   thiamine   100 mg Oral Daily   Continuous Infusions:   LOS: 6 days   Time spent: 55  mins  Tziporah Knoke Vicci, MD How to contact the [facility] Attending or Consulting provider 7A - 7P or covering provider during after hours 7P -7A, for this patient?  Check the care team in [facility] and look for a) attending/consulting TRH provider listed and b) the TRH team listed Log into [email or web address] to find provider on call.  Locate the TRH provider you are looking for under [facility] and page to a number that you can be directly reached. If you still have difficulty reaching the provider,  please page the [facility] (Director on Call) for the Hospitalists listed on amion for assistance.  10/16/2024, 3:21 PM

## 2024-10-17 DIAGNOSIS — Z720 Tobacco use: Secondary | ICD-10-CM | POA: Diagnosis not present

## 2024-10-17 DIAGNOSIS — E162 Hypoglycemia, unspecified: Secondary | ICD-10-CM | POA: Diagnosis not present

## 2024-10-17 DIAGNOSIS — J449 Chronic obstructive pulmonary disease, unspecified: Secondary | ICD-10-CM | POA: Diagnosis not present

## 2024-10-17 DIAGNOSIS — I10 Essential (primary) hypertension: Secondary | ICD-10-CM | POA: Diagnosis not present

## 2024-10-17 LAB — GLUCOSE, CAPILLARY
Glucose-Capillary: 102 mg/dL — ABNORMAL HIGH (ref 70–99)
Glucose-Capillary: 102 mg/dL — ABNORMAL HIGH (ref 70–99)
Glucose-Capillary: 120 mg/dL — ABNORMAL HIGH (ref 70–99)
Glucose-Capillary: 118 mg/dL — ABNORMAL HIGH (ref 70–99)
Glucose-Capillary: 111 mg/dL — ABNORMAL HIGH (ref 70–99)
Glucose-Capillary: 103 mg/dL — ABNORMAL HIGH (ref 70–99)

## 2024-10-17 NOTE — Plan of Care (Signed)

## 2024-10-17 NOTE — Progress Notes (Signed)
 PROGRESS NOTE   Donald Stewart  FMW:[id number] DOB: [birth year] DOA: 10/10/2024 PCP: [name], MD   Chief Complaint  Patient presents with   Hypoglycemia   Loss of Consciousness   Level of care: Med-Surg  Brief Admission History:   53 y.o. male with medical history significant alcohol and tobacco abuse, asthma/COPD, avascular necrosis of the right hip and HLD presents to the ED unresponsive.- Found to have blood sugar of 15 in the setting of heavy alcohol use over the last couple days as per family members.  -EMS gave patient IM glucagon due to inability to obtain IV access initially.  - IV dextrose  subsequently given.   -Patient had very similar presentation back in December 2024 with heavy alcohol use and hypoglycemia with altered mentation. No fevers noted, no vomiting or diarrhea -No reported falls or head injury -Chest x-ray and CT head without acute findings -UA with ketones and protein otherwise negative -UDS negative   Assessment and Plan:  Delirium Tremens -- improving with aggressive treatments   Acute metabolic encephalopathy - resolving -- secondary to severe life threatening hypoglycemia -- CT head negative for acute findings -- UDS negative  -- suspect underlying Wernicke's disease from chronic alcohol abuse -- Continue vitamin supplementation as ordered  Essential hypertension -- stopping daily amlodipine  5 mg due to soft BPs  Hypokalemia Hypomagnesemia -- IV magnesium  and potassium ordered and repleted  Macrocytic anemia -- stable Hg -- b12 714, folate 8.5 are both reassuring  Severe Hypoglycemia -- from chronic alcohol abuse, poor nutrition -- Blood sugars have been stable following CBG (last 3)  Recent Labs    10/17/24 0410 10/17/24 0722 10/17/24 1110  GLUCAP 102* 102* 120*   AKI -- resolved with supportive measures  Thrombocytopenia - improved to normal  -- from chronic alcohol abuse -- stable, no bleeding seen  Adult  failure to thrive -- TOC working on SNF rehab placement  -- he will need ongoing alcohol abuse treatment and absolute alcohol cessation   DVT prophylaxis: SCDs Code Status: Full  Family Communication: mother phone, spouse bedside Disposition: SNF    Consultants:   Procedures:   Antimicrobials:    Subjective: Pt says he is agreeable to going to SNF   Objective: Vitals:   10/16/24 2028 10/16/24 2058 10/17/24 0413 10/17/24 1244  BP: 91/60 110/60 115/73 92/61  Pulse: 92 74 80 90  Resp: 18  18 18   Temp: 99.4 F (37.4 C)  98.6 F (37 C) 98.2 F (36.8 C)  TempSrc: Oral  Oral Oral  SpO2: 93%  93% 96%  Weight:      Height:        Intake/Output Summary (Last 24 hours) at 10/17/2024 1417 Last data filed at 10/17/2024 1245 Gross per 24 hour  Intake 886 ml  Output 2200 ml  Net -1314 ml   Filed Weights   10/10/24 1038  Weight: 52.2 kg   Examination:  General exam: He appears cachectic. Appears calm and comfortable.   Respiratory system: Clear to auscultation. Respiratory effort normal. Cardiovascular system: normal S1 & S2 heard. No JVD, murmurs, rubs, gallops or clicks. No pedal edema. Gastrointestinal system: Abdomen is nondistended, soft and nontender. No organomegaly or masses felt. Normal bowel sounds heard. Central nervous system: Alert and disoriented. No focal neurological deficits. Extremities: Symmetric 5 x 5 power. Skin: No rashes, lesions or ulcers. Psychiatry: Judgement and insight appear poor. Mood & affect appropriate.   Data Reviewed: I have personally reviewed following labs and imaging  studies  CBC: Recent Labs  Lab 10/11/24 0404 10/12/24 0404 10/14/24 0326 10/15/24 0436 10/16/24 0423  WBC 8.6 7.8 6.1 7.5 7.1  HGB 11.5* 10.7* 10.3* 10.0* 9.3*  HCT 33.5* 31.6* 30.5* 29.4* 27.7*  MCV 104.7* 106.4* 104.8* 105.8* 106.1*  PLT 129* 106* 135* 163 207    Basic Metabolic Panel: Recent Labs  Lab 10/10/24 1547 10/10/24 2227 10/11/24 0404  10/12/24 0404 10/14/24 0326 10/15/24 0436 10/16/24 0423  NA 146*   < > 143 145 139 136 138  K 3.6   < > 3.7 3.5 3.0* 3.9 3.7  CL 103   < > 101 104 99 99 100  CO2 19*   < > 29 33* 31 28 28   GLUCOSE 228*   < > 185* 107* 99 92 91  BUN 21*   < > 15 <5* 5* 10 12  CREATININE 2.08*   < > 1.24 0.82 0.62 0.69 0.67  CALCIUM 7.3*   < > 8.0* 9.0 8.9 9.2 9.3  MG  --    < > 2.2 1.8 1.6* 2.2 1.8  PHOS 4.1  --   --  2.0* 3.1  --   --    < > = values in this interval not displayed.    CBG: Recent Labs  Lab 10/16/24 2034 10/16/24 2339 10/17/24 0410 10/17/24 0722 10/17/24 1110  GLUCAP 131* 119* 102* 102* 120*    Recent Results (from the past 240 hours)  MRSA Next Gen by PCR, Nasal     Status: None   Collection Time: 10/10/24 11:30 AM   Specimen: Nasal Mucosa; Nasal Swab  Result Value Ref Range Status   MRSA by PCR Next Gen NOT DETECTED NOT DETECTED Final    Comment: (NOTE) The GeneXpert MRSA Assay (FDA approved for NASAL specimens only), is one component of a comprehensive MRSA colonization surveillance program. It is not intended to diagnose MRSA infection nor to guide or monitor treatment for MRSA infections. Test performance is not FDA approved in patients less than 35 years old. Performed at [facility], [address], [place], [place] [address]      Radiology Studies: No results found.  Scheduled Meds:  amLODipine   5 mg Oral Daily   Chlorhexidine  Gluconate Cloth  6 each Topical Daily   feeding supplement  237 mL Oral BID BM   folic acid   1 mg Oral Daily   heparin   5,000 Units Subcutaneous Q8H   multivitamin with minerals  1 tablet Oral Daily   nicotine   21 mg Transdermal Daily   sodium chloride  flush  3 mL Intravenous Q12H   sodium chloride  flush  3 mL Intravenous Q12H   thiamine   100 mg Oral Daily   Continuous Infusions:   LOS: 7 days   Time spent: 55 mins  Roosevelt Bisher Vicci, MD How to contact the [facility] Attending or Consulting provider 7A - 7P or covering provider  during after hours 7P -7A, for this patient?  Check the care team in [facility] and look for a) attending/consulting TRH provider listed and b) the TRH team listed Log into [email or web address] to find provider on call.  Locate the TRH provider you are looking for under [facility] and page to a number that you can be directly reached. If you still have difficulty reaching the provider, please page the [facility] (Director on Call) for the Hospitalists listed on amion for assistance.  10/17/2024, 2:17 PM

## 2024-10-18 DIAGNOSIS — I10 Essential (primary) hypertension: Secondary | ICD-10-CM | POA: Diagnosis not present

## 2024-10-18 DIAGNOSIS — Z720 Tobacco use: Secondary | ICD-10-CM | POA: Diagnosis not present

## 2024-10-18 DIAGNOSIS — E162 Hypoglycemia, unspecified: Secondary | ICD-10-CM | POA: Diagnosis not present

## 2024-10-18 DIAGNOSIS — J449 Chronic obstructive pulmonary disease, unspecified: Secondary | ICD-10-CM | POA: Diagnosis not present

## 2024-10-18 LAB — GLUCOSE, CAPILLARY
Glucose-Capillary: 105 mg/dL — ABNORMAL HIGH (ref 70–99)
Glucose-Capillary: 104 mg/dL — ABNORMAL HIGH (ref 70–99)
Glucose-Capillary: 129 mg/dL — ABNORMAL HIGH (ref 70–99)
Glucose-Capillary: 106 mg/dL — ABNORMAL HIGH (ref 70–99)
Glucose-Capillary: 119 mg/dL — ABNORMAL HIGH (ref 70–99)

## 2024-10-18 LAB — MAGNESIUM: Magnesium: 1.8 mg/dL (ref 1.7–2.4)

## 2024-10-18 MED ORDER — MAGNESIUM SULFATE 4 GM/100ML IV SOLN
4.0000 g | Freq: Once | INTRAVENOUS | Status: AC
  Administered 2024-10-18: 4 g via INTRAVENOUS
  Filled 2024-10-18: qty 100

## 2024-10-18 NOTE — Progress Notes (Signed)
 PROGRESS NOTE   Donald Stewart  FMW:[id number] DOB: 26-Jan-1971 DOA: 10/10/2024 PCP: [name], MD   Chief Complaint  Patient presents with   Hypoglycemia   Loss of Consciousness   Level of care: Med-Surg  Brief Admission History:   53 y.o. male with medical history significant alcohol and tobacco abuse, asthma/COPD, avascular necrosis of the right hip and HLD presents to the ED unresponsive.- Found to have blood sugar of 15 in the setting of heavy alcohol use over the last couple days as per family members.  -EMS gave patient IM glucagon due to inability to obtain IV access initially.  - IV dextrose  subsequently given.   -Patient had very similar presentation back in December 2024 with heavy alcohol use and hypoglycemia with altered mentation. No fevers noted, no vomiting or diarrhea -No reported falls or head injury -Chest x-ray and CT head without acute findings -UA with ketones and protein otherwise negative -UDS negative   Assessment and Plan:  Delirium Tremens -- resolved now   Acute metabolic encephalopathy - resolved -- secondary to severe life threatening hypoglycemia -- CT head negative for acute findings -- UDS negative  -- suspect underlying Wernicke's disease from chronic alcohol abuse -- Continue vitamin supplementation as ordered  Essential hypertension -- stopping daily amlodipine  5 mg due to soft BPs  Hypokalemia Hypomagnesemia -- IV magnesium  and potassium ordered and repleted  Macrocytic anemia -- stable Hg -- b12 714, folate 8.5 are both reassuring  Severe Hypoglycemia -- from chronic alcohol abuse, poor nutrition -- Blood sugars have been stable for days, DC CBG testing CBG (last 3)  Recent Labs    10/18/24 0339 10/18/24 0707 10/18/24 1127  GLUCAP 105* 104* 129*   AKI -- resolved with supportive measures  Thrombocytopenia - improved to normal  -- from chronic alcohol abuse -- stable, no bleeding seen  Adult failure to  thrive -- TOC working on SNF rehab placement  -- he will need ongoing alcohol abuse treatment and absolute alcohol cessation   DVT prophylaxis: SCDs Code Status: Full  Family Communication: mother phone, spouse bedside Disposition: SNF    Consultants:   Procedures:   Antimicrobials:    Subjective: Pt says he is eating better and appetite improving.   Objective: Vitals:   10/17/24 0413 10/17/24 1244 10/17/24 1954 10/18/24 0342  BP: 115/73 92/61 99/62  99/71  Pulse: 80 90 91 80  Resp: 18 18 16 16   Temp: 98.6 F (37 C) 98.2 F (36.8 C) 99.1 F (37.3 C) 98.5 F (36.9 C)  TempSrc: Oral Oral Oral Oral  SpO2: 93% 96% 93% 95%  Weight:      Height:        Intake/Output Summary (Last 24 hours) at 10/18/2024 1213 Last data filed at 10/18/2024 0900 Gross per 24 hour  Intake 963 ml  Output 1650 ml  Net -687 ml   Filed Weights   10/10/24 1038  Weight: 52.2 kg   Examination:  General exam: He appears cachectic. Appears calm and comfortable.   Respiratory system: Clear to auscultation. Respiratory effort normal. Cardiovascular system: normal S1 & S2 heard. No JVD, murmurs, rubs, gallops or clicks. No pedal edema. Gastrointestinal system: Abdomen is nondistended, soft and nontender. No organomegaly or masses felt. Normal bowel sounds heard. Central nervous system: Alert and disoriented. No focal neurological deficits. Extremities: Symmetric 5 x 5 power. Skin: No rashes, lesions or ulcers. Psychiatry: Judgement and insight appear poor. Mood & affect appropriate.   Data Reviewed: I have personally  reviewed following labs and imaging studies  CBC: Recent Labs  Lab 10/12/24 0404 10/14/24 0326 10/15/24 0436 10/16/24 0423  WBC 7.8 6.1 7.5 7.1  HGB 10.7* 10.3* 10.0* 9.3*  HCT 31.6* 30.5* 29.4* 27.7*  MCV 106.4* 104.8* 105.8* 106.1*  PLT 106* 135* 163 207    Basic Metabolic Panel: Recent Labs  Lab 10/12/24 0404 10/14/24 0326 10/15/24 0436 10/16/24 0423  10/18/24 0406  NA 145 139 136 138  --   K 3.5 3.0* 3.9 3.7  --   CL 104 99 99 100  --   CO2 33* 31 28 28   --   GLUCOSE 107* 99 92 91  --   BUN <5* 5* 10 12  --   CREATININE 0.82 0.62 0.69 0.67  --   CALCIUM 9.0 8.9 9.2 9.3  --   MG 1.8 1.6* 2.2 1.8 1.8  PHOS 2.0* 3.1  --   --   --     CBG: Recent Labs  Lab 10/17/24 1959 10/17/24 2302 10/18/24 0339 10/18/24 0707 10/18/24 1127  GLUCAP 111* 103* 105* 104* 129*    Recent Results (from the past 240 hours)  MRSA Next Gen by PCR, Nasal     Status: None   Collection Time: 10/10/24 11:30 AM   Specimen: Nasal Mucosa; Nasal Swab  Result Value Ref Range Status   MRSA by PCR Next Gen NOT DETECTED NOT DETECTED Final    Comment: (NOTE) The GeneXpert MRSA Assay (FDA approved for NASAL specimens only), is one component of a comprehensive MRSA colonization surveillance program. It is not intended to diagnose MRSA infection nor to guide or monitor treatment for MRSA infections. Test performance is not FDA approved in patients less than 9 years old. Performed at [facility], [address], [place], [place] [address]      Radiology Studies: No results found.  Scheduled Meds:  Chlorhexidine  Gluconate Cloth  6 each Topical Daily   feeding supplement  237 mL Oral BID BM   folic acid   1 mg Oral Daily   heparin   5,000 Units Subcutaneous Q8H   multivitamin with minerals  1 tablet Oral Daily   nicotine   21 mg Transdermal Daily   sodium chloride  flush  3 mL Intravenous Q12H   sodium chloride  flush  3 mL Intravenous Q12H   thiamine   100 mg Oral Daily   Continuous Infusions:  magnesium  sulfate bolus IVPB 4 g (10/18/24 1051)    LOS: 8 days   Time spent: 55 mins  Aaniyah Strohm Vicci, MD How to contact the TRH Attending or Consulting provider 7A - 7P or covering provider during after hours 7P -7A, for this patient?  Check the care team in [facility] and look for a) attending/consulting TRH provider listed and b) the TRH team listed Log into  [email or web address] to find provider on call.  Locate the TRH provider you are looking for under [facility] and page to a number that you can be directly reached. If you still have difficulty reaching the provider, please page the [facility] (Director on Call) for the Hospitalists listed on amion for assistance.  10/18/2024, 12:13 PM

## 2024-10-18 NOTE — Plan of Care (Signed)

## 2024-10-19 LAB — GLUCOSE, CAPILLARY
Glucose-Capillary: 103 mg/dL — ABNORMAL HIGH (ref 70–99)
Glucose-Capillary: 103 mg/dL — ABNORMAL HIGH (ref 70–99)
Glucose-Capillary: 131 mg/dL — ABNORMAL HIGH (ref 70–99)

## 2024-10-19 NOTE — Plan of Care (Signed)
   Problem: Education: Goal: Knowledge of General Education information will improve Description: Including pain rating scale, medication(s)/side effects and non-pharmacologic comfort measures Outcome: Progressing   Problem: Nutrition: Goal: Adequate nutrition will be maintained Outcome: Progressing   Problem: Coping: Goal: Level of anxiety will decrease Outcome: Progressing

## 2024-10-19 NOTE — Plan of Care (Signed)
   Problem: Education: Goal: Knowledge of General Education information will improve Description Including pain rating scale, medication(s)/side effects and non-pharmacologic comfort measures Outcome: Progressing   Problem: Health Behavior/Discharge Planning: Goal: Ability to manage health-related needs will improve Outcome: Progressing

## 2024-10-19 NOTE — Consult Note (Signed)
 WOC Nurse Consult Note: Reason for Consult: stage 2 pressure injury sacrum  Wound type: Stage 2 Pressure injury sacrum/B upper medial buttocks  Pressure Injury POA: no  Measurement: see nursing flowsheet  Wound bed:red moist, has appearance of moisture and friction contributing  Drainage (amount, consistency, odor) see nursing flowsheet  Periwound: dark discoloration to surrounding skin, sloughing skin  Dressing procedure/placement/frequency: Cleanse sacral/buttocks wound with NS, apply silver hydrofiber Soila [phone]) Aquacel AG to wound bed daily and secure with silicone foam or ABD pad and clothe tape whichever is preferred.   POC discussed with bedside nurse. WOC team will not follow. Reconsult for worsening.    Thank you,    Powell Bar MSN, RN-BC, [employer]

## 2024-10-19 NOTE — Progress Notes (Signed)
 Physical Therapy Treatment Patient Details Name: Donald Stewart MRN: [id number] DOB: 06-29-1971 Today's Date: 10/19/2024   History of Present Illness Donald Stewart is a 53 y.o. male with medical history significant alcohol and tobacco abuse, asthma/COPD, avascular necrosis of the right hip and HLD presents to the ED unresponsive.-  Found to have blood sugar of 15 in the setting of heavy alcohol use over the last couple days as per family members  -EMS gave patient IM glucagon due to inability to obtain IV access initially  =-IV dextrose  subsequently given  -Patient had very similar presentation back in December 2024 with heavy alcohol use and hypoglycemia with altered mentation  .  No fevers noted, no vomiting or diarrhea  -No reported falls or head injury  -Chest x-ray and CT head without acute findings  - UA with ketones and protein otherwise negative  - UDS negative    PT Comments  Patient agreeable and motivated for therapy. Patient demonstrates slow labored movement for sitting up at bedside, very unsteady on feet and required the use of RW for gait training and demonstrates increased endurance/distance for ambulating in room and hallway without loss of balance, but limited mostly due to c/o fatigue. Patient tolerated sitting up in chair to eat lunch after therapy. Patient will benefit from continued skilled physical therapy in hospital and recommended venue below to increase strength, balance, endurance for safe ADLs and gait.       If plan is discharge home, recommend the following: A little help with bathing/dressing/bathroom;A little help with walking and/or transfers;Assistance with cooking/housework;Assist for transportation;Help with stairs or ramp for entrance   Can travel by private vehicle     Yes  Equipment Recommendations  None recommended by PT    Recommendations for Other Services       Precautions / Restrictions Precautions Precautions: Fall Recall of  Precautions/Restrictions: Intact Restrictions Weight Bearing Restrictions Per Provider Order: No     Mobility  Bed Mobility Overal bed mobility: Needs Assistance Bed Mobility: Supine to Sit     Supine to sit: Min assist     General bed mobility comments: slow labored movement    Transfers Overall transfer level: Needs assistance Equipment used: Rolling walker (2 wheels) Transfers: Sit to/from Stand, Bed to chair/wheelchair/BSC Sit to Stand: Contact guard assist, Min assist   Step pivot transfers: Contact guard assist, Min assist       General transfer comment: unsteady labored movement requiring use of RW due to poor standing balance    Ambulation/Gait Ambulation/Gait assistance: Min assist Gait Distance (Feet): 35 Feet Assistive device: Rolling walker (2 wheels) Gait Pattern/deviations: Decreased step length - right, Decreased step length - left, Decreased stride length Gait velocity: Dec     General Gait Details: increased endurance/distance for ambulation demonstrating slow labored movement without loss of balance using RW, limited mostly due to fatigue   Stairs             Wheelchair Mobility     Tilt Bed    Modified Rankin (Stroke Patients Only)       Balance Overall balance assessment: Needs assistance Sitting-balance support: Feet supported, No upper extremity supported Sitting balance-Leahy Scale: Fair Sitting balance - Comments: fair/good seated at EOB   Standing balance support: During functional activity, No upper extremity supported Standing balance-Leahy Scale: Poor Standing balance comment: fair/good using RW  Communication Communication Communication: No apparent difficulties  Cognition Arousal: Alert Behavior During Therapy: WFL for tasks assessed/performed   PT - Cognitive impairments: No apparent impairments                         Following commands: Intact      Cueing     Exercises General Exercises - Lower Extremity Long Arc Quad: Seated, AROM, Strengthening, Both, 10 reps Hip Flexion/Marching: Seated, AROM, Strengthening, Both, 10 reps Toe Raises: Seated, AROM, Strengthening, Both, 10 reps Heel Raises: Seated, AROM, Strengthening, Both, 10 reps    General Comments        Pertinent Vitals/Pain Pain Assessment Pain Assessment: No/denies pain    Home Living                          Prior Function            PT Goals (current goals can now be found in the care plan section) Acute Rehab PT Goals Patient Stated Goal: return home after rehab Progress towards PT goals: Progressing toward goals    Frequency    Min 3X/week      PT Plan      Co-evaluation              AM-PAC PT 6 Clicks Mobility   Outcome Measure  Help needed turning from your back to your side while in a flat bed without using bedrails?: A Little Help needed moving from lying on your back to sitting on the side of a flat bed without using bedrails?: A Little Help needed moving to and from a bed to a chair (including a wheelchair)?: A Little Help needed standing up from a chair using your arms (e.g., wheelchair or bedside chair)?: A Little Help needed to walk in hospital room?: A Lot Help needed climbing 3-5 steps with a railing? : A Lot 6 Click Score: 16    End of Session   Activity Tolerance: Patient tolerated treatment well;Patient limited by fatigue Patient left: in chair;with call bell/phone within reach Nurse Communication: Mobility status PT Visit Diagnosis: Unsteadiness on feet (R26.81);Other abnormalities of gait and mobility (R26.89);Muscle weakness (generalized) (M62.81)     Time: 8861-8798 PT Time Calculation (min) (ACUTE ONLY): 23 min  Charges:    $Gait Training: 8-22 mins $Therapeutic Exercise: 8-22 mins PT General Charges $$ ACUTE PT VISIT: 1 Visit                     12:19 PM, 10/19/24 Lynwood Music, MPT Physical  Therapist with [facility] 336 [phone] office [phone] mobile phone

## 2024-10-19 NOTE — Progress Notes (Signed)
 PROGRESS NOTE   Donald Stewart  FMW:[id number] DOB: 07-29-1971 DOA: 10/10/2024 PCP: [name], MD   Chief Complaint  Patient presents with   Hypoglycemia   Loss of Consciousness   Level of care: Med-Surg  Brief Admission History:   53 y.o. male with medical history significant alcohol and tobacco abuse, asthma/COPD, avascular necrosis of the right hip and HLD presents to the ED unresponsive.- Found to have blood sugar of 15 in the setting of heavy alcohol use over the last couple days as per family members.  -EMS gave patient IM glucagon due to inability to obtain IV access initially.  - IV dextrose  subsequently given.   -Patient had very similar presentation back in December 2024 with heavy alcohol use and hypoglycemia with altered mentation. No fevers noted, no vomiting or diarrhea -No reported falls or head injury -Chest x-ray and CT head without acute findings -UA with ketones and protein otherwise negative -UDS negative   Assessment and Plan:  Delirium Tremens -- resolved now   Acute metabolic encephalopathy - resolved -- secondary to severe life threatening hypoglycemia -- CT head negative for acute findings -- UDS negative  -- suspect underlying Wernicke's disease from chronic alcohol abuse -- Continue vitamin supplementation as ordered  Essential hypertension -- stopped daily amlodipine  5 mg due to soft BPs  Hypokalemia Hypomagnesemia -- IV magnesium  and potassium ordered and repleted  Macrocytic anemia -- stable Hg -- b12 714, folate 8.5 are both reassuring  Severe Hypoglycemia -- from chronic alcohol abuse, poor nutrition -- Blood sugars have been stable for days, DC CBG testing CBG (last 3)  Recent Labs    10/19/24 0004 10/19/24 0415 10/19/24 0740  GLUCAP 103* 103* 131*   AKI -- resolved with supportive measures  Thrombocytopenia - improved to normal  -- from chronic alcohol abuse -- stable, no bleeding seen  Adult failure to  thrive -- TOC working on SNF rehab placement  -- he will need ongoing alcohol abuse treatment and absolute alcohol cessation   DVT prophylaxis: SCDs Code Status: Full  Family Communication: mother phone, spouse bedside Disposition: SNF    Consultants:   Procedures:   Antimicrobials:    Subjective: No specific complaints.   Objective: Vitals:   10/18/24 1930 10/19/24 0414 10/19/24 1302 10/19/24 1308  BP: 94/76 100/62 (!) 83/64 (!) 92/58  Pulse: 90 92 98   Resp: 18 18 17    Temp: 98.5 F (36.9 C) 98.6 F (37 C) 98.2 F (36.8 C)   TempSrc: Oral Oral Oral   SpO2: 98% 96% 99%   Weight:      Height:        Intake/Output Summary (Last 24 hours) at 10/19/2024 1539 Last data filed at 10/19/2024 1259 Gross per 24 hour  Intake 726 ml  Output 1450 ml  Net -724 ml   Filed Weights   10/10/24 1038  Weight: 52.2 kg   Examination:  General exam: He appears cachectic. Appears calm and comfortable.   Respiratory system: Clear to auscultation. Respiratory effort normal. Cardiovascular system: normal S1 & S2 heard. No JVD, murmurs, rubs, gallops or clicks. No pedal edema. Gastrointestinal system: Abdomen is nondistended, soft and nontender. No organomegaly or masses felt. Normal bowel sounds heard. Central nervous system: Alert and disoriented. No focal neurological deficits. Extremities: Symmetric 5 x 5 power. Skin: No rashes, lesions or ulcers. Psychiatry: Judgement and insight appear poor. Mood & affect appropriate.   Data Reviewed: I have personally reviewed following labs and imaging studies  CBC: Recent Labs  Lab 10/14/24 0326 10/15/24 0436 10/16/24 0423  WBC 6.1 7.5 7.1  HGB 10.3* 10.0* 9.3*  HCT 30.5* 29.4* 27.7*  MCV 104.8* 105.8* 106.1*  PLT 135* 163 207    Basic Metabolic Panel: Recent Labs  Lab 10/14/24 0326 10/15/24 0436 10/16/24 0423 10/18/24 0406  NA 139 136 138  --   K 3.0* 3.9 3.7  --   CL 99 99 100  --   CO2 31 28 28   --   GLUCOSE 99 92 91   --   BUN 5* 10 12  --   CREATININE 0.62 0.69 0.67  --   CALCIUM 8.9 9.2 9.3  --   MG 1.6* 2.2 1.8 1.8  PHOS 3.1  --   --   --     CBG: Recent Labs  Lab 10/18/24 1700 10/18/24 1933 10/19/24 0004 10/19/24 0415 10/19/24 0740  GLUCAP 106* 119* 103* 103* 131*    Recent Results (from the past 240 hours)  MRSA Next Gen by PCR, Nasal     Status: None   Collection Time: 10/10/24 11:30 AM   Specimen: Nasal Mucosa; Nasal Swab  Result Value Ref Range Status   MRSA by PCR Next Gen NOT DETECTED NOT DETECTED Final    Comment: (NOTE) The GeneXpert MRSA Assay (FDA approved for NASAL specimens only), is one component of a comprehensive MRSA colonization surveillance program. It is not intended to diagnose MRSA infection nor to guide or monitor treatment for MRSA infections. Test performance is not FDA approved in patients less than 72 years old. Performed at [facility], [address], [place], [place] [address]     Radiology Studies: No results found.  Scheduled Meds:  Chlorhexidine  Gluconate Cloth  6 each Topical Daily   feeding supplement  237 mL Oral BID BM   folic acid   1 mg Oral Daily   heparin   5,000 Units Subcutaneous Q8H   multivitamin with minerals  1 tablet Oral Daily   nicotine   21 mg Transdermal Daily   sodium chloride  flush  3 mL Intravenous Q12H   sodium chloride  flush  3 mL Intravenous Q12H   thiamine   100 mg Oral Daily   Continuous Infusions:   LOS: 9 days   Time spent: 55 mins  Barb Shear Vicci, MD How to contact the [facility] Attending or Consulting provider 7A - 7P or covering provider during after hours 7P -7A, for this patient?  Check the care team in [facility] and look for a) attending/consulting TRH provider listed and b) the TRH team listed Log into [email or web address] to find provider on call.  Locate the TRH provider you are looking for under [facility] and page to a number that you can be directly reached. If you still have difficulty reaching the provider,  please page the [facility] (Director on Call) for the Hospitalists listed on amion for assistance.  10/19/2024, 3:39 PM

## 2024-10-19 NOTE — TOC Progression Note (Signed)
 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 Per Marval they are requesting new PT notes. CM updated our therapist that new PT note is needed. IPCM following.     Expected Discharge Plan: Skilled Nursing Facility Barriers to Discharge: Continued Medical Work up, English As A Second Language Teacher               Expected Discharge Plan and Services In-house Referral: Clinical Social Work Discharge Planning Services: [employer] Consult Post Acute Care Choice: Skilled Nursing Facility Living arrangements for the past 2 months: Single Family Home                                       Social Drivers of Health (SDOH) Interventions SDOH Screenings   Food Insecurity: Patient Unable To Answer (10/14/2024)  Housing: Patient Unable To Answer (10/14/2024)  Transportation Needs: Patient Unable To Answer (10/14/2024)  Utilities: Patient Unable To Answer (10/14/2024)  Depression (PHQ2-9): Low Risk  (07/30/2024)  Tobacco Use: High Risk (10/10/2024)    Readmission Risk Interventions     No data to display

## 2024-10-20 LAB — URINALYSIS, ROUTINE W REFLEX MICROSCOPIC
Bilirubin Urine: NEGATIVE
Glucose, UA: NEGATIVE mg/dL
Hgb urine dipstick: NEGATIVE
Ketones, ur: NEGATIVE mg/dL
Nitrite: POSITIVE — AB
Protein, ur: NEGATIVE mg/dL
Specific Gravity, Urine: 1.015 (ref 1.005–1.030)
WBC, UA: >50 WBC/hpf (ref 0–5)
pH: 6 (ref 5.0–8.0)

## 2024-10-20 NOTE — Plan of Care (Signed)

## 2024-10-20 NOTE — Plan of Care (Signed)
   Problem: Education: Goal: Knowledge of General Education information will improve Description Including pain rating scale, medication(s)/side effects and non-pharmacologic comfort measures Outcome: Progressing   Problem: Health Behavior/Discharge Planning: Goal: Ability to manage health-related needs will improve Outcome: Progressing

## 2024-10-20 NOTE — Progress Notes (Signed)
 PROGRESS NOTE   Donald Stewart  FMW:[id number] DOB: [birth year] DOA: 10/10/2024 PCP: [name], MD   Chief Complaint  Patient presents with   Hypoglycemia   Loss of Consciousness   Level of care: Med-Surg  Brief Admission History:   53 y.o. male with medical history significant alcohol and tobacco abuse, asthma/COPD, avascular necrosis of the right hip and HLD presents to the ED unresponsive.- Found to have blood sugar of 15 in the setting of heavy alcohol use over the last couple days as per family members.  -EMS gave patient IM glucagon due to inability to obtain IV access initially.  - IV dextrose  subsequently given.   -Patient had very similar presentation back in December 2024 with heavy alcohol use and hypoglycemia with altered mentation. No fevers noted, no vomiting or diarrhea -No reported falls or head injury -Chest x-ray and CT head without acute findings -UA with ketones and protein otherwise negative -UDS negative   Assessment and Plan:  Delirium Tremens -- resolved now   Acute metabolic encephalopathy - resolved -- secondary to severe life threatening hypoglycemia -- CT head negative for acute findings -- UDS negative  -- suspect underlying Wernicke's disease from chronic alcohol abuse -- Continue vitamin supplementation as ordered  Essential hypertension -- stopped daily amlodipine  5 mg due to soft BPs  Hypokalemia Hypomagnesemia -- IV magnesium  and potassium ordered and repleted  Macrocytic anemia -- stable Hg -- b12 714, folate 8.5 are both reassuring  Severe Hypoglycemia -- from chronic alcohol abuse, poor nutrition -- Blood sugars have been stable for days, DC CBG testing CBG (last 3)  Recent Labs    10/19/24 0004 10/19/24 0415 10/19/24 0740  GLUCAP 103* 103* 131*   AKI -- resolved with supportive measures  Thrombocytopenia - improved to normal  -- from chronic alcohol abuse -- stable, no bleeding seen  Adult failure to  thrive -- TOC working on SNF rehab placement  -- he will need ongoing alcohol abuse treatment and absolute alcohol cessation   DVT prophylaxis: SCDs Code Status: Full  Family Communication: mother phone, spouse bedside Disposition: SNF    Consultants:   Procedures:   Antimicrobials:    Subjective: He asking about when he is going to be discharged!   Objective: Vitals:   10/19/24 1634 10/19/24 1936 10/20/24 0500 10/20/24 1259  BP: 97/71 96/64 105/68 92/77  Pulse: 75 80 97 96  Resp: 16 20 20 18   Temp:  98.5 F (36.9 C) 98.1 F (36.7 C) 98.4 F (36.9 C)  TempSrc:  Oral Oral Oral  SpO2: 100% 98% 97% 100%  Weight:      Height:        Intake/Output Summary (Last 24 hours) at 10/20/2024 1411 Last data filed at 10/20/2024 1300 Gross per 24 hour  Intake 720 ml  Output 900 ml  Net -180 ml   Filed Weights   10/10/24 1038  Weight: 52.2 kg   Examination:  General exam: He appears cachectic. Appears calm and comfortable.   Respiratory system: Clear to auscultation. Respiratory effort normal. Cardiovascular system: normal S1 & S2 heard. No JVD, murmurs, rubs, gallops or clicks. No pedal edema. Gastrointestinal system: Abdomen is nondistended, soft and nontender. No organomegaly or masses felt. Normal bowel sounds heard. Central nervous system: Alert and disoriented. No focal neurological deficits. Extremities: Symmetric 5 x 5 power. Skin: No rashes, lesions or ulcers. Psychiatry: Judgement and insight appear poor. Mood & affect appropriate.   Data Reviewed: I have personally reviewed following  labs and imaging studies  CBC: Recent Labs  Lab 10/14/24 0326 10/15/24 0436 10/16/24 0423  WBC 6.1 7.5 7.1  HGB 10.3* 10.0* 9.3*  HCT 30.5* 29.4* 27.7*  MCV 104.8* 105.8* 106.1*  PLT 135* 163 207    Basic Metabolic Panel: Recent Labs  Lab 10/14/24 0326 10/15/24 0436 10/16/24 0423 10/18/24 0406  NA 139 136 138  --   K 3.0* 3.9 3.7  --   CL 99 99 100  --   CO2 31 28  28   --   GLUCOSE 99 92 91  --   BUN 5* 10 12  --   CREATININE 0.62 0.69 0.67  --   CALCIUM 8.9 9.2 9.3  --   MG 1.6* 2.2 1.8 1.8  PHOS 3.1  --   --   --     CBG: Recent Labs  Lab 10/18/24 1700 10/18/24 1933 10/19/24 0004 10/19/24 0415 10/19/24 0740  GLUCAP 106* 119* 103* 103* 131*    No results found for this or any previous visit (from the past 240 hours).   Radiology Studies: No results found.  Scheduled Meds:  Chlorhexidine  Gluconate Cloth  6 each Topical Daily   feeding supplement  237 mL Oral BID BM   folic acid   1 mg Oral Daily   heparin   5,000 Units Subcutaneous Q8H   multivitamin with minerals  1 tablet Oral Daily   nicotine   21 mg Transdermal Daily   sodium chloride  flush  3 mL Intravenous Q12H   sodium chloride  flush  3 mL Intravenous Q12H   thiamine   100 mg Oral Daily   Continuous Infusions:   LOS: 10 days   Time spent: 35 mins  Donald Shuford Vicci, MD How to contact the [facility] Attending or Consulting provider 7A - 7P or covering provider during after hours 7P -7A, for this patient?  Check the care team in [facility] and look for a) attending/consulting TRH provider listed and b) the TRH team listed Log into [email or web address] to find provider on call.  Locate the TRH provider you are looking for under [facility] and page to a number that you can be directly reached. If you still have difficulty reaching the provider, please page the [facility] (Director on Call) for the Hospitalists listed on amion for assistance.  10/20/2024, 2:11 PM

## 2024-10-20 NOTE — Progress Notes (Signed)
 Mobility Specialist Progress Note:    10/20/24 0850  Mobility  Activity Ambulated with assistance  Level of Assistance Contact guard assist, steadying assist  Assistive Device None  Distance Ambulated (ft) 40 ft  Range of Motion/Exercises Active;All extremities  Activity Response Tolerated well  Mobility Referral Yes  Mobility visit 1 Mobility  Mobility Specialist Start Time (ACUTE ONLY) 0850  Mobility Specialist Stop Time (ACUTE ONLY) 0910  Mobility Specialist Time Calculation (min) (ACUTE ONLY) 20 min   Pt received in chair, agreeable to mobility. Required CGA to stand and ambulate with no AD. Tolerated well, asx throughout. Returned to chair, CHARITY FUNDRAISER in room. All needs met.  Cylie Dor Mobility Specialist Please contact via Special Educational Needs Teacher or  Rehab office at [phone]

## 2024-10-21 DIAGNOSIS — N179 Acute kidney failure, unspecified: Secondary | ICD-10-CM

## 2024-10-21 DIAGNOSIS — F10931 Alcohol use, unspecified with withdrawal delirium: Secondary | ICD-10-CM

## 2024-10-21 DIAGNOSIS — G9341 Metabolic encephalopathy: Secondary | ICD-10-CM

## 2024-10-21 MED ORDER — VITAMIN B-1 100 MG PO TABS: 100.0000 mg | ORAL_TABLET | Freq: Every day | ORAL | Status: AC

## 2024-10-21 MED ORDER — SODIUM CHLORIDE 0.9 % IV SOLN
1.0000 g | Freq: Once | INTRAVENOUS | Status: AC
  Administered 2024-10-21: 1 g via INTRAVENOUS
  Filled 2024-10-21: qty 10

## 2024-10-21 MED ORDER — ADULT MULTIVITAMIN W/MINERALS CH: 1.0000 | ORAL_TABLET | Freq: Every day | ORAL | Status: AC

## 2024-10-21 MED ORDER — CEFADROXIL 500 MG PO CAPS: 500.0000 mg | ORAL_CAPSULE | Freq: Two times a day (BID) | ORAL | Status: DC

## 2024-10-21 NOTE — Plan of Care (Signed)
   Problem: Education: Goal: Knowledge of General Education information will improve Description Including pain rating scale, medication(s)/side effects and non-pharmacologic comfort measures Outcome: Progressing

## 2024-10-21 NOTE — Discharge Summary (Signed)
 Physician Discharge Summary   Patient: Donald Stewart MRN: [id number] DOB: 05-01-1971  Admit date:     10/10/2024  Discharge date: 10/21/24  Discharge Physician: Alm Daryll Spisak   PCP: [name], MD   Recommendations at discharge:   Please follow up with primary care provider within 1-2 weeks  Please repeat BMP and CBC in one week    Hospital Course:  53 y.o. male with medical history significant alcohol and tobacco abuse, asthma/COPD, avascular necrosis of the right hip and HLD presents to the ED unresponsive.- Found to have blood sugar of 15 in the setting of heavy alcohol use over the last couple days as per family members.  -EMS gave patient IM glucagon due to inability to obtain IV access initially.  - IV dextrose  subsequently given.   -Patient had very similar presentation back in December 2024 with heavy alcohol use and hypoglycemia with altered mentation. No fevers noted, no vomiting or diarrhea -No reported falls or head injury -Chest x-ray and CT head without acute findings -initial UA with ketones and protein otherwise negative -UDS negative  Assessment and Plan: Delirium Tremens -- resolved now    Acute metabolic encephalopathy - resolved -- secondary to severe life threatening hypoglycemia -- CT head negative for acute findings -- UDS negative  -- suspect underlying Wernicke's disease from chronic alcohol abuse -- Continue vitamin supplementation as ordered - Continue daily thiamine    Essential hypertension -- stopped daily amlodipine  5 mg due to soft BPs -BP remains stable off of antihypertensive medications   Hypokalemia Hypomagnesemia -- IV magnesium  and potassium ordered and repleted -Repeat BMP in 1 week after discharge   Macrocytic anemia -- stable Hg -- b12 714, folate 8.5 are both reassuring   Severe Hypoglycemia -- from chronic alcohol abuse, poor nutrition -- Blood sugars have been stable for days, DC CBG testing - CBGs controlled  without any further hypoglycemia  AKI -- resolved with supportive measures -Serum creatinine peaked to 3.30 -Baseline creatinine 0.6-0.8   Thrombocytopenia - improved to normal  -- from chronic alcohol abuse -- stable, no bleeding seen -No signs of active bleeding -Overall improved   Adult failure to thrive -- TOC working on SNF rehab placement  -- he will need ongoing alcohol abuse treatment and absolute alcohol cessation  - 10/20/2024 UA with > 50 WBC -treat with 4 more days cefadroxil empirically      Consultants: none Procedures performed: none  Disposition: Skilled nursing facility Diet recommendation:  Regular diet DISCHARGE MEDICATION: Allergies as of 10/21/2024   No Known Allergies      Medication List     STOP taking these medications    amLODipine  5 MG tablet Commonly known as: NORVASC    fluticasone  50 MCG/ACT nasal spray Commonly known as: FLONASE    Norel AD 4-10-325 MG Tabs Generic drug: Chlorphen-PE-Acetaminophen    potassium chloride  SA 20 MEQ tablet Commonly known as: KLOR-CON  M       TAKE these medications    albuterol  108 (90 Base) MCG/ACT inhaler Commonly known as: VENTOLIN  HFA Inhale 2 puffs into the lungs every 6 (six) hours as needed for wheezing or shortness of breath.   cefadroxil 500 MG capsule Commonly known as: DURICEF Take 1 capsule (500 mg total) by mouth 2 (two) times daily. X 4 days Start taking on: October 22, 2024   folic acid  1 MG tablet Commonly known as: FOLVITE  Take 1 tablet (1 mg total) by mouth daily.   multivitamin with minerals Tabs tablet Take 1  tablet by mouth daily. Start taking on: October 22, 2024   thiamine  100 MG tablet Commonly known as: Vitamin B-1 Take 1 tablet (100 mg total) by mouth daily. Start taking on: October 22, 2024        Contact information for after-discharge care     Destination     [facility] .   Service: Skilled  Nursing Contact information: [address] [place]  [address] [phone]                    Discharge Exam: Donald Stewart   10/10/24 1038  Weight: 52.2 kg   HEENT:  [place]/AT, No thrush, no icterus CV:  RRR, no rub, no S3, no S4 Lung:  CTA, no wheeze, no rhonchi Abd:  soft/+BS, NT Ext:  No edema, no lymphangitis, no synovitis, no rash   Condition at discharge: stable  The results of significant diagnostics from this hospitalization (including imaging, microbiology, ancillary and laboratory) are listed below for reference.   Imaging Studies: CT Head Wo Contrast Result Date: 10/10/2024 EXAM: CT Head Without TECHNIQUE: CT of the head was performed without the administration of intravenous contrast. Automated exposure control, iterative reconstruction, and/or weight-based adjustment of the mA/kV was utilized to reduce the radiation dose to as low as reasonably achievable. COMPARISON: CT Head dated 08/04/2020. CLINICAL HISTORY: Mental status change, alcohol/drug use. FINDINGS: BRAIN AND VENTRICLES: Stable age-related cerebral and cerebellar atrophy. No acute intracranial hemorrhage. No mass effect or midline shift. No extra-axial fluid collection. No evidence of acute infarct. No hydrocephalus. ORBITS: No acute abnormality. SINUSES AND MASTOIDS: No acute abnormality. SOFT TISSUES AND SKULL: No acute skull fracture. No acute soft tissue abnormality. IMPRESSION: 1. No acute intracranial abnormality. 2. Stable age-advanced cerebral and cerebellar atrophy. Electronically signed by: Norleen Kil MD 10/10/2024 10:08 AM EST RP Workstation: [id number]   DG Chest Portable 1 View Result Date: 10/10/2024 EXAM: 1 View Xray Of The Chest 10/10/2024 09:15:58 Am COMPARISON: 11/05/2023 CLINICAL HISTORY: Ams FINDINGS: LUNGS AND PLEURA: No focal pulmonary opacity. No pleural effusion. No pneumothorax. HEART AND MEDIASTINUM: No acute abnormality of the cardiac and mediastinal silhouettes.  BONES AND SOFT TISSUES: No acute osseous abnormality. IMPRESSION: 1. No acute process. Electronically signed by: Norleen Kil MD 10/10/2024 10:05 AM EST RP Workstation: [id number]    Microbiology: Results for orders placed or performed during the hospital encounter of 10/10/24  MRSA Next Gen by PCR, Nasal     Status: None   Collection Time: 10/10/24 11:30 AM   Specimen: Nasal Mucosa; Nasal Swab  Result Value Ref Range Status   MRSA by PCR Next Gen NOT DETECTED NOT DETECTED Final    Comment: (NOTE) The GeneXpert MRSA Assay (FDA approved for NASAL specimens only), is one component of a comprehensive MRSA colonization surveillance program. It is not intended to diagnose MRSA infection nor to guide or monitor treatment for MRSA infections. Test performance is not FDA approved in patients less than 39 years old. Performed at [facility], [address], [place], [place] [address]     Labs: CBC: Recent Labs  Lab 10/15/24 0436 10/16/24 0423  WBC 7.5 7.1  HGB 10.0* 9.3*  HCT 29.4* 27.7*  MCV 105.8* 106.1*  PLT 163 207   Basic Metabolic Panel: Recent Labs  Lab 10/15/24 0436 10/16/24 0423 10/18/24 0406  NA 136 138  --   K 3.9 3.7  --   CL 99 100  --   CO2 28 28  --  GLUCOSE 92 91  --   BUN 10 12  --   CREATININE 0.69 0.67  --   CALCIUM 9.2 9.3  --   MG 2.2 1.8 1.8   Liver Function Tests: No results for input(s): AST, ALT, ALKPHOS, BILITOT, PROT, ALBUMIN in the last 168 hours. CBG: Recent Labs  Lab 10/18/24 1700 10/18/24 1933 10/19/24 0004 10/19/24 0415 10/19/24 0740  GLUCAP 106* 119* 103* 103* 131*    Discharge time spent: greater than 30 minutes.  Signed: Alm Schneider, MD [facility] 10/21/2024

## 2024-10-21 NOTE — Progress Notes (Signed)
 Report called to [facility] LPN at [facility]. [phone], room B20-2. Patients mother is providing transportation to [facility].

## 2024-10-21 NOTE — TOC Transition Note (Signed)
 Transition of Care [facility]) - Discharge Note   Patient Details  Name: Donald Stewart MRN: [id number] Date of Birth: 09-15-1971  Transition of Care [facility]) CM/SW Contact:  Sharlyne Stabs, RN Phone Number: 10/21/2024, 1:22 PM   Clinical Narrative:  Shara received for [facility], Debbie provided room number, RN calling report. Patient is ambulatory, nurse reported he could go in wheelchair van.  CM at bedside to discuss new policy. It will be $75 wheelchair [employer]. Patient states he can not pay that and will call his family to transport. CM confirmed with Marval that is acceptable. Updated 300 manager and RN with discharge plan. This will assist family to get patient in the car.    Final next level of care: Skilled Nursing Facility,  Barriers to Discharge: Barriers Resolved   Patient Goals and CMS Choice Patient states their goals for this hospitalization and ongoing recovery are:: agreeable to SNF CMS Medicare.gov Compare Post Acute Care list provided to:: Patient Choice offered to / list presented to : Patient [facility] ownership interest in [facility].provided to:: Patient    Discharge Placement               Patient to be transferred to facility by: Family   Patient and family notified of of transfer: 10/21/24  Discharge Plan and Services Additional resources added to the After Visit Summary for   In-house Referral: Clinical Social Work Discharge Planning Services: CM Consult Post Acute Care Choice: Skilled Nursing Facility                 Social Drivers of Health (SDOH) Interventions SDOH Screenings   Food Insecurity: Patient Unable To Answer (10/14/2024)  Housing: Unknown (10/20/2024)  Transportation Needs: Patient Unable To Answer (10/14/2024)  Utilities: Patient Unable To Answer (10/14/2024)  Depression (PHQ2-9): Low Risk  (07/30/2024)  Tobacco Use: High Risk (10/10/2024)     Readmission Risk Interventions    10/21/2024    1:22 PM   Readmission Risk Prevention Plan  Post Dischage Appt Complete  Medication Screening Complete  Transportation Screening Complete

## 2024-10-21 NOTE — Progress Notes (Signed)
 Physical Therapy Treatment Patient Details Name: Donald Stewart MRN: [id number] DOB: [birth year] Today's Date: 10/21/2024   History of Present Illness Donald Stewart is a 53 y.o. male with medical history significant alcohol and tobacco abuse, asthma/COPD, avascular necrosis of the right hip and HLD presents to the ED unresponsive.-  Found to have blood sugar of 15 in the setting of heavy alcohol use over the last couple days as per family members  -EMS gave patient IM glucagon due to inability to obtain IV access initially  =-IV dextrose  subsequently given  -Patient had very similar presentation back in December 2024 with heavy alcohol use and hypoglycemia with altered mentation  .  No fevers noted, no vomiting or diarrhea  -No reported falls or head injury  -Chest x-ray and CT head without acute findings  - UA with ketones and protein otherwise negative  - UDS negative    PT Comments  Patient agreeable to PT treatment session. Patient had a visitor in room during session. Patient was received in bed at beginning of session. Required min assist for supine to sit. Once seated EOB, assisted pt with changing gown. Pt demonstrates improved seated balance. Followed with LE strengthening exercises. Progressed to standing exercises this date with pt tolerating well with no reports of pain or dizziness. Pt ambulates in room and some in hall with RW, required verbal cueing for step lengths and trunk posture throughout. Limited mostly due to inc fatigue. Pt tolerates sitting in recliner at end of session, all needs met, with friend and nursing staff present. Patient will benefit from continued skilled physical therapy acutely and in recommended venue in order to continue progress towards goals and independence.     If plan is discharge home, recommend the following: A little help with bathing/dressing/bathroom;A little help with walking and/or transfers;Assistance with cooking/housework;Assist for  transportation;Help with stairs or ramp for entrance   Can travel by private vehicle        Equipment Recommendations  None recommended by PT    Recommendations for Other Services       Precautions / Restrictions Precautions Precautions: Fall Recall of Precautions/Restrictions: Intact Restrictions Weight Bearing Restrictions Per Provider Order: No     Mobility  Bed Mobility Overal bed mobility: Needs Assistance Bed Mobility: Supine to Sit     Supine to sit: Min assist     General bed mobility comments: pt continues to demo slow labored movement, requires some assist at LE and trunk for stability    Transfers Overall transfer level: Needs assistance Equipment used: Rolling walker (2 wheels) Transfers: Sit to/from Stand Sit to Stand: Contact guard assist, Min assist           General transfer comment: Pt completes 5 STS from bed, using RW for stability, cont to demo unsteadiness req CGA/min A at times and slow labored movement    Ambulation/Gait Ambulation/Gait assistance: Contact guard assist, Min assist Gait Distance (Feet): 45 Feet Assistive device: Rolling walker (2 wheels) Gait Pattern/deviations: Decreased step length - right, Decreased step length - left, Decreased stride length Gait velocity: Dec     General Gait Details: pt ambulates in room and some in hall with RW, and CGA due to unsteadiness, demo dec step lengths bilaterally and forward posture, improves with verbal cueing, pt limited due to fatigue   Stairs             Wheelchair Mobility     Tilt Bed    Modified Rankin (Stroke Patients Only)  Balance Overall balance assessment: Needs assistance Sitting-balance support: Feet supported, No upper extremity supported Sitting balance-Leahy Scale: Fair Sitting balance - Comments: fair/good seated at EOB   Standing balance support: During functional activity, No upper extremity supported Standing balance-Leahy Scale:  Fair Standing balance comment: w/ RW                            Communication Communication Communication: No apparent difficulties  Cognition Arousal: Alert Behavior During Therapy: WFL for tasks assessed/performed   PT - Cognitive impairments: No apparent impairments                         Following commands: Intact      Cueing Cueing Techniques: Verbal cues  Exercises General Exercises - Lower Extremity Hip Flexion/Marching: AROM, Strengthening, Both, 5 reps, Standing, Other (comment) (w/ RW for stability) Other Exercises Other Exercises: 5 STS from bed with RW, for LE strengthening Other Exercises: Standing hip extensions, w/ RW for stability, 5 reps each LE, for LE strengthening    General Comments        Pertinent Vitals/Pain Pain Assessment Pain Assessment: No/denies pain    Home Living                          Prior Function            PT Goals (current goals can now be found in the care plan section) Acute Rehab PT Goals Patient Stated Goal: return home after rehab Progress towards PT goals: Progressing toward goals    Frequency    Min 3X/week      PT Plan      Co-evaluation              AM-PAC PT 6 Clicks Mobility   Outcome Measure  Help needed turning from your back to your side while in a flat bed without using bedrails?: A Little Help needed moving from lying on your back to sitting on the side of a flat bed without using bedrails?: A Little Help needed moving to and from a bed to a chair (including a wheelchair)?: A Little Help needed standing up from a chair using your arms (e.g., wheelchair or bedside chair)?: A Little Help needed to walk in hospital room?: A Little Help needed climbing 3-5 steps with a railing? : A Lot 6 Click Score: 17    End of Session Equipment Utilized During Treatment: Gait belt Activity Tolerance: Patient tolerated treatment well;Patient limited by fatigue Patient  left: in chair;with call bell/phone within reach;with nursing/sitter in room;with family/visitor present Nurse Communication: Mobility status PT Visit Diagnosis: Unsteadiness on feet (R26.81);Other abnormalities of gait and mobility (R26.89);Muscle weakness (generalized) (M62.81)     Time: 0941-1000 PT Time Calculation (min) (ACUTE ONLY): 19 min  Charges:    $Therapeutic Exercise: 8-22 mins PT General Charges $$ ACUTE PT VISIT: 1 Visit                     12:04 PM, 10/21/24 Donald Stewart, PT, DPT [place] with [facility]

## 2024-11-05 ENCOUNTER — Emergency Department (HOSPITAL_COMMUNITY)

## 2024-11-05 ENCOUNTER — Encounter (HOSPITAL_COMMUNITY): Payer: Self-pay | Admitting: *Deleted

## 2024-11-05 ENCOUNTER — Other Ambulatory Visit: Payer: Self-pay

## 2024-11-05 ENCOUNTER — Telehealth (HOSPITAL_BASED_OUTPATIENT_CLINIC_OR_DEPARTMENT_OTHER): Payer: Self-pay | Admitting: Pulmonary Disease

## 2024-11-05 ENCOUNTER — Inpatient Hospital Stay (HOSPITAL_COMMUNITY)
Admission: EM | Admit: 2024-11-05 | Discharge: 2024-11-11 | DRG: 871 | Disposition: A | Discharge status: Home / Self Care | Attending: Internal Medicine | Admitting: Internal Medicine

## 2024-11-05 DIAGNOSIS — E7800 Pure hypercholesterolemia, unspecified: Secondary | ICD-10-CM | POA: Diagnosis present

## 2024-11-05 DIAGNOSIS — A419 Sepsis, unspecified organism: Principal | ICD-10-CM | POA: Diagnosis present

## 2024-11-05 DIAGNOSIS — J449 Chronic obstructive pulmonary disease, unspecified: Secondary | ICD-10-CM | POA: Diagnosis present

## 2024-11-05 DIAGNOSIS — J189 Pneumonia, unspecified organism: Secondary | ICD-10-CM | POA: Diagnosis present

## 2024-11-05 DIAGNOSIS — K709 Alcoholic liver disease, unspecified: Secondary | ICD-10-CM | POA: Diagnosis present

## 2024-11-05 DIAGNOSIS — R262 Difficulty in walking, not elsewhere classified: Secondary | ICD-10-CM | POA: Diagnosis present

## 2024-11-05 DIAGNOSIS — I10 Essential (primary) hypertension: Secondary | ICD-10-CM | POA: Diagnosis present

## 2024-11-05 DIAGNOSIS — Z96641 Presence of right artificial hip joint: Secondary | ICD-10-CM | POA: Diagnosis present

## 2024-11-05 DIAGNOSIS — Z79899 Other long term (current) drug therapy: Secondary | ICD-10-CM | POA: Diagnosis not present

## 2024-11-05 DIAGNOSIS — Z87891 Personal history of nicotine dependence: Secondary | ICD-10-CM | POA: Diagnosis not present

## 2024-11-05 DIAGNOSIS — J850 Gangrene and necrosis of lung: Secondary | ICD-10-CM | POA: Diagnosis present

## 2024-11-05 DIAGNOSIS — Z8249 Family history of ischemic heart disease and other diseases of the circulatory system: Secondary | ICD-10-CM | POA: Diagnosis not present

## 2024-11-05 DIAGNOSIS — J440 Chronic obstructive pulmonary disease with acute lower respiratory infection: Secondary | ICD-10-CM | POA: Diagnosis present

## 2024-11-05 LAB — COMPREHENSIVE METABOLIC PANEL WITH GFR
ALT: 29 U/L (ref 0–44)
AST: 42 U/L — ABNORMAL HIGH (ref 15–41)
Albumin: 4.2 g/dL (ref 3.5–5.0)
Alkaline Phosphatase: 82 U/L (ref 38–126)
Anion gap: 18 — ABNORMAL HIGH (ref 5–15)
BUN: 13 mg/dL (ref 6–20)
CO2: 24 mmol/L (ref 22–32)
Calcium: 10.4 mg/dL — ABNORMAL HIGH (ref 8.9–10.3)
Chloride: 94 mmol/L — ABNORMAL LOW (ref 98–111)
Creatinine, Ser: 0.85 mg/dL (ref 0.61–1.24)
GFR, Estimated: >60 mL/min
Glucose, Bld: 94 mg/dL (ref 70–99)
Potassium: 4 mmol/L (ref 3.5–5.1)
Sodium: 136 mmol/L (ref 135–145)
Total Bilirubin: 1 mg/dL (ref 0.0–1.2)
Total Protein: 8.4 g/dL — ABNORMAL HIGH (ref 6.5–8.1)

## 2024-11-05 LAB — CBC WITH DIFFERENTIAL/PLATELET
Abs Immature Granulocytes: 0.15 K/uL — ABNORMAL HIGH (ref 0.00–0.07)
Basophils Absolute: 0.1 K/uL (ref 0.0–0.1)
Basophils Relative: 0 %
Eosinophils Absolute: 0 K/uL (ref 0.0–0.5)
Eosinophils Relative: 0 %
HCT: 30.5 % — ABNORMAL LOW (ref 39.0–52.0)
Hemoglobin: 10.1 g/dL — ABNORMAL LOW (ref 13.0–17.0)
Immature Granulocytes: 1 %
Lymphocytes Relative: 8 %
Lymphs Abs: 1.9 K/uL (ref 0.7–4.0)
MCH: 33 pg (ref 26.0–34.0)
MCHC: 33.1 g/dL (ref 30.0–36.0)
MCV: 99.7 fL (ref 80.0–100.0)
Monocytes Absolute: 2.9 K/uL — ABNORMAL HIGH (ref 0.1–1.0)
Monocytes Relative: 12 %
Neutro Abs: 19.5 K/uL — ABNORMAL HIGH (ref 1.7–7.7)
Neutrophils Relative %: 79 %
Platelets: 338 K/uL (ref 150–400)
RBC: 3.06 MIL/uL — ABNORMAL LOW (ref 4.22–5.81)
RDW: 13.9 % (ref 11.5–15.5)
WBC: 24.5 K/uL — ABNORMAL HIGH (ref 4.0–10.5)
nRBC: 0 % (ref 0.0–0.2)

## 2024-11-05 LAB — AMMONIA: Ammonia: 30 umol/L (ref 9–35)

## 2024-11-05 LAB — PROTIME-INR
INR: 1.1 (ref 0.8–1.2)
Prothrombin Time: 14.9 s (ref 11.4–15.2)

## 2024-11-05 LAB — LACTIC ACID, PLASMA
Lactic Acid, Venous: 1.5 mmol/L (ref 0.5–1.9)
Lactic Acid, Venous: 1.1 mmol/L (ref 0.5–1.9)

## 2024-11-05 LAB — URINALYSIS, ROUTINE W REFLEX MICROSCOPIC
Bilirubin Urine: NEGATIVE
Glucose, UA: NEGATIVE mg/dL
Hgb urine dipstick: NEGATIVE
Ketones, ur: NEGATIVE mg/dL
Leukocytes,Ua: NEGATIVE
Nitrite: NEGATIVE
Protein, ur: NEGATIVE mg/dL
Specific Gravity, Urine: 1.018 (ref 1.005–1.030)
pH: 5 (ref 5.0–8.0)

## 2024-11-05 LAB — RESP PANEL BY RT-PCR (RSV, FLU A&B, COVID)  RVPGX2
Influenza A by PCR: NEGATIVE
Influenza B by PCR: NEGATIVE
Resp Syncytial Virus by PCR: NEGATIVE
SARS Coronavirus 2 by RT PCR: NEGATIVE

## 2024-11-05 LAB — GLUCOSE, CAPILLARY: Glucose-Capillary: 105 mg/dL — ABNORMAL HIGH (ref 70–99)

## 2024-11-05 LAB — TROPONIN T, HIGH SENSITIVITY
Troponin T High Sensitivity: 27 ng/L — ABNORMAL HIGH (ref 0–19)
Troponin T High Sensitivity: 26 ng/L — ABNORMAL HIGH (ref 0–19)

## 2024-11-05 LAB — APTT: aPTT: 34 s (ref 24–36)

## 2024-11-05 MED ORDER — ONDANSETRON HCL 4 MG PO TABS: 4.0000 mg | ORAL_TABLET | Freq: Four times a day (QID) | ORAL | Status: DC | PRN

## 2024-11-05 MED ORDER — ONDANSETRON HCL 4 MG/2ML IJ SOLN: 4.0000 mg | Freq: Four times a day (QID) | INTRAMUSCULAR | Status: DC | PRN

## 2024-11-05 MED ORDER — THIAMINE HCL 100 MG/ML IJ SOLN
100.0000 mg | Freq: Every day | INTRAMUSCULAR | Status: DC
  Filled 2024-11-05: qty 2

## 2024-11-05 MED ORDER — IOHEXOL 350 MG/ML SOLN
100.0000 mL | Freq: Once | INTRAVENOUS | Status: AC | PRN
  Administered 2024-11-05: 100 mL via INTRAVENOUS

## 2024-11-05 MED ORDER — FOLIC ACID 1 MG PO TABS
1.0000 mg | ORAL_TABLET | Freq: Every day | ORAL | Status: DC
  Administered 2024-11-06 – 2024-11-11 (×6): 1 mg via ORAL
  Filled 2024-11-05 (×4): qty 1

## 2024-11-05 MED ORDER — CEFTRIAXONE SODIUM 1 G IJ SOLR
1.0000 g | Freq: Once | INTRAMUSCULAR | Status: AC
  Administered 2024-11-05: 1 g via INTRAMUSCULAR
  Filled 2024-11-05: qty 10

## 2024-11-05 MED ORDER — SODIUM CHLORIDE 0.9 % IV SOLN: 500.0000 mg | INTRAVENOUS | Status: DC

## 2024-11-05 MED ORDER — ENOXAPARIN SODIUM 40 MG/0.4ML IJ SOSY
40.0000 mg | PREFILLED_SYRINGE | INTRAMUSCULAR | Status: DC
  Administered 2024-11-06 – 2024-11-11 (×6): 40 mg via SUBCUTANEOUS
  Filled 2024-11-05 (×4): qty 0.4

## 2024-11-05 MED ORDER — POLYETHYLENE GLYCOL 3350 17 G PO PACK: 17.0000 g | PACK | Freq: Every day | ORAL | Status: DC | PRN

## 2024-11-05 MED ORDER — ACETAMINOPHEN 650 MG RE SUPP: 650.0000 mg | Freq: Four times a day (QID) | RECTAL | Status: DC | PRN

## 2024-11-05 MED ORDER — LACTATED RINGERS IV BOLUS
1000.0000 mL | Freq: Once | INTRAVENOUS | Status: AC
  Administered 2024-11-05: 1000 mL via INTRAVENOUS

## 2024-11-05 MED ORDER — GUAIFENESIN-DM 100-10 MG/5ML PO SYRP: 15.0000 mL | ORAL_SOLUTION | Freq: Three times a day (TID) | ORAL | Status: DC | PRN

## 2024-11-05 MED ORDER — ACETAMINOPHEN 325 MG PO TABS
650.0000 mg | ORAL_TABLET | Freq: Four times a day (QID) | ORAL | Status: DC | PRN
  Administered 2024-11-05 – 2024-11-07 (×3): 650 mg via ORAL
  Filled 2024-11-05 (×4): qty 2

## 2024-11-05 MED ORDER — SODIUM CHLORIDE 0.9 % IV SOLN: INTRAVENOUS | Status: AC

## 2024-11-05 MED ORDER — ADULT MULTIVITAMIN W/MINERALS CH
1.0000 | ORAL_TABLET | Freq: Every day | ORAL | Status: DC
  Administered 2024-11-06 – 2024-11-11 (×6): 1 via ORAL
  Filled 2024-11-05 (×4): qty 1

## 2024-11-05 MED ORDER — IPRATROPIUM-ALBUTEROL 0.5-2.5 (3) MG/3ML IN SOLN: 3.0000 mL | RESPIRATORY_TRACT | Status: DC | PRN

## 2024-11-05 MED ORDER — VANCOMYCIN HCL 1250 MG/250ML IV SOLN: 1250.0000 mg | Freq: Once | INTRAVENOUS | Status: DC

## 2024-11-05 MED ORDER — THIAMINE MONONITRATE 100 MG PO TABS
100.0000 mg | ORAL_TABLET | Freq: Every day | ORAL | Status: DC
  Administered 2024-11-06 – 2024-11-11 (×6): 100 mg via ORAL
  Filled 2024-11-05 (×4): qty 1

## 2024-11-05 MED ORDER — PIPERACILLIN-TAZOBACTAM 3.375 G IVPB 30 MIN: 3.3750 g | Freq: Once | INTRAVENOUS | Status: DC

## 2024-11-05 NOTE — Telephone Encounter (Signed)
 Patient being admitted to [facility] for lung abscess with possible cavitation. Hospitalist plans to treat with 2 weeks of antibiotics. I recommended Augmentin . Will arrange as new consult with me in 2 weeks for follow-up in pulmonary clinic

## 2024-11-05 NOTE — H&P (Signed)
 " History and Physical   History and Physical    Donald Stewart FMW:[id number] DOB: 02/23/1971 DOA: 11/05/2024  PCP: [name], MD   Patient coming from: Nursing home  I have personally briefly reviewed patient's old medical records in [facility]  Chief Complaint: Right-sided flank pain  HPI: Donald Stewart is a 53 y.o. male with medical history significant for alcohol abuse, COPD, hypertension, tobacco abuse, neuropathy.  Patient presented to the ED with complaints of right flank pain of 1 week duration.  On evaluation, patient is awake alert oriented to person and place, he is able to answer questions but barely responds yes or no to any questions asked.  Per family this is his baseline.  Denies difficulty breathing, denies cough.  No fevers no chills.  No urinary symptoms.  Recently hospitalized 11/29 to 12/20 with delirium tremens, acute encephalopathy secondary to severe life-threatening hypoglycemia.  Also concern for Wernicke's encephalopathy from chronic alcohol abuse.  Was discharged to skilled nursing facility from the hospital.  He has not had any alcohol intake since he was hospitalized 11/29.  ED Course: Temperature 99.8.  Heart rate 110-124.  Respirate rate 17 -33.  Blood pressure 100-  129 systolic.  O2 sats greater 95% on room air. WBC 24.5. Lactic acid 1.5. UA not suggestive of UTI. CTA chest negative for PE, shows focal consolidation right lower lobe, with central rim-enhancing fluid density area and gas bubbles, suspected right lower lobe necrotizing pneumonia with abscess.  Also question cystitis. UA clean. EDP talked to Dr. Kassie - pulmonology - okay to admit here, treat for 2 weeks with antibiotics.  Recommend Augmentin  on discharge.  Will arrange new consult with her in 2 weeks for follow-up in pulmonology clinic. IV Vanco and Zosyn  started. 1 L bolus given. Blood cultures obtained.  Review of Systems: As per HPI all other systems  reviewed and negative.  Past Medical History:  Diagnosis Date   Asthma    Avascular necrosis of hip, right (HCC) 06/08/2021   Hypercholesterolemia    Hypertension     Past Surgical History:  Procedure Laterality Date   TOTAL HIP ARTHROPLASTY Right      reports that he has been smoking cigarettes. He has a 10 pack-year smoking history. He has never used smokeless tobacco. He reports current alcohol use. He reports that he does not use drugs.  Allergies[1]  Family History  Problem Relation Age of Onset   Hypertension Mother     Prior to Admission medications  Medication Sig Start Date End Date Taking? Authorizing Provider  albuterol  (VENTOLIN  HFA) 108 (90 Base) MCG/ACT inhaler Inhale 2 puffs into the lungs every 6 (six) hours as needed for wheezing or shortness of breath. 07/29/23   [name], MD  cefadroxil  (DURICEF) 500 MG capsule Take 1 capsule (500 mg total) by mouth 2 (two) times daily. X 4 days 10/22/24   Evonnie Lenis, MD  folic acid  (FOLVITE ) 1 MG tablet Take 1 tablet (1 mg total) by mouth daily. 07/30/24   Patel, Rutwik K, MD  Multiple Vitamin (MULTIVITAMIN WITH MINERALS) TABS tablet Take 1 tablet by mouth daily. 10/22/24   Evonnie Lenis, MD  thiamine  (VITAMIN B-1) 100 MG tablet Take 1 tablet (100 mg total) by mouth daily. 10/22/24   Evonnie Lenis, MD    Physical Exam: Vitals:   11/05/24 1800 11/05/24 1815 11/05/24 1830 11/05/24 2041  BP: 125/85 122/82 120/85 122/89  Pulse: (!) 108 (!) 110 (!) 111  Resp: (!) 23 (!) 22 (!) 22   Temp:    99.2 F (37.3 C)  TempSrc:    Oral  SpO2: 100% 100% 100% 95%  Weight:      Height:        Constitutional: NAD, calm, comfortable Vitals:   11/05/24 1800 11/05/24 1815 11/05/24 1830 11/05/24 2041  BP: 125/85 122/82 120/85 122/89  Pulse: (!) 108 (!) 110 (!) 111   Resp: (!) 23 (!) 22 (!) 22   Temp:    99.2 F (37.3 C)  TempSrc:    Oral  SpO2: 100% 100% 100% 95%  Weight:      Height:       Eyes: PERRL, lids and conjunctivae  normal ENMT: Mucous membranes are moist.   Neck: normal, supple, no masses, no thyromegaly Respiratory: clear to auscultation bilaterally, no wheezing, no crackles. Normal respiratory effort. No accessory muscle use.  Cardiovascular: Regular rate and rhythm, no murmurs / rubs / gallops. No extremity edema.  Extremities warm. Abdomen: no tenderness, no masses palpated. No hepatosplenomegaly. Bowel sounds positive.  Musculoskeletal: no clubbing / cyanosis. No joint deformity upper and lower extremities.  Skin: no rashes, lesions, ulcers. No induration Neurologic: No facial asymmetry, management to spontaneously, speech fluent Psychiatric: Normal judgment and insight. Alert and oriented x 3. Normal mood.   Labs on Admission: I have personally reviewed following labs and imaging studies  CBC: Recent Labs  Lab 11/05/24 1350  WBC 24.5*  NEUTROABS 19.5*  HGB 10.1*  HCT 30.5*  MCV 99.7  PLT 338   Basic Metabolic Panel: Recent Labs  Lab 11/05/24 1350  NA 136  K 4.0  CL 94*  CO2 24  GLUCOSE 94  BUN 13  CREATININE 0.85  CALCIUM 10.4*   GFR: Estimated Creatinine Clearance: 73.4 mL/min (by C-G formula based on SCr of 0.85 mg/dL). Liver Function Tests: Recent Labs  Lab 11/05/24 1350  AST 42*  ALT 29  ALKPHOS 82  BILITOT 1.0  PROT 8.4*  ALBUMIN 4.2   No results for input(s): LIPASE, AMYLASE in the last 168 hours. Recent Labs  Lab 11/05/24 1458  AMMONIA 30   Coagulation Profile: Recent Labs  Lab 11/05/24 1458  INR 1.1   Urine analysis:    Component Value Date/Time   COLORURINE YELLOW 11/05/2024 1458   APPEARANCEUR CLEAR 11/05/2024 1458   LABSPEC 1.018 11/05/2024 1458   PHURINE 5.0 11/05/2024 1458   GLUCOSEU NEGATIVE 11/05/2024 1458   HGBUR NEGATIVE 11/05/2024 1458   BILIRUBINUR NEGATIVE 11/05/2024 1458   KETONESUR NEGATIVE 11/05/2024 1458   PROTEINUR NEGATIVE 11/05/2024 1458   NITRITE NEGATIVE 11/05/2024 1458   LEUKOCYTESUR NEGATIVE 11/05/2024 1458     Radiological Exams on Admission: CT Angio Chest PE W and/or Wo Contrast Result Date: 11/05/2024 CLINICAL DATA:  Sepsis right flank pain EXAM: CT ANGIOGRAPHY CHEST CT ABDOMEN AND PELVIS WITH CONTRAST TECHNIQUE: Multidetector CT imaging of the chest was performed using the standard protocol during bolus administration of intravenous contrast. Multiplanar CT image reconstructions and MIPs were obtained to evaluate the vascular anatomy. Multidetector CT imaging of the abdomen and pelvis was performed using the standard protocol during bolus administration of intravenous contrast. RADIATION DOSE REDUCTION: This exam was performed according to the departmental dose-optimization program which includes automated exposure control, adjustment of the mA and/or kV according to patient size and/or use of iterative reconstruction technique. CONTRAST:  OMNIPAQUE  IOHEXOL  350 MG/ML SOLN COMPARISON:  Chest x-ray 11/05/2024, ultrasound 09/23/2023 FINDINGS: CTA CHEST FINDINGS Cardiovascular: Satisfactory opacification  of the pulmonary arteries to the segmental level. No evidence of pulmonary embolism. Nonaneurysmal aorta. No dissection. Normal cardiac size. No pericardial effusion Mediastinum/Nodes: Patent trachea. No suspicious thyroid mass. Mild right hilar nodes measuring up to 12 mm. Esophagus within normal limits. Lungs/Pleura: Focal consolidation in the right lower lobe. Central low density with a few gas bubbles measuring 3.8 x 2.6 cm on the lung bases of the abdomen CT. 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 Chronic appearing irregularity of the mid sternum. Review of the MIP images confirms the above findings. CT ABDOMEN and PELVIS FINDINGS Hepatobiliary: Subcentimeter hypodensities in the liver too small to further characterize. Contracted gallbladder without calcified stone. No biliary dilatation Pancreas: Unremarkable. No pancreatic ductal dilatation or surrounding inflammatory changes. Spleen: Normal in  size without focal abnormality. Adrenals/Urinary Tract: Adrenal glands are normal. Kidneys show no hydronephrosis. Diffuse thick-walled appearance of the bladder Stomach/Bowel: Stomach within normal limits. No dilated small bowel. No acute bowel wall thickening. Negative appendix Vascular/Lymphatic: Aortic atherosclerosis. No enlarged abdominal or pelvic lymph nodes. Reproductive: Prostate is unremarkable. Other: No ascites or free air Musculoskeletal: Right hip replacement with artifact. 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 Review of the MIP images confirms the above findings. IMPRESSION: 1. Negative for acute pulmonary embolus or aortic dissection. 2. Focal consolidation in the right lower lobe with central rim enhancing low-density area and gas bubbles. Findings suspected to represent right lower lobe necrotizing pneumonia and pulmonary abscess in the appropriate clinical setting. Imaging follow-up to resolution recommended to exclude underlying mass. 3. Diffuse thick-walled appearance of the bladder, question cystitis. 4. 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 5. Aortic atherosclerosis. Aortic Atherosclerosis (ICD10-I70.0). Electronically Signed   By: Luke Bun M.D.   On: 11/05/2024 16:11   CT ABDOMEN PELVIS W CONTRAST Result Date: 11/05/2024 CLINICAL DATA:  Sepsis right flank pain EXAM: CT ANGIOGRAPHY CHEST CT ABDOMEN AND PELVIS WITH CONTRAST TECHNIQUE: Multidetector CT imaging of the chest was performed using the standard protocol during bolus administration of intravenous contrast. Multiplanar CT image reconstructions and MIPs were obtained to evaluate the vascular anatomy. Multidetector CT imaging of the abdomen and pelvis was performed using the standard protocol during bolus administration of intravenous contrast. RADIATION DOSE REDUCTION: This exam was performed according to the departmental dose-optimization program which includes automated exposure control, adjustment of the mA  and/or kV according to patient size and/or use of iterative reconstruction technique. CONTRAST:  OMNIPAQUE  IOHEXOL  350 MG/ML SOLN COMPARISON:  Chest x-ray 11/05/2024, ultrasound 09/23/2023 FINDINGS: CTA CHEST FINDINGS Cardiovascular: Satisfactory opacification of the pulmonary arteries to the segmental level. No evidence of pulmonary embolism. Nonaneurysmal aorta. No dissection. Normal cardiac size. No pericardial effusion Mediastinum/Nodes: Patent trachea. No suspicious thyroid mass. Mild right hilar nodes measuring up to 12 mm. Esophagus within normal limits. Lungs/Pleura: Focal consolidation in the right lower lobe. Central low density with a few gas bubbles measuring 3.8 x 2.6 cm on the lung bases of the abdomen CT. 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 Chronic appearing irregularity of the mid sternum. Review of the MIP images confirms the above findings. CT ABDOMEN and PELVIS FINDINGS Hepatobiliary: Subcentimeter hypodensities in the liver too small to further characterize. Contracted gallbladder without calcified stone. No biliary dilatation Pancreas: Unremarkable. No pancreatic ductal dilatation or surrounding inflammatory changes. Spleen: Normal in size without focal abnormality. Adrenals/Urinary Tract: Adrenal glands are normal. Kidneys show no hydronephrosis. Diffuse thick-walled appearance of the bladder Stomach/Bowel: Stomach within normal limits. No dilated small bowel. No acute bowel wall  thickening. Negative appendix Vascular/Lymphatic: Aortic atherosclerosis. No enlarged abdominal or pelvic lymph nodes. Reproductive: Prostate is unremarkable. Other: No ascites or free air Musculoskeletal: Right hip replacement with artifact. 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 Review of the MIP images confirms the above findings. IMPRESSION: 1. Negative for acute pulmonary embolus or aortic dissection. 2. Focal consolidation in the right lower lobe with central rim enhancing low-density area  and gas bubbles. Findings suspected to represent right lower lobe necrotizing pneumonia and pulmonary abscess in the appropriate clinical setting. Imaging follow-up to resolution recommended to exclude underlying mass. 3. Diffuse thick-walled appearance of the bladder, question cystitis. 4. 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 5. Aortic atherosclerosis. Aortic Atherosclerosis (ICD10-I70.0). Electronically Signed   By: Luke Bun M.D.   On: 11/05/2024 16:11   CT Soft Tissue Neck W Contrast Result Date: 11/05/2024 EXAM: CT NECK WITH CONTRAST 11/05/2024 03:34:00 PM TECHNIQUE: CT of the neck was performed with the administration of 100 mL of iohexol  (OMNIPAQUE ) 350 MG/ML injection. Multiplanar reformatted images are provided for review. Automated exposure control, iterative reconstruction, and/or weight based adjustment of the mA/kV was utilized to reduce the radiation dose to as low as reasonably achievable. COMPARISON: None available. CLINICAL HISTORY: dysphagia, sepsis FINDINGS: AERODIGESTIVE TRACT: No discrete mass. No edema. SALIVARY GLANDS: The parotid and submandibular glands are unremarkable. THYROID: Unremarkable. LYMPH NODES: No suspicious cervical lymphadenopathy. SOFT TISSUES: No mass or fluid collection. BONES: Mild cervical spondylosis. OTHER: Visualized sinuses and mastoid air cells are well aerated. Chest reported separately. IMPRESSION: 1. No acute abnormality in the neck. Electronically signed by: Dasie Hamburg MD 11/05/2024 03:44 PM EST RP Workstation: [id number]   DG Chest Portable 1 View Result Date: 11/05/2024 CLINICAL DATA:  Dysphagia and cough. EXAM: PORTABLE CHEST 1 VIEW COMPARISON:  October 10, 2024 FINDINGS: The heart size and mediastinal contours are within normal limits. Low lung volumes are noted with mild right infrahilar atelectasis and/or infiltrate. No pleural effusion or pneumothorax is identified. The visualized skeletal structures are unremarkable. IMPRESSION: Low  lung volumes with mild right infrahilar atelectasis and/or infiltrate. Electronically Signed   By: Suzen Dials M.D.   On: 11/05/2024 14:33   EKG: Independently reviewed.  Sinus tachycardia rate 121, QTc 453.  No significant change from prior.  Assessment/Plan Principal Problem:   Necrotizing pneumonia (HCC) Active Problems:   Sepsis (HCC)   Primary hypertension   Chronic obstructive pulmonary disease (HCC)   Alcoholic liver disease   Assessment and Plan:  Necrotizing pneumonia with sepsis-presenting with right flank pain, denies chest symptoms.  Meet sepsis criteria with tachycardia heart rate 115-104, leukocytosis of 24.5.  Normal lactic acid 1.5.  Tmax 99.8.  COVID influenza RSV negative.  CTA chest - Findings suspected to represent right lower lobe necrotizing pneumonia and pulmonary abscess in the appropriate clinical setting. - IV vancomycin  and Zosyn  continue for now - Pulmonologist Dr. Kassie gage with broad-spectrum antibiotics at this time, no need for transfer to [facility], no indication for bronchoscopy, needs to follow-up with pulmonology for likely repeat imaging, needs extended course of antibiotics for 2 weeks, and on discharge strongly recommends patient be discharged on Augmentin .  Patient to follow-up with her in 2 weeks. - Ct suggestive cystitis, but UA is clean, patient has no urinary symptoms. - Mucolytics as needed - 1 L bolus given, continue N/s 100cc/hr x 15hrs  Alcoholic liver disease-recent hospitalization for delirium tremens.  Patient has not had any alcoholic beverage since hospitalization 11/29.  Was discharged to  nursing home.  Concern for Wernicke's encephalopathy during hospitalization.  Potassium 4. - Thiamine , folate, multivitamins - Family requested discharge to a different nursing home, and wants daughter [name]-contacted about discharge plans.  COPD -stable. - Nebs as needed  Hypertension-stable not on medication.   DVT prophylaxis:  Lovenox  Code Status: Full code Family Communication: Patient's daughter, son and patient's mother at bedside.  Patient's daughter Marolyn -lives in [place], she wants to be contacted about discharge plans when patient is ready for discharge.  Contact #[phone]. Disposition Plan: > 2 days.  Likely discharge back to nursing home Consults called: Pulmonology Admission status:  Inpatient, telemetry I certify that at the point of admission it is my clinical judgment that the patient will require inpatient hospital care spanning beyond 2 midnights from the point of admission due to high intensity of service, high risk for further deterioration and high frequency of surveillance required.    Author: Tully FORBES Carwin, MD 11/05/2024 10:18 PM  For on call review [email or web address].     [1] No Known Allergies  "

## 2024-11-05 NOTE — ED Triage Notes (Signed)
 Pt not able to get much information, when asked about trouble swallowing- pt denies it. Pt c/o rt flank pain since last week.

## 2024-11-05 NOTE — ED Provider Notes (Addendum)
 " [place] EMERGENCY DEPARTMENT AT [facility] Provider Note   CSN: [id number] Arrival date & time: 11/05/24  1321     Patient presents with: No chief complaint on file.   Donald Stewart is a 53 y.o. male with history of asthma, COPD, hyperlipidemia, avascular necrosis of right hip, alcohol abuse with recent admission for metabolic encephalopathy and delirium tremens presents from [facility] with complaints of bodyaches with subjective fevers and chills x 1 week.  Patient reports that he has had somewhat of a cough.  Otherwise no chest pain, shortness of breath, abdominal pain, vomiting, diarrhea, dysuria or increased frequency.  Family at bedside reports that there was concern for possible pneumonia at his facility and additionally has been having difficulty swallowing as well.  Patient himself denies any difficulty swallowing or breathing.  States.  Is not consistent.  Patient appears to be alert and oriented but is overall a poor historian.    HPI    Past Medical History:  Diagnosis Date   Asthma    Avascular necrosis of hip, right (HCC) 06/08/2021   Hypercholesterolemia    Hypertension    Past Surgical History:  Procedure Laterality Date   TOTAL HIP ARTHROPLASTY Right      Prior to Admission medications  Medication Sig Start Date End Date Taking? Authorizing Provider  albuterol  (VENTOLIN  HFA) 108 (90 Base) MCG/ACT inhaler Inhale 2 puffs into the lungs every 6 (six) hours as needed for wheezing or shortness of breath. 07/29/23   [name], MD  cefadroxil  (DURICEF) 500 MG capsule Take 1 capsule (500 mg total) by mouth 2 (two) times daily. X 4 days 10/22/24   Evonnie Lenis, MD  folic acid  (FOLVITE ) 1 MG tablet Take 1 tablet (1 mg total) by mouth daily. 07/30/24   Patel, Rutwik K, MD  Multiple Vitamin (MULTIVITAMIN WITH MINERALS) TABS tablet Take 1 tablet by mouth daily. 10/22/24   Evonnie Lenis, MD  thiamine  (VITAMIN B-1) 100 MG tablet Take 1 tablet (100 mg  total) by mouth daily. 10/22/24   Evonnie Lenis, MD    Allergies: Patient has no known allergies.    Review of Systems  Respiratory:  Positive for cough.     Updated Vital Signs BP 118/81   Pulse (!) 111   Temp 99.8 F (37.7 C) (Oral)   Resp (!) 22   Ht 5' 7 (1.702 m)   Wt 52.2 kg   SpO2 100%   BMI 18.02 kg/m   Physical Exam Vitals and nursing note reviewed.  Constitutional:      General: He is not in acute distress.    Appearance: He is well-developed.  HENT:     Head: Normocephalic and atraumatic.     Mouth/Throat:     Comments: Airways patent without any trismus, pooling of secretions, abnormal phonation no facial swelling, there is no tonsillar exudate, uvula is midline. Eyes:     Conjunctiva/sclera: Conjunctivae normal.  Cardiovascular:     Rate and Rhythm: Normal rate and regular rhythm.     Heart sounds: No murmur heard. Pulmonary:     Effort: Pulmonary effort is normal. No respiratory distress.     Breath sounds: Normal breath sounds.  Abdominal:     Palpations: Abdomen is soft.     Tenderness: There is no abdominal tenderness.  Musculoskeletal:        General: No swelling.     Cervical back: Neck supple.  Skin:    General: Skin is warm and  dry.     Capillary Refill: Capillary refill takes less than 2 seconds.  Neurological:     Mental Status: He is alert.  Psychiatric:        Mood and Affect: Mood normal.     (all labs ordered are listed, but only abnormal results are displayed) Labs Reviewed  CBC WITH DIFFERENTIAL/PLATELET - Abnormal; Notable for the following components:      Result Value   WBC 24.5 (*)    RBC 3.06 (*)    Hemoglobin 10.1 (*)    HCT 30.5 (*)    Neutro Abs 19.5 (*)    Monocytes Absolute 2.9 (*)    Abs Immature Granulocytes 0.15 (*)    All other components within normal limits  COMPREHENSIVE METABOLIC PANEL WITH GFR - Abnormal; Notable for the following components:   Chloride 94 (*)    Calcium 10.4 (*)    Total Protein 8.4  (*)    AST 42 (*)    Anion gap 18 (*)    All other components within normal limits  TROPONIN T, HIGH SENSITIVITY - Abnormal; Notable for the following components:   Troponin T High Sensitivity 27 (*)    All other components within normal limits  TROPONIN T, HIGH SENSITIVITY - Abnormal; Notable for the following components:   Troponin T High Sensitivity 26 (*)    All other components within normal limits  RESP PANEL BY RT-PCR (RSV, FLU A&B, COVID)  RVPGX2  CULTURE, BLOOD (ROUTINE X 2)  CULTURE, BLOOD (ROUTINE X 2)  URINALYSIS, ROUTINE W REFLEX MICROSCOPIC  LACTIC ACID, PLASMA  LACTIC ACID, PLASMA  PROTIME-INR  APTT  AMMONIA    EKG: EKG Interpretation Date/Time:  Thursday November 05 2024 13:43:16 EST Ventricular Rate:  121 PR Interval:  137 QRS Duration:  81 QT Interval:  319 QTC Calculation: 453 R Axis:   72  Text Interpretation: Sinus tachycardia Consider right atrial enlargement Artifact in lead(s) V4 V5 V6 Confirmed by Ula Barter [phone]) on 11/05/2024 1:54:10 PM  Radiology: CT Angio Chest PE W and/or Wo Contrast Result Date: 11/05/2024 CLINICAL DATA:  Sepsis right flank pain EXAM: CT ANGIOGRAPHY CHEST CT ABDOMEN AND PELVIS WITH CONTRAST TECHNIQUE: Multidetector CT imaging of the chest was performed using the standard protocol during bolus administration of intravenous contrast. Multiplanar CT image reconstructions and MIPs were obtained to evaluate the vascular anatomy. Multidetector CT imaging of the abdomen and pelvis was performed using the standard protocol during bolus administration of intravenous contrast. RADIATION DOSE REDUCTION: This exam was performed according to the departmental dose-optimization program which includes automated exposure control, adjustment of the mA and/or kV according to patient size and/or use of iterative reconstruction technique. CONTRAST:  OMNIPAQUE  IOHEXOL  350 MG/ML SOLN COMPARISON:  Chest x-ray 11/05/2024, ultrasound 09/23/2023  FINDINGS: CTA CHEST FINDINGS Cardiovascular: Satisfactory opacification of the pulmonary arteries to the segmental level. No evidence of pulmonary embolism. Nonaneurysmal aorta. No dissection. Normal cardiac size. No pericardial effusion Mediastinum/Nodes: Patent trachea. No suspicious thyroid mass. Mild right hilar nodes measuring up to 12 mm. Esophagus within normal limits. Lungs/Pleura: Focal consolidation in the right lower lobe. Central low density with a few gas bubbles measuring 3.8 x 2.6 cm on the lung bases of the abdomen CT. 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 Chronic appearing irregularity of the mid sternum. Review of the MIP images confirms the above findings. CT ABDOMEN and PELVIS FINDINGS Hepatobiliary: Subcentimeter hypodensities in the liver too small to further characterize. Contracted gallbladder without calcified stone. No biliary  dilatation Pancreas: Unremarkable. No pancreatic ductal dilatation or surrounding inflammatory changes. Spleen: Normal in size without focal abnormality. Adrenals/Urinary Tract: Adrenal glands are normal. Kidneys show no hydronephrosis. Diffuse thick-walled appearance of the bladder Stomach/Bowel: Stomach within normal limits. No dilated small bowel. No acute bowel wall thickening. Negative appendix Vascular/Lymphatic: Aortic atherosclerosis. No enlarged abdominal or pelvic lymph nodes. Reproductive: Prostate is unremarkable. Other: No ascites or free air Musculoskeletal: Right hip replacement with artifact. 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 Review of the MIP images confirms the above findings. IMPRESSION: 1. Negative for acute pulmonary embolus or aortic dissection. 2. Focal consolidation in the right lower lobe with central rim enhancing low-density area and gas bubbles. Findings suspected to represent right lower lobe necrotizing pneumonia and pulmonary abscess in the appropriate clinical setting. Imaging follow-up to resolution recommended to  exclude underlying mass. 3. Diffuse thick-walled appearance of the bladder, question cystitis. 4. 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 5. Aortic atherosclerosis. Aortic Atherosclerosis (ICD10-I70.0). Electronically Signed   By: Luke Bun M.D.   On: 11/05/2024 16:11   CT ABDOMEN PELVIS W CONTRAST Result Date: 11/05/2024 CLINICAL DATA:  Sepsis right flank pain EXAM: CT ANGIOGRAPHY CHEST CT ABDOMEN AND PELVIS WITH CONTRAST TECHNIQUE: Multidetector CT imaging of the chest was performed using the standard protocol during bolus administration of intravenous contrast. Multiplanar CT image reconstructions and MIPs were obtained to evaluate the vascular anatomy. Multidetector CT imaging of the abdomen and pelvis was performed using the standard protocol during bolus administration of intravenous contrast. RADIATION DOSE REDUCTION: This exam was performed according to the departmental dose-optimization program which includes automated exposure control, adjustment of the mA and/or kV according to patient size and/or use of iterative reconstruction technique. CONTRAST:  OMNIPAQUE  IOHEXOL  350 MG/ML SOLN COMPARISON:  Chest x-ray 11/05/2024, ultrasound 09/23/2023 FINDINGS: CTA CHEST FINDINGS Cardiovascular: Satisfactory opacification of the pulmonary arteries to the segmental level. No evidence of pulmonary embolism. Nonaneurysmal aorta. No dissection. Normal cardiac size. No pericardial effusion Mediastinum/Nodes: Patent trachea. No suspicious thyroid mass. Mild right hilar nodes measuring up to 12 mm. Esophagus within normal limits. Lungs/Pleura: Focal consolidation in the right lower lobe. Central low density with a few gas bubbles measuring 3.8 x 2.6 cm on the lung bases of the abdomen CT. 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 Chronic appearing irregularity of the mid sternum. Review of the MIP images confirms the above findings. CT ABDOMEN and PELVIS FINDINGS Hepatobiliary: Subcentimeter  hypodensities in the liver too small to further characterize. Contracted gallbladder without calcified stone. No biliary dilatation Pancreas: Unremarkable. No pancreatic ductal dilatation or surrounding inflammatory changes. Spleen: Normal in size without focal abnormality. Adrenals/Urinary Tract: Adrenal glands are normal. Kidneys show no hydronephrosis. Diffuse thick-walled appearance of the bladder Stomach/Bowel: Stomach within normal limits. No dilated small bowel. No acute bowel wall thickening. Negative appendix Vascular/Lymphatic: Aortic atherosclerosis. No enlarged abdominal or pelvic lymph nodes. Reproductive: Prostate is unremarkable. Other: No ascites or free air Musculoskeletal: Right hip replacement with artifact. 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 Review of the MIP images confirms the above findings. IMPRESSION: 1. Negative for acute pulmonary embolus or aortic dissection. 2. Focal consolidation in the right lower lobe with central rim enhancing low-density area and gas bubbles. Findings suspected to represent right lower lobe necrotizing pneumonia and pulmonary abscess in the appropriate clinical setting. Imaging follow-up to resolution recommended to exclude underlying mass. 3. Diffuse thick-walled appearance of the bladder, question cystitis. 4. AVN of the left femoral head without  collapse. 5. Aortic atherosclerosis. Aortic Atherosclerosis (ICD10-I70.0). Electronically Signed   By: Luke Bun M.D.   On: 11/05/2024 16:11   CT Soft Tissue Neck W Contrast Result Date: 11/05/2024 EXAM: CT NECK WITH CONTRAST 11/05/2024 03:34:00 PM TECHNIQUE: CT of the neck was performed with the administration of 100 mL of iohexol  (OMNIPAQUE ) 350 MG/ML injection. Multiplanar reformatted images are provided for review. Automated exposure control, iterative reconstruction, and/or weight based adjustment of the mA/kV was utilized to reduce the radiation dose to as low as reasonably achievable.  COMPARISON: None available. CLINICAL HISTORY: dysphagia, sepsis FINDINGS: AERODIGESTIVE TRACT: No discrete mass. No edema. SALIVARY GLANDS: The parotid and submandibular glands are unremarkable. THYROID: Unremarkable. LYMPH NODES: No suspicious cervical lymphadenopathy. SOFT TISSUES: No mass or fluid collection. BONES: Mild cervical spondylosis. OTHER: Visualized sinuses and mastoid air cells are well aerated. Chest reported separately. IMPRESSION: 1. No acute abnormality in the neck. Electronically signed by: Dasie Hamburg MD 11/05/2024 03:44 PM EST RP Workstation: [id number]   DG Chest Portable 1 View Result Date: 11/05/2024 CLINICAL DATA:  Dysphagia and cough. EXAM: PORTABLE CHEST 1 VIEW COMPARISON:  October 10, 2024 FINDINGS: The heart size and mediastinal contours are within normal limits. Low lung volumes are noted with mild right infrahilar atelectasis and/or infiltrate. No pleural effusion or pneumothorax is identified. The visualized skeletal structures are unremarkable. IMPRESSION: Low lung volumes with mild right infrahilar atelectasis and/or infiltrate. Electronically Signed   By: Suzen Dials M.D.   On: 11/05/2024 14:33     Procedures   Medications Ordered in the ED  lactated ringers  bolus 1,000 mL (1,000 mLs Intravenous New Bag/Given 11/05/24 1502)  iohexol  (OMNIPAQUE ) 350 MG/ML injection 100 mL (100 mLs Intravenous Contrast Given 11/05/24 1517)  cefTRIAXone  (ROCEPHIN ) injection 1 g (1 g Intramuscular Given 11/05/24 1539)  lactated ringers  bolus 1,000 mL (1,000 mLs Intravenous New Bag/Given 11/05/24 1741)    Clinical Course as of 11/05/24 1830  Thu Nov 05, 2024  1429 Patient brought in from facility with complaints of bodyaches and subjective fevers and chills x 1 week.  Facility was concerned for possible pneumonia additionally reporting difficulty swallowing with food getting stuck.  Patient is overall poor historian and does not appear to be engaged in conversation.  Upon  arrival he is tachycardic to 134 with a temp of 99.8.  Otherwise hemodynamically stable.  His lungs are clear, has no gross deficits on exam, airways patent.  Will begin broad workup. [JT]  1429 CBC with Differential(!) Significant leukocytosis of 24.5, hemoglobin stable [JT]  1430 Comprehensive metabolic panel(!) Anion gap of 18, bicarb and glucose within normal limits, mildly elevated AST [JT]  1530 EKG 12-Lead Sinus tachycardia without ischemic changes [JT]  1530 Troponin T, High Sensitivity(!) Mildly elevated troponin, will trend [JT]  1606 Urinalysis, Routine w reflex microscopic -Urine, Catheterized Unremarkable [JT]  1606 Resp panel by RT-PCR (RSV, Flu A&B, Covid) Anterior Nasal Swab Negative [JT]  1606 CT Soft Tissue Neck W Contrast No acute abnormalities [JT]  1622 CT Angio Chest PE W and/or Wo Contrast Concerning for right lobe necrotizing pneumonia.  Upgraded antibiotics to Zosyn  and vanc, after consultation with pharmacy [JT]  [phone] Will pursue hospitalist admission [JT]  1651 Discussed patient with Dr. Pearlean, agreed for admission. [JT]  1811 Discussed patient with Dr. Kassie with pulmonology/critical care.  Do not feel that transfer to [facility] or bronchoscopy was indicated at this time.  Agreed with broad-spectrum antibiotics, recommended prolonged course of 10 to 14 days. [JT]  Clinical Course User Index [JT] Donnajean Lynwood DEL, PA-C                                 Medical Decision Making Amount and/or Complexity of Data Reviewed Labs: ordered. Decision-making details documented in ED Course. Radiology: ordered. Decision-making details documented in ED Course. ECG/medicine tests:  Decision-making details documented in ED Course.  Risk Prescription drug management. Decision regarding hospitalization.   This patient presents to the ED with chief complaint(s) of cough .  The complaint involves an extensive differential diagnosis and also carries with it a high risk of  complications and morbidity.   Pertinent past medical history as listed in HPI  The differential diagnosis includes  UTI, pneumonia, ACS, deep space infection, Ludwig's angina, malignancy Additional history obtained: Additional history obtained from family Records reviewed Care Everywhere/External Records  Disposition:   Patient admitted  Social Determinants of Health:   none  This note was dictated with voice recognition software.  Despite best efforts at proofreading, errors may have occurred which can change the documentation meaning.       Final diagnoses:  Pneumonia of right lower lobe due to infectious organism    ED Discharge Orders     None          Donnajean Lynwood DEL, PA-C 11/05/24 1708    Donnajean Lynwood DEL, PA-C 11/05/24 1830    Ula Prentice SAUNDERS, MD 11/13/24 0017  "

## 2024-11-05 NOTE — Progress Notes (Signed)
 ED Pharmacy Antibiotic Sign Off An antibiotic consult was received from an ED provider for Zosyn  and Vancomycin  per pharmacy dosing for CAP. A chart review was completed to assess appropriateness.   The following one time order(s) were placed:  Zosyn  3.375 g IV x1 Vancomycin  1250 mg IV x1  Further antibiotic and/or antibiotic pharmacy consults should be ordered by the admitting provider if indicated.   Thank you for allowing pharmacy to be a part of this patient's care.   Damien Napoleon, [facility]  Clinical Pharmacist 11/05/2024 4:43 PM

## 2024-11-06 ENCOUNTER — Encounter (HOSPITAL_COMMUNITY): Payer: Self-pay | Admitting: Internal Medicine

## 2024-11-06 DIAGNOSIS — J850 Gangrene and necrosis of lung: Secondary | ICD-10-CM

## 2024-11-06 LAB — BASIC METABOLIC PANEL WITH GFR
Anion gap: 8 (ref 5–15)
BUN: 10 mg/dL (ref 6–20)
CO2: 32 mmol/L (ref 22–32)
Calcium: 9.7 mg/dL (ref 8.9–10.3)
Chloride: 95 mmol/L — ABNORMAL LOW (ref 98–111)
Creatinine, Ser: 0.7 mg/dL (ref 0.61–1.24)
GFR, Estimated: >60 mL/min
Glucose, Bld: 91 mg/dL (ref 70–99)
Potassium: 4 mmol/L (ref 3.5–5.1)
Sodium: 135 mmol/L (ref 135–145)

## 2024-11-06 LAB — CBC
HCT: 26.7 % — ABNORMAL LOW (ref 39.0–52.0)
Hemoglobin: 8.7 g/dL — ABNORMAL LOW (ref 13.0–17.0)
MCH: 33.1 pg (ref 26.0–34.0)
MCHC: 32.6 g/dL (ref 30.0–36.0)
MCV: 101.5 fL — ABNORMAL HIGH (ref 80.0–100.0)
Platelets: 309 K/uL (ref 150–400)
RBC: 2.63 MIL/uL — ABNORMAL LOW (ref 4.22–5.81)
RDW: 14 % (ref 11.5–15.5)
WBC: 19.7 K/uL — ABNORMAL HIGH (ref 4.0–10.5)
nRBC: 0 % (ref 0.0–0.2)

## 2024-11-06 LAB — HIV ANTIBODY (ROUTINE TESTING W REFLEX): HIV Screen 4th Generation wRfx: NONREACTIVE

## 2024-11-06 MED ORDER — VANCOMYCIN HCL IN DEXTROSE 1-5 GM/200ML-% IV SOLN
1000.0000 mg | Freq: Two times a day (BID) | INTRAVENOUS | Status: DC
  Administered 2024-11-06 – 2024-11-10 (×8): 1000 mg via INTRAVENOUS
  Filled 2024-11-06 (×7): qty 200

## 2024-11-06 MED ORDER — ENSURE PLUS HIGH PROTEIN PO LIQD
237.0000 mL | Freq: Two times a day (BID) | ORAL | Status: DC
  Administered 2024-11-06 – 2024-11-10 (×7): 237 mL via ORAL

## 2024-11-06 MED ORDER — PIPERACILLIN-TAZOBACTAM 3.375 G IVPB
3.3750 g | Freq: Three times a day (TID) | INTRAVENOUS | Status: DC
  Administered 2024-11-06 – 2024-11-10 (×12): 3.375 g via INTRAVENOUS
  Filled 2024-11-06 (×11): qty 50

## 2024-11-06 NOTE — TOC Initial Note (Signed)
 Transition of Care [facility]) - Initial/Assessment Note    Patient Details  Name: Donald Stewart MRN: [id number] Date of Birth: Dec 14, 1970  Transition of Care [facility]) CM/SW Contact:    Sharlyne Stabs, RN Phone Number: 11/06/2024, 1:48 PM  Clinical Narrative:           Patient admitted with necrotizing pneumonia. Patient discharged to [facility] on 10/21/24. He will need PT eval, bed offer and Auth to return. [facility] is not in today, due to holiday. IPCM following for PT eval.       Barriers to Discharge: Continued Medical Work up   Patient Goals and CMS Choice Patient states their goals for this hospitalization and ongoing recovery are:: to get better CMS Medicare.gov Compare Post Acute Care list provided to:: Patient Choice offered to / list presented to : Patient [place] ownership interest in [facility].provided to:: Patient       Activities of Daily Living   ADL Screening (condition at time of admission) Independently performs ADLs?: Yes (appropriate for developmental age) Is the patient deaf or have difficulty hearing?: No Does the patient have difficulty seeing, even when wearing glasses/contacts?: No Does the patient have difficulty concentrating, remembering, or making decisions?: Yes  Permission Sought/Granted       Alcohol / Substance Use: Not Applicable Psych Involvement: No (comment)  Admission diagnosis:  PNA (pneumonia) [J18.9] Pneumonia of right lower lobe due to infectious organism [J18.9] Patient Active Problem List   Diagnosis Date Noted   Necrotizing pneumonia (HCC) 11/05/2024   Sepsis (HCC) 11/05/2024   Delirium tremens (HCC) 10/21/2024   Hypoglycemia 10/10/2024   Acute recurrent frontal sinusitis 11/28/2023   Acute metabolic encephalopathy 11/05/2023   AKI (acute kidney injury) 09/11/2023   Hospital discharge follow-up 09/11/2023   Alcoholic liver disease 09/11/2023   Macrocytic anemia 09/11/2023   Encounter for examination  following treatment at hospital 09/11/2023   Peripheral polyneuropathy 09/11/2023   Fall 08/02/2023   Chronic obstructive pulmonary disease (HCC) 03/27/2023   Other specified nutritional anemias 03/27/2023   Acute bronchitis with COPD (HCC) 07/10/2022   Hypokalemia 07/05/2022   Protein-calorie malnutrition 07/04/2022   Alcohol dependence with unspecified alcohol-induced disorder (HCC) 07/04/2022   Allergic sinusitis 07/04/2022   Encounter for general adult medical examination with abnormal findings 11/09/2021   S/P total right hip arthroplasty 11/09/2021   Prostate cancer screening 11/09/2021   Primary hypertension 06/08/2021   Mixed hyperlipidemia 06/08/2021   Tobacco abuse 06/03/2008   PCP:  [name], MD Pharmacy:   [facility] [phone] - [place], [place] - [address] AT [facility] [address] [place] [address] Phone: [phone] Fax: [phone]     Social Drivers of Health (SDOH) Social History: SDOH Screenings   Food Insecurity: No Food Insecurity (11/05/2024)  Housing: Unknown (11/05/2024)  Transportation Needs: Patient Unable To Answer (11/05/2024)  Utilities: Not At Risk (11/05/2024)  Depression (PHQ2-9): Low Risk (07/30/2024)  Tobacco Use: High Risk (11/06/2024)   SDOH Interventions:    Readmission Risk Interventions    10/21/2024    1:22 PM  Readmission Risk Prevention Plan  Post Dischage Appt Complete  Medication Screening Complete  Transportation Screening Complete

## 2024-11-06 NOTE — Plan of Care (Signed)

## 2024-11-06 NOTE — Progress Notes (Signed)
 Pharmacy Antibiotic Note  Donald Stewart is a 53 y.o. male admitted on 11/05/2024 with pneumonia and sepsis.  Pharmacy has been consulted for vancomycin  and zosyn  dosing.  Patient currently febrile to 102.2, wbc elevated at 19.7. Scr normal at 0.7.  Vancomycin  1000 mg IV Q 12 hrs. Goal AUC 400-550. Expected AUC: 545 SCr used: 0.7 Zosyn  3.375g IV q8 hours  Height: 5' 7 (170.2 cm) Weight: 52.2 kg (115 lb 1.3 oz) IBW/kg (Calculated) : 66.1  Temp (24hrs), Avg:100 F (37.8 C), Min:98.1 F (36.7 C), Max:102.2 F (39 C)  Recent Labs  Lab 11/05/24 1350 11/05/24 1458 11/05/24 1651 11/06/24 0449  WBC 24.5*  --   --  19.7*  CREATININE 0.85  --   --  0.70  LATICACIDVEN  --  1.5 1.1  --     Estimated Creatinine Clearance: 77.9 mL/min (by C-G formula based on SCr of 0.7 mg/dL).    Allergies[1]  Thank you for allowing pharmacy to be a part of this patients care.  Dempsey Blush PharmD., BCPS Clinical Pharmacist 11/06/2024 12:44 PM     [1] No Known Allergies

## 2024-11-06 NOTE — Progress Notes (Signed)
 " PROGRESS NOTE  Donald Stewart, is a 53 y.o. male, DOB - [birth year], FMW:[id number]  Admit date - 11/05/2024   Admitting Physician Ejiroghene FORBES Carwin, MD  Outpatient Primary MD for the patient is Donald Suzzane POUR, MD  LOS - 1  Brief Narrative:  53 y.o. male with medical history significant for alcohol abuse with Warnicke encephalopathy, and polyneuropathy,COPD, HTN, tobacco abuse admitted on 11/05/2024 with sepsis secondary to necrotizing pneumonia    -Assessment and Plan: 1) sepsis secondary to Necrotizing Pneumonia -POA -  CTA chest on admission- Findings suspected to represent right lower lobe necrotizing pneumonia and pulmonary abscess in the appropriate clinical setting.  Pulmonologist Dr. Kassie recommends Vanco and Zosyn  at this time, also stated that no need for transfer to [facility], no indication for bronchoscopy at this time, needs to follow-up with pulmonology for likely repeat imaging, needs extended course of antibiotics for 2 weeks, and on discharge strongly recommends patient be discharged on Augmentin  to complete 2 weeks of ABX treatment.  Patient to follow-up with her in 2 weeks. - Ct suggestive cystitis, but UA is clean, patient has no urinary symptoms. - Continue bronchodilators mucolytics and antitussives -No hypoxia at this time   Alcoholic liver disease-recent hospitalization for delirium tremens.  Patient has not had any alcoholic beverage since hospitalization 11/29.  Patient has been residing at [facility] since patient discharge from the hospital - concern for Wernicke's encephalopathy during recent hospitalization.  Potassium 4. -Continue multivitamin, folic acid  and thiamine   COPD -stable. - Nebs as needed - Hold off on steroids at this time   Dispo-admitted from [facility] facility, - Family requested discharge to a different nursing home, and wants daughter Donald Stewart-contacted about discharge plans.  - Prior to going to recent admission and  subsequent discharge to SNF patient lived with dad and mom at home  Status is: Inpatient   Disposition: The patient is from: SNF              Anticipated d/c is to: SNF              Anticipated d/c date is: 2 days              Patient currently is not medically stable to d/c. Barriers: Not Clinically Stable-   Code Status :  -  Code Status: Full Code   Family Communication:    daughter Donald Stewart-and patient and mom are primary contacts  DVT Prophylaxis  :   - SCDs   enoxaparin  (LOVENOX ) injection 40 mg Start: 11/06/24 1000   Lab Results  Component Value Date   PLT 309 11/06/2024    Inpatient Medications  Scheduled Meds:  enoxaparin  (LOVENOX ) injection  40 mg Subcutaneous Q24H   feeding supplement  237 mL Oral BID BM   folic acid   1 mg Oral Daily   multivitamin with minerals  1 tablet Oral Daily   thiamine   100 mg Oral Daily   Or   thiamine   100 mg Intravenous Daily   Continuous Infusions:  piperacillin -tazobactam (ZOSYN )  IV 3.375 g (11/06/24 1337)   vancomycin  1,000 mg (11/06/24 1335)   PRN Meds:.acetaminophen  **OR** acetaminophen , guaiFENesin -dextromethorphan , ipratropium-albuterol , ondansetron  **OR** ondansetron  (ZOFRAN ) IV, polyethylene glycol   Anti-infectives (From admission, onward)    Start     Dose/Rate Route Frequency Ordered Stop   11/06/24 1400  piperacillin -tazobactam (ZOSYN ) IVPB 3.375 g        3.375 g 12.5 mL/hr over 240 Minutes Intravenous Every 8 hours 11/06/24 1241  11/06/24 1330  vancomycin  (VANCOCIN ) IVPB 1000 mg/200 mL premix        1,000 mg 200 mL/hr over 60 Minutes Intravenous Every 12 hours 11/06/24 1241     11/05/24 1730  vancomycin  (VANCOREADY) IVPB 1250 mg/250 mL  Status:  Discontinued        1,250 mg 166.7 mL/hr over 90 Minutes Intravenous  Once 11/05/24 1641 11/05/24 1644   11/05/24 1645  piperacillin -tazobactam (ZOSYN ) IVPB 3.375 g  Status:  Discontinued        3.375 g 100 mL/hr over 30 Minutes Intravenous  Once 11/05/24 1641  11/05/24 1644   11/05/24 1600  azithromycin  (ZITHROMAX ) 500 mg in sodium chloride  0.9 % 250 mL IVPB  Status:  Discontinued        500 mg 250 mL/hr over 60 Minutes Intravenous Every 24 hours 11/05/24 1545 11/05/24 1631   11/05/24 1515  cefTRIAXone  (ROCEPHIN ) injection 1 g        1 g Intramuscular  Once 11/05/24 1507 11/05/24 1539       Subjective: [name] today has no fevers, no emesis,  No chest pain,   - Cough and dyspnea improving -Mom at bedside, questions answered  Objective: Vitals:   11/06/24 0028 11/06/24 0128 11/06/24 0533 11/06/24 1437  BP: 97/63 94/68 108/70 114/63  Pulse: (!) 117 (!) 109 99 (!) 115  Resp: 20 20 18 18   Temp: (!) 100.6 F (38.1 C) 100 F (37.8 C) 98.1 F (36.7 C) 98.2 F (36.8 C)  TempSrc: Oral Oral Oral Oral  SpO2: 93% 95% 95% 97%  Weight:      Height:        Intake/Output Summary (Last 24 hours) at 11/06/2024 1914 Last data filed at 11/06/2024 1907 Gross per 24 hour  Intake 1050.89 ml  Output 750 ml  Net 300.89 ml   Filed Weights   11/05/24 1332  Weight: 52.2 kg    Physical Exam  Gen:- Awake Alert, no acute distress, appears frail HEENT:- [place].AT, No sclera icterus Neck-Supple Neck,No JVD,.  Lungs-no significant wheezing, fair symmetrical air movement CV- S1, S2 normal, regular  Abd-  +ve B.Sounds, Abd Soft, No tenderness,    Extremity/Skin:- No  edema, pedal pulses present  Psych-affect is flat, oriented x3 Neuro-generalized weakness, no new focal deficits, no tremors  Data Reviewed: I have personally reviewed following labs and imaging studies  CBC: Recent Labs  Lab 11/05/24 1350 11/06/24 0449  WBC 24.5* 19.7*  NEUTROABS 19.5*  --   HGB 10.1* 8.7*  HCT 30.5* 26.7*  MCV 99.7 101.5*  PLT 338 309   Basic Metabolic Panel: Recent Labs  Lab 11/05/24 1350 11/06/24 0449  NA 136 135  K 4.0 4.0  CL 94* 95*  CO2 24 32  GLUCOSE 94 91  BUN 13 10  CREATININE 0.85 0.70  CALCIUM 10.4* 9.7   GFR: Estimated  Creatinine Clearance: 77.9 mL/min (by C-G formula based on SCr of 0.7 mg/dL). Liver Function Tests: Recent Labs  Lab 11/05/24 1350  AST 42*  ALT 29  ALKPHOS 82  BILITOT 1.0  PROT 8.4*  ALBUMIN 4.2   Recent Results (from the past 240 hours)  Resp panel by RT-PCR (RSV, Flu A&B, Covid) Anterior Nasal Swab     Status: None   Collection Time: 11/05/24  2:58 PM   Specimen: Anterior Nasal Swab  Result Value Ref Range Status   SARS Coronavirus 2 by RT PCR NEGATIVE NEGATIVE Final    Comment: (NOTE) SARS-CoV-2 target nucleic acids are  NOT DETECTED.  The SARS-CoV-2 RNA is generally detectable in upper respiratory specimens during the acute phase of infection. The lowest concentration of SARS-CoV-2 viral copies this assay can detect is 138 copies/mL. A negative result does not preclude SARS-Cov-2 infection and should not be used as the sole basis for treatment or other patient management decisions. A negative result may occur with  improper specimen collection/handling, submission of specimen other than nasopharyngeal swab, presence of viral mutation(s) within the areas targeted by this assay, and inadequate number of viral copies(<138 copies/mL). A negative result must be combined with clinical observations, patient history, and epidemiological information. The expected result is Negative.  Fact Sheet for Patients:  bloggercourse.com  Fact Sheet for Healthcare Providers:  seriousbroker.it  This test is no t yet approved or cleared by the [place]  FDA and  has been authorized for detection and/or diagnosis of SARS-CoV-2 by FDA under an Emergency Use Authorization (EUA). This EUA will remain  in effect (meaning this test can be used) for the duration of the COVID-19 declaration under Section 564(b)(1) of the Act, 21 U.S.C.section 360bbb-3(b)(1), unless the authorization is terminated  or revoked sooner.       Influenza A by  PCR NEGATIVE NEGATIVE Final   Influenza B by PCR NEGATIVE NEGATIVE Final    Comment: (NOTE) The Xpert Xpress SARS-CoV-2/FLU/RSV plus assay is intended as an aid in the diagnosis of influenza from Nasopharyngeal swab specimens and should not be used as a sole basis for treatment. Nasal washings and aspirates are unacceptable for Xpert Xpress SARS-CoV-2/FLU/RSV testing.  Fact Sheet for Patients: bloggercourse.com  Fact Sheet for Healthcare Providers: seriousbroker.it  This test is not yet approved or cleared by the [place]  FDA and has been authorized for detection and/or diagnosis of SARS-CoV-2 by FDA under an Emergency Use Authorization (EUA). This EUA will remain in effect (meaning this test can be used) for the duration of the COVID-19 declaration under Section 564(b)(1) of the Act, 21 U.S.C. section 360bbb-3(b)(1), unless the authorization is terminated or revoked.     Resp Syncytial Virus by PCR NEGATIVE NEGATIVE Final    Comment: (NOTE) Fact Sheet for Patients: bloggercourse.com  Fact Sheet for Healthcare Providers: seriousbroker.it  This test is not yet approved or cleared by the [place]  FDA and has been authorized for detection and/or diagnosis of SARS-CoV-2 by FDA under an Emergency Use Authorization (EUA). This EUA will remain in effect (meaning this test can be used) for the duration of the COVID-19 declaration under Section 564(b)(1) of the Act, 21 U.S.C. section 360bbb-3(b)(1), unless the authorization is terminated or revoked.  Performed at [facility], [address], [place], [place] [address]   Blood culture (routine x 2)     Status: None (Preliminary result)   Collection Time: 11/05/24  2:58 PM   Specimen: BLOOD  Result Value Ref Range Status   Specimen Description BLOOD BLOOD RIGHT FOREARM  Final   Special Requests   Final    BOTTLES DRAWN  AEROBIC AND ANAEROBIC Blood Culture adequate volume   Culture   Final    NO GROWTH < 24 HOURS Performed at [facility], [address], [place], [place] [address]    Report Status PENDING  Incomplete  Blood culture (routine x 2)     Status: None (Preliminary result)   Collection Time: 11/05/24  3:34 PM   Specimen: BLOOD  Result Value Ref Range Status   Specimen Description BLOOD LEFT ANTECUBITAL  Final   Special Requests  Final    BOTTLES DRAWN AEROBIC AND ANAEROBIC Blood Culture results may not be optimal due to an inadequate volume of blood received in culture bottles   Culture   Final    NO GROWTH < 24 HOURS Performed at [facility], [address], [place], [place] [address]    Report Status PENDING  Incomplete    Radiology Studies: CT Angio Chest PE W and/or Wo Contrast Result Date: 11/05/2024 CLINICAL DATA:  Sepsis right flank pain EXAM: CT ANGIOGRAPHY CHEST CT ABDOMEN AND PELVIS WITH CONTRAST TECHNIQUE: Multidetector CT imaging of the chest was performed using the standard protocol during bolus administration of intravenous contrast. Multiplanar CT image reconstructions and MIPs were obtained to evaluate the vascular anatomy. Multidetector CT imaging of the abdomen and pelvis was performed using the standard protocol during bolus administration of intravenous contrast. RADIATION DOSE REDUCTION: This exam was performed according to the departmental dose-optimization program which includes automated exposure control, adjustment of the mA and/or kV according to patient size and/or use of iterative reconstruction technique. CONTRAST:  OMNIPAQUE  IOHEXOL  350 MG/ML SOLN COMPARISON:  Chest x-ray 11/05/2024, ultrasound 09/23/2023 FINDINGS: CTA CHEST FINDINGS Cardiovascular: Satisfactory opacification of the pulmonary arteries to the segmental level. No evidence of pulmonary embolism. Nonaneurysmal aorta. No dissection. Normal cardiac size. No pericardial effusion Mediastinum/Nodes: Patent  trachea. No suspicious thyroid mass. Mild right hilar nodes measuring up to 12 mm. Esophagus within normal limits. Lungs/Pleura: Focal consolidation in the right lower lobe. Central low density with a few gas bubbles measuring 3.8 x 2.6 cm on the lung bases of the abdomen CT. 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 Chronic appearing irregularity of the mid sternum. Review of the MIP images confirms the above findings. CT ABDOMEN and PELVIS FINDINGS Hepatobiliary: Subcentimeter hypodensities in the liver too small to further characterize. Contracted gallbladder without calcified stone. No biliary dilatation Pancreas: Unremarkable. No pancreatic ductal dilatation or surrounding inflammatory changes. Spleen: Normal in size without focal abnormality. Adrenals/Urinary Tract: Adrenal glands are normal. Kidneys show no hydronephrosis. Diffuse thick-walled appearance of the bladder Stomach/Bowel: Stomach within normal limits. No dilated small bowel. No acute bowel wall thickening. Negative appendix Vascular/Lymphatic: Aortic atherosclerosis. No enlarged abdominal or pelvic lymph nodes. Reproductive: Prostate is unremarkable. Other: No ascites or free air Musculoskeletal: Right hip replacement with artifact. 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 Review of the MIP images confirms the above findings. IMPRESSION: 1. Negative for acute pulmonary embolus or aortic dissection. 2. Focal consolidation in the right lower lobe with central rim enhancing low-density area and gas bubbles. Findings suspected to represent right lower lobe necrotizing pneumonia and pulmonary abscess in the appropriate clinical setting. Imaging follow-up to resolution recommended to exclude underlying mass. 3. Diffuse thick-walled appearance of the bladder, question cystitis. 4. 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 5. Aortic atherosclerosis. Aortic Atherosclerosis (ICD10-I70.0). Electronically Signed   By: Luke Bun M.D.   On:  11/05/2024 16:11   CT ABDOMEN PELVIS W CONTRAST Result Date: 11/05/2024 CLINICAL DATA:  Sepsis right flank pain EXAM: CT ANGIOGRAPHY CHEST CT ABDOMEN AND PELVIS WITH CONTRAST TECHNIQUE: Multidetector CT imaging of the chest was performed using the standard protocol during bolus administration of intravenous contrast. Multiplanar CT image reconstructions and MIPs were obtained to evaluate the vascular anatomy. Multidetector CT imaging of the abdomen and pelvis was performed using the standard protocol during bolus administration of intravenous contrast. RADIATION DOSE REDUCTION: This exam was performed according to the departmental dose-optimization program which includes automated exposure control,  adjustment of the mA and/or kV according to patient size and/or use of iterative reconstruction technique. CONTRAST:  OMNIPAQUE  IOHEXOL  350 MG/ML SOLN COMPARISON:  Chest x-ray 11/05/2024, ultrasound 09/23/2023 FINDINGS: CTA CHEST FINDINGS Cardiovascular: Satisfactory opacification of the pulmonary arteries to the segmental level. No evidence of pulmonary embolism. Nonaneurysmal aorta. No dissection. Normal cardiac size. No pericardial effusion Mediastinum/Nodes: Patent trachea. No suspicious thyroid mass. Mild right hilar nodes measuring up to 12 mm. Esophagus within normal limits. Lungs/Pleura: Focal consolidation in the right lower lobe. Central low density with a few gas bubbles measuring 3.8 x 2.6 cm on the lung bases of the abdomen CT. 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 Chronic appearing irregularity of the mid sternum. Review of the MIP images confirms the above findings. CT ABDOMEN and PELVIS FINDINGS Hepatobiliary: Subcentimeter hypodensities in the liver too small to further characterize. Contracted gallbladder without calcified stone. No biliary dilatation Pancreas: Unremarkable. No pancreatic ductal dilatation or surrounding inflammatory changes. Spleen: Normal in size without focal  abnormality. Adrenals/Urinary Tract: Adrenal glands are normal. Kidneys show no hydronephrosis. Diffuse thick-walled appearance of the bladder Stomach/Bowel: Stomach within normal limits. No dilated small bowel. No acute bowel wall thickening. Negative appendix Vascular/Lymphatic: Aortic atherosclerosis. No enlarged abdominal or pelvic lymph nodes. Reproductive: Prostate is unremarkable. Other: No ascites or free air Musculoskeletal: Right hip replacement with artifact. 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 Review of the MIP images confirms the above findings. IMPRESSION: 1. Negative for acute pulmonary embolus or aortic dissection. 2. Focal consolidation in the right lower lobe with central rim enhancing low-density area and gas bubbles. Findings suspected to represent right lower lobe necrotizing pneumonia and pulmonary abscess in the appropriate clinical setting. Imaging follow-up to resolution recommended to exclude underlying mass. 3. Diffuse thick-walled appearance of the bladder, question cystitis. 4. 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 5. Aortic atherosclerosis. Aortic Atherosclerosis (ICD10-I70.0). Electronically Signed   By: Luke Bun M.D.   On: 11/05/2024 16:11   CT Soft Tissue Neck W Contrast Result Date: 11/05/2024 EXAM: CT NECK WITH CONTRAST 11/05/2024 03:34:00 PM TECHNIQUE: CT of the neck was performed with the administration of 100 mL of iohexol  (OMNIPAQUE ) 350 MG/ML injection. Multiplanar reformatted images are provided for review. Automated exposure control, iterative reconstruction, and/or weight based adjustment of the mA/kV was utilized to reduce the radiation dose to as low as reasonably achievable. COMPARISON: None available. CLINICAL HISTORY: dysphagia, sepsis FINDINGS: AERODIGESTIVE TRACT: No discrete mass. No edema. SALIVARY GLANDS: The parotid and submandibular glands are unremarkable. THYROID: Unremarkable. LYMPH NODES: No suspicious cervical lymphadenopathy.  SOFT TISSUES: No mass or fluid collection. BONES: Mild cervical spondylosis. OTHER: Visualized sinuses and mastoid air cells are well aerated. Chest reported separately. IMPRESSION: 1. No acute abnormality in the neck. Electronically signed by: Dasie Hamburg MD 11/05/2024 03:44 PM EST RP Workstation: [id number]   DG Chest Portable 1 View Result Date: 11/05/2024 CLINICAL DATA:  Dysphagia and cough. EXAM: PORTABLE CHEST 1 VIEW COMPARISON:  October 10, 2024 FINDINGS: The heart size and mediastinal contours are within normal limits. Low lung volumes are noted with mild right infrahilar atelectasis and/or infiltrate. No pleural effusion or pneumothorax is identified. The visualized skeletal structures are unremarkable. IMPRESSION: Low lung volumes with mild right infrahilar atelectasis and/or infiltrate. Electronically Signed   By: Suzen Dials M.D.   On: 11/05/2024 14:33   Scheduled Meds:  enoxaparin  (LOVENOX ) injection  40 mg Subcutaneous Q24H   feeding supplement  237 mL Oral BID BM   folic  acid  1 mg Oral Daily   multivitamin with minerals  1 tablet Oral Daily   thiamine   100 mg Oral Daily   Or   thiamine   100 mg Intravenous Daily   Continuous Infusions:  piperacillin -tazobactam (ZOSYN )  IV 3.375 g (11/06/24 1337)   vancomycin  1,000 mg (11/06/24 1335)    LOS: 1 day   Rendall Carwin M.D on 11/06/2024 at 7:14 PM  Go to [email or web address] - for contact info  [facility] - Office  [phone]  If 7PM-7AM, please contact night-coverage [email or web address] 11/06/2024, 7:14 PM    "

## 2024-11-06 NOTE — Progress Notes (Signed)
" °   11/05/24 2041  Assess: MEWS Score  Temp 99.2 F (37.3 C)  BP 122/89  MAP (mmHg) 100  Pulse Rate (!) 118  Resp 20  SpO2 95 %  O2 Device Room Air  Assess: MEWS Score  MEWS Temp 0  MEWS Systolic 0  MEWS Pulse 2  MEWS RR 0  MEWS LOC 0  MEWS Score 2  MEWS Score Color Yellow  Assess: if the MEWS score is Yellow or Red  Were vital signs accurate and taken at a resting state? Yes  Does the patient meet 2 or more of the SIRS criteria? Yes  Does the patient have a confirmed or suspected source of infection? Yes  MEWS guidelines implemented  Yes, yellow (treatment started in ED)  Treat  MEWS Interventions Considered administering scheduled or prn medications/treatments as ordered  Take Vital Signs  Increase Vital Sign Frequency  Yellow: Q2hr x1, continue Q4hrs until patient remains green for 12hrs  Escalate  MEWS: Escalate Yellow: Discuss with charge nurse and consider notifying provider and/or RRT  Notify: Charge Nurse/RN  Name of Charge Nurse/RN Notified [facility]  Provider Notification  Provider Name/Title B. Jesus NP  Date Provider Notified 11/05/24  Time Provider Notified 2250  Method of Notification  (amion)  Notification Reason Other (Comment) (Mews Yellow then Red)  Provider response No new orders  Date of Provider Response 11/06/24  Time of Provider Response 2253  Assess: SIRS CRITERIA  SIRS Temperature  0  SIRS Respirations  0  SIRS Pulse 1  SIRS WBC 1  SIRS Score Sum  2    "

## 2024-11-07 DIAGNOSIS — J850 Gangrene and necrosis of lung: Secondary | ICD-10-CM | POA: Diagnosis not present

## 2024-11-07 NOTE — Plan of Care (Signed)

## 2024-11-07 NOTE — Progress Notes (Signed)
" °   11/06/24 2023  Assess: MEWS Score  Temp 100.1 F (37.8 C)  BP 117/75  MAP (mmHg) 86  Pulse Rate (!) 118  Resp 18  SpO2 92 %  O2 Device Room Air  Assess: MEWS Score  MEWS Temp 0  MEWS Systolic 0  MEWS Pulse 2  MEWS RR 0  MEWS LOC 0  MEWS Score 2  MEWS Score Color Yellow  Assess: if the MEWS score is Yellow or Red  Were vital signs accurate and taken at a resting state? Yes  Does the patient meet 2 or more of the SIRS criteria? No  Does the patient have a confirmed or suspected source of infection? Yes  MEWS guidelines implemented  No, previously yellow, continue vital signs every 4 hours  Notify: Charge Nurse/RN  Name of Charge Nurse/RN Notified Sandy  Assess: SIRS CRITERIA  SIRS Temperature  0  SIRS Respirations  0  SIRS Pulse 1  SIRS WBC 0  SIRS Score Sum  1    "

## 2024-11-07 NOTE — Progress Notes (Signed)
 " PROGRESS NOTE  Donald Stewart, is a 53 y.o. male, DOB - 10-Dec-1970, FMW:[id number]  Admit date - 11/05/2024   Admitting Physician Donald FORBES Carwin, MD  Outpatient Primary MD for the patient is Donald Suzzane POUR, MD  LOS - 2  Brief Narrative:  53 y.o. male with medical history significant for alcohol abuse with Warnicke encephalopathy, and polyneuropathy,COPD, HTN, tobacco abuse admitted on 11/05/2024 with sepsis secondary to necrotizing pneumonia    -Assessment and Plan: 1) sepsis secondary to Necrotizing Pneumonia -POA -  CTA chest on admission- Findings suspected to represent right lower lobe necrotizing pneumonia and pulmonary abscess in the appropriate clinical setting.  Pulmonologist Dr. Kassie recommends Vanco and Zosyn  at this time, also stated that no need for transfer to [facility], no indication for bronchoscopy at this time, needs to follow-up with pulmonology for likely repeat imaging, needs extended course of antibiotics for 2 weeks, and on discharge strongly recommends patient be discharged on Augmentin  to complete 2 weeks of ABX treatment.  Patient to follow-up with her in 2 weeks. - Ct suggestive cystitis, but UA is clean, patient has no urinary symptoms. - Continue bronchodilators mucolytics and antitussives -No hypoxia at this time -Blood cultures NGTD   Alcoholic liver disease-recent hospitalization for delirium tremens.  Patient has not had any alcoholic beverage since hospitalization 11/29.  Patient has been residing at [facility] since patient discharge from the hospital - concern for Donald Stewart encephalopathy during recent hospitalization.  Potassium 4. -Continue multivitamin, folic acid  and thiamine   COPD -stable. - Nebs as needed - Hold off on steroids at this time  Generalized Weakness and deconditioning with ambulatory dysfunction--await PT eval for possible transfer back to SNF rehab-   Dispo-admitted from [facility] facility, - Family  requested discharge to a different nursing home, and wants daughter Donald Stewart-contacted about discharge plans.  - Prior to going to recent admission and subsequent discharge to SNF patient lived with dad and mom at home  Status is: Inpatient   Disposition: The patient is from: SNF              Anticipated d/c is to: SNF              Anticipated d/c date is: 2 days              Patient currently is not medically stable to d/c. Barriers: Not Clinically Stable-   Code Status :  -  Code Status: Full Code   Family Communication:    daughter Donald Stewart-and patient and mom are primary contacts  DVT Prophylaxis  :   - SCDs   enoxaparin  (LOVENOX ) injection 40 mg Start: 11/06/24 1000   Lab Results  Component Value Date   PLT 309 11/06/2024    Inpatient Medications  Scheduled Meds:  enoxaparin  (LOVENOX ) injection  40 mg Subcutaneous Q24H   feeding supplement  237 mL Oral BID BM   folic acid   1 mg Oral Daily   multivitamin with minerals  1 tablet Oral Daily   thiamine   100 mg Oral Daily   Or   thiamine   100 mg Intravenous Daily   Continuous Infusions:  piperacillin -tazobactam (ZOSYN )  IV 3.375 g (11/07/24 1426)   vancomycin  1,000 mg (11/07/24 1237)   PRN Meds:.acetaminophen  **OR** acetaminophen , guaiFENesin -dextromethorphan , ipratropium-albuterol , ondansetron  **OR** ondansetron  (ZOFRAN ) IV, polyethylene glycol   Anti-infectives (From admission, onward)    Start     Dose/Rate Route Frequency Ordered Stop   11/06/24 1400  piperacillin -tazobactam (ZOSYN ) IVPB 3.375 g  3.375 g 12.5 mL/hr over 240 Minutes Intravenous Every 8 hours 11/06/24 1241     11/06/24 1330  vancomycin  (VANCOCIN ) IVPB 1000 mg/200 mL premix        1,000 mg 200 mL/hr over 60 Minutes Intravenous Every 12 hours 11/06/24 1241     11/05/24 1730  vancomycin  (VANCOREADY) IVPB 1250 mg/250 mL  Status:  Discontinued        1,250 mg 166.7 mL/hr over 90 Minutes Intravenous  Once 11/05/24 1641 11/05/24 1644   11/05/24  1645  piperacillin -tazobactam (ZOSYN ) IVPB 3.375 g  Status:  Discontinued        3.375 g 100 mL/hr over 30 Minutes Intravenous  Once 11/05/24 1641 11/05/24 1644   11/05/24 1600  azithromycin  (ZITHROMAX ) 500 mg in sodium chloride  0.9 % 250 mL IVPB  Status:  Discontinued        500 mg 250 mL/hr over 60 Minutes Intravenous Every 24 hours 11/05/24 1545 11/05/24 1631   11/05/24 1515  cefTRIAXone  (ROCEPHIN ) injection 1 g        1 g Intramuscular  Once 11/05/24 1507 11/05/24 1539       Subjective: Donald Stewart today has no fevers, no emesis,  No chest pain,   - Cough and dyspnea improving - Oral intake is fair  Objective: Vitals:   11/07/24 0020 11/07/24 0440 11/07/24 0745 11/07/24 1343  BP: 110/72 125/78 112/72 111/80  Pulse: (!) 102 (!) 106 98 (!) 109  Resp: 18 18 18 18   Temp: 98.8 F (37.1 C) 99.5 F (37.5 C) 98.2 F (36.8 C) 98.2 F (36.8 C)  TempSrc: Oral Oral Oral   SpO2: 95% 96% 95% 96%  Weight:      Height:        Intake/Output Summary (Last 24 hours) at 11/07/2024 1737 Last data filed at 11/07/2024 1425 Gross per 24 hour  Intake 1312.32 ml  Output 2100 ml  Net -787.68 ml   Filed Weights   11/05/24 1332  Weight: 52.2 kg    Physical Exam  Gen:- Awake Alert, no acute distress, appears frail HEENT:- Donald Stewart.AT, No sclera icterus Neck-Supple Neck,No JVD,.  Lungs-no significant wheezing, fair symmetrical air movement CV- S1, S2 normal, regular  Abd-  +ve B.Sounds, Abd Soft, No tenderness,    Extremity/Skin:- No  edema, pedal pulses present  Psych-affect is flat, oriented x3 Neuro-generalized weakness, no new focal deficits, no tremors  Data Reviewed: I have personally reviewed following labs and imaging studies  CBC: Recent Labs  Lab 11/05/24 1350 11/06/24 0449  WBC 24.5* 19.7*  NEUTROABS 19.5*  --   HGB 10.1* 8.7*  HCT 30.5* 26.7*  MCV 99.7 101.5*  PLT 338 309   Basic Metabolic Panel: Recent Labs  Lab 11/05/24 1350 11/06/24 0449  NA 136 135   K 4.0 4.0  CL 94* 95*  CO2 24 32  GLUCOSE 94 91  BUN 13 10  CREATININE 0.85 0.70  CALCIUM 10.4* 9.7   GFR: Estimated Creatinine Clearance: 77.9 mL/min (by C-G formula based on SCr of 0.7 mg/dL). Liver Function Tests: Recent Labs  Lab 11/05/24 1350  AST 42*  ALT 29  ALKPHOS 82  BILITOT 1.0  PROT 8.4*  ALBUMIN 4.2   Recent Results (from the past 240 hours)  Resp panel by RT-PCR (RSV, Flu A&B, Covid) Anterior Nasal Swab     Status: None   Collection Time: 11/05/24  2:58 PM   Specimen: Anterior Nasal Swab  Result Value Ref Range Status   SARS Coronavirus 2  by RT PCR NEGATIVE NEGATIVE Final    Comment: (NOTE) SARS-CoV-2 target nucleic acids are NOT DETECTED.  The SARS-CoV-2 RNA is generally detectable in upper respiratory specimens during the acute phase of infection. The lowest concentration of SARS-CoV-2 viral copies this assay can detect is 138 copies/mL. A negative result does not preclude SARS-Cov-2 infection and should not be used as the sole basis for treatment or other patient management decisions. A negative result may occur with  improper specimen collection/handling, submission of specimen other than nasopharyngeal swab, presence of viral mutation(s) within the areas targeted by this assay, and inadequate number of viral copies(<138 copies/mL). A negative result must be combined with clinical observations, patient history, and epidemiological information. The expected result is Negative.  Fact Sheet for Patients:  bloggercourse.com  Fact Sheet for Healthcare Providers:  seriousbroker.it  This test is no t yet approved or cleared by the [place]  FDA and  has been authorized for detection and/or diagnosis of SARS-CoV-2 by FDA under an Emergency Use Authorization (EUA). This EUA will remain  in effect (meaning this test can be used) for the duration of the COVID-19 declaration under Section 564(b)(1) of  the Act, 21 U.S.C.section 360bbb-3(b)(1), unless the authorization is terminated  or revoked sooner.       Influenza A by PCR NEGATIVE NEGATIVE Final   Influenza B by PCR NEGATIVE NEGATIVE Final    Comment: (NOTE) The Xpert Xpress SARS-CoV-2/FLU/RSV plus assay is intended as an aid in the diagnosis of influenza from Nasopharyngeal swab specimens and should not be used as a sole basis for treatment. Nasal washings and aspirates are unacceptable for Xpert Xpress SARS-CoV-2/FLU/RSV testing.  Fact Sheet for Patients: bloggercourse.com  Fact Sheet for Healthcare Providers: seriousbroker.it  This test is not yet approved or cleared by the [place]  FDA and has been authorized for detection and/or diagnosis of SARS-CoV-2 by FDA under an Emergency Use Authorization (EUA). This EUA will remain in effect (meaning this test can be used) for the duration of the COVID-19 declaration under Section 564(b)(1) of the Act, 21 U.S.C. section 360bbb-3(b)(1), unless the authorization is terminated or revoked.     Resp Syncytial Virus by PCR NEGATIVE NEGATIVE Final    Comment: (NOTE) Fact Sheet for Patients: bloggercourse.com  Fact Sheet for Healthcare Providers: seriousbroker.it  This test is not yet approved or cleared by the [place]  FDA and has been authorized for detection and/or diagnosis of SARS-CoV-2 by FDA under an Emergency Use Authorization (EUA). This EUA will remain in effect (meaning this test can be used) for the duration of the COVID-19 declaration under Section 564(b)(1) of the Act, 21 U.S.C. section 360bbb-3(b)(1), unless the authorization is terminated or revoked.  Performed at [facility], [address], [place], [place] [address]   Blood culture (routine x 2)     Status: None (Preliminary result)   Collection Time: 11/05/24  2:58 PM   Specimen: BLOOD  Result  Value Ref Range Status   Specimen Description BLOOD BLOOD RIGHT FOREARM  Final   Special Requests   Final    BOTTLES DRAWN AEROBIC AND ANAEROBIC Blood Culture adequate volume   Culture   Final    NO GROWTH < 24 HOURS Performed at [facility], [address] [place], [place] [address]    Report Status PENDING  Incomplete  Blood culture (routine x 2)     Status: None (Preliminary result)   Collection Time: 11/05/24  3:34 PM   Specimen: BLOOD  Result Value Ref  Range Status   Specimen Description BLOOD LEFT ANTECUBITAL  Final   Special Requests   Final    BOTTLES DRAWN AEROBIC AND ANAEROBIC Blood Culture results may not be optimal due to an inadequate volume of blood received in culture bottles   Culture   Final    NO GROWTH < 24 HOURS Performed at [facility], [address], [place], [place] [address]    Report Status PENDING  Incomplete    Radiology Studies: No results found.  Scheduled Meds:  enoxaparin  (LOVENOX ) injection  40 mg Subcutaneous Q24H   feeding supplement  237 mL Oral BID BM   folic acid   1 mg Oral Daily   multivitamin with minerals  1 tablet Oral Daily   thiamine   100 mg Oral Daily   Or   thiamine   100 mg Intravenous Daily   Continuous Infusions:  piperacillin -tazobactam (ZOSYN )  IV 3.375 g (11/07/24 1426)   vancomycin  1,000 mg (11/07/24 1237)    LOS: 2 days   Rendall Stewart M.D on 11/07/2024 at 5:37 PM  Go to [email or web address] - for contact info  [facility] - Office  [phone]  If 7PM-7AM, please contact night-coverage [email or web address] 11/07/2024, 5:37 PM    "

## 2024-11-08 DIAGNOSIS — J850 Gangrene and necrosis of lung: Secondary | ICD-10-CM | POA: Diagnosis not present

## 2024-11-08 LAB — CBC
HCT: 26.5 % — ABNORMAL LOW (ref 39.0–52.0)
Hemoglobin: 9 g/dL — ABNORMAL LOW (ref 13.0–17.0)
MCH: 32.8 pg (ref 26.0–34.0)
MCHC: 34 g/dL (ref 30.0–36.0)
MCV: 96.7 fL (ref 80.0–100.0)
Platelets: 339 K/uL (ref 150–400)
RBC: 2.74 MIL/uL — ABNORMAL LOW (ref 4.22–5.81)
RDW: 13.8 % (ref 11.5–15.5)
WBC: 17.2 K/uL — ABNORMAL HIGH (ref 4.0–10.5)
nRBC: 0 % (ref 0.0–0.2)

## 2024-11-08 MED ORDER — DM-GUAIFENESIN ER 30-600 MG PO TB12
1.0000 | ORAL_TABLET | Freq: Two times a day (BID) | ORAL | Status: DC
  Administered 2024-11-08 – 2024-11-11 (×6): 1 via ORAL
  Filled 2024-11-08 (×4): qty 1

## 2024-11-08 NOTE — Plan of Care (Signed)
   Problem: Activity: Goal: Risk for activity intolerance will decrease Outcome: Progressing   Problem: Nutrition: Goal: Adequate nutrition will be maintained Outcome: Progressing   Problem: Safety: Goal: Ability to remain free from injury will improve Outcome: Progressing   Problem: Skin Integrity: Goal: Risk for impaired skin integrity will decrease Outcome: Progressing

## 2024-11-08 NOTE — TOC Initial Note (Signed)
 Transition of Care [facility]) - Initial/Assessment Note    Patient Details  Name: Donald Stewart MRN: [id number] Date of Birth: 02/15/1971  [facility] CM/SW Contact:    Lucie Lunger, LCSWA Phone Number: 11/08/2024, 1:50 PM  Clinical Narrative:                 Pt arrived to hospital from SNF at [facility]. CSW spoke with pts mother who states they would prefer a new SNF if possible. CSW explained that PT will need to work with pt. CSW also explained that referral can be sent out for review but there is no guarantee that there will be another facility that can offer and pt may have to return to [facility]. TOC to follow.   Expected Discharge Plan: Skilled Nursing Facility Barriers to Discharge: Continued Medical Work up   Patient Goals and CMS Choice Patient states their goals for this hospitalization and ongoing recovery are:: get stronger CMS Medicare.gov Compare Post Acute Care list provided to:: Patient Choice offered to / list presented to : Patient Friendly ownership interest in [facility].provided to:: Patient    Expected Discharge Plan and Services In-house Referral: Clinical Social Work Discharge Planning Services: CM Consult Post Acute Care Choice: Skilled Nursing Facility                                        Prior Living Arrangements/Services   Lives with:: Self Patient language and need for interpreter reviewed:: Yes Do you feel safe going back to the place where you live?: Yes      Need for Family Participation in Patient Care: Yes (Comment) Care giver support system in place?: Yes (comment)   Criminal Activity/Legal Involvement Pertinent to Current Situation/Hospitalization: No - Comment as needed  Activities of Daily Living   ADL Screening (condition at time of admission) Independently performs ADLs?: Yes (appropriate for developmental age) Is the patient deaf or have difficulty hearing?: No Does the  patient have difficulty seeing, even when wearing glasses/contacts?: No Does the patient have difficulty concentrating, remembering, or making decisions?: Yes  Permission Sought/Granted                  Emotional Assessment Appearance:: Appears stated age Attitude/Demeanor/Rapport: Engaged Affect (typically observed): Accepting   Alcohol / Substance Use: Not Applicable Psych Involvement: No (comment)  Admission diagnosis:  PNA (pneumonia) [J18.9] Pneumonia of right lower lobe due to infectious organism [J18.9] Patient Active Problem List   Diagnosis Date Noted   Necrotizing pneumonia (HCC) 11/05/2024   Sepsis (HCC) 11/05/2024   Delirium tremens (HCC) 10/21/2024   Hypoglycemia 10/10/2024   Acute recurrent frontal sinusitis 11/28/2023   Acute metabolic encephalopathy 11/05/2023   AKI (acute kidney injury) 09/11/2023   Hospital discharge follow-up 09/11/2023   Alcoholic liver disease 09/11/2023   Macrocytic anemia 09/11/2023   Encounter for examination following treatment at hospital 09/11/2023   Peripheral polyneuropathy 09/11/2023   Fall 08/02/2023   Chronic obstructive pulmonary disease (HCC) 03/27/2023   Other specified nutritional anemias 03/27/2023   Acute bronchitis with COPD (HCC) 07/10/2022   Hypokalemia 07/05/2022   Protein-calorie malnutrition 07/04/2022   Alcohol dependence with unspecified alcohol-induced disorder (HCC) 07/04/2022   Allergic sinusitis 07/04/2022   Encounter for general adult medical examination with abnormal findings 11/09/2021   S/P total right hip arthroplasty 11/09/2021   Prostate cancer screening 11/09/2021  Primary hypertension 06/08/2021   Mixed hyperlipidemia 06/08/2021   Tobacco abuse 06/03/2008   PCP:  [name], MD Pharmacy:   [facility] [phone] - [place], [place] - [address] AT [facility] [address] [place] [address] Phone: [phone] Fax: [phone]  [facility]  [place] - [place], [place] - [address] [place] [address] Phone: [phone] Fax: [phone]     Social Drivers of Health (SDOH) Social History: SDOH Screenings   Food Insecurity: No Food Insecurity (11/05/2024)  Housing: Unknown (11/05/2024)  Transportation Needs: Patient Unable To Answer (11/05/2024)  Utilities: Not At Risk (11/05/2024)  Depression (PHQ2-9): Low Risk (07/30/2024)  Tobacco Use: High Risk (11/06/2024)   SDOH Interventions:     Readmission Risk Interventions    10/21/2024    1:22 PM  Readmission Risk Prevention Plan  Post Dischage Appt Complete  Medication Screening Complete  Transportation Screening Complete

## 2024-11-08 NOTE — NC FL2 (Signed)
 " [place]  MEDICAID FL2 LEVEL OF CARE FORM     IDENTIFICATION  Patient Name: Donald Stewart Birthdate: 11-02-1971 Sex: male Admission Date (Current Location): 11/05/2024  [facility] [place] Number:  [employer] and Address:  [facility],  [address], [place] [address]      Provider Number: [id number]  Attending Physician Name and Address:  Pearlean Manus, MD  Relative Name and Phone Number:       Current Level of Care: Hospital Recommended Level of Care: Skilled Nursing Facility Prior Approval Number:    Date Approved/Denied:   PASRR Number: [id number]  Discharge Plan: SNF    Current Diagnoses: Patient Active Problem List   Diagnosis Date Noted   Necrotizing pneumonia (HCC) 11/05/2024   Sepsis (HCC) 11/05/2024   Delirium tremens (HCC) 10/21/2024   Hypoglycemia 10/10/2024   Acute recurrent frontal sinusitis 11/28/2023   Acute metabolic encephalopathy 11/05/2023   AKI (acute kidney injury) 09/11/2023   Hospital discharge follow-up 09/11/2023   Alcoholic liver disease 09/11/2023   Macrocytic anemia 09/11/2023   Encounter for examination following treatment at hospital 09/11/2023   Peripheral polyneuropathy 09/11/2023   Fall 08/02/2023   Chronic obstructive pulmonary disease (HCC) 03/27/2023   Other specified nutritional anemias 03/27/2023   Acute bronchitis with COPD (HCC) 07/10/2022   Hypokalemia 07/05/2022   Protein-calorie malnutrition 07/04/2022   Alcohol dependence with unspecified alcohol-induced disorder (HCC) 07/04/2022   Allergic sinusitis 07/04/2022   Encounter for general adult medical examination with abnormal findings 11/09/2021   S/P total right hip arthroplasty 11/09/2021   Prostate cancer screening 11/09/2021   Primary hypertension 06/08/2021   Mixed hyperlipidemia 06/08/2021   Tobacco abuse 06/03/2008    Orientation RESPIRATION BLADDER Height & Weight     Self, Place  Normal Incontinent, External  catheter Weight: 115 lb 1.3 oz (52.2 kg) Height:  5' 7 (170.2 cm)  BEHAVIORAL SYMPTOMS/MOOD NEUROLOGICAL BOWEL NUTRITION STATUS      Incontinent Diet (regular)  AMBULATORY STATUS COMMUNICATION OF NEEDS Skin   Extensive Assist Verbally Normal                       Personal Care Assistance Level of Assistance  Bathing, Feeding, Dressing Bathing Assistance: Limited assistance Feeding assistance: Independent Dressing Assistance: Limited assistance     Functional Limitations Info  Sight, Hearing, Speech Sight Info: Adequate Hearing Info: Adequate Speech Info: Adequate    SPECIAL CARE FACTORS FREQUENCY  PT (By licensed PT), OT (By licensed OT)     PT Frequency: 5 times weekly OT Frequency: 5 times weekly            Contractures Contractures Info: Not present    Additional Factors Info  Code Status, Allergies Code Status Info: FULL Allergies Info: NKA           Current Medications (11/08/2024):  This is the current hospital active medication list Current Facility-Administered Medications  Medication Dose Route Frequency Provider Last Rate Last Admin   acetaminophen  (TYLENOL ) tablet 650 mg  650 mg Oral Q6H PRN Emokpae, Ejiroghene E, MD   650 mg at 11/07/24 0446   Or   acetaminophen  (TYLENOL ) suppository 650 mg  650 mg Rectal Q6H PRN Emokpae, Ejiroghene E, MD       enoxaparin  (LOVENOX ) injection 40 mg  40 mg Subcutaneous Q24H Emokpae, Ejiroghene E, MD   40 mg at 11/08/24 0935   feeding supplement (ENSURE PLUS HIGH PROTEIN) liquid 237 mL  237 mL Oral BID  BM Pearlean Manus, MD   237 mL at 11/08/24 0940   folic acid  (FOLVITE ) tablet 1 mg  1 mg Oral Daily Emokpae, Ejiroghene E, MD   1 mg at 11/08/24 9065   guaiFENesin -dextromethorphan  (ROBITUSSIN DM) 100-10 MG/5ML syrup 15 mL  15 mL Oral Q8H PRN Emokpae, Ejiroghene E, MD       ipratropium-albuterol  (DUONEB) 0.5-2.5 (3) MG/3ML nebulizer solution 3 mL  3 mL Nebulization Q4H PRN Emokpae, Ejiroghene E, MD        multivitamin with minerals tablet 1 tablet  1 tablet Oral Daily Emokpae, Ejiroghene E, MD   1 tablet at 11/08/24 0934   ondansetron  (ZOFRAN ) tablet 4 mg  4 mg Oral Q6H PRN Emokpae, Ejiroghene E, MD       Or   ondansetron  (ZOFRAN ) injection 4 mg  4 mg Intravenous Q6H PRN Emokpae, Ejiroghene E, MD       piperacillin -tazobactam (ZOSYN ) IVPB 3.375 g  3.375 g Intravenous Q8H Tanda Dempsey SAUNDERS, RPH 12.5 mL/hr at 11/08/24 0604 3.375 g at 11/08/24 0604   polyethylene glycol (MIRALAX  / GLYCOLAX ) packet 17 g  17 g Oral Daily PRN Emokpae, Ejiroghene E, MD       thiamine  (VITAMIN B1) tablet 100 mg  100 mg Oral Daily Emokpae, Ejiroghene E, MD   100 mg at 11/08/24 9065   Or   thiamine  (VITAMIN B1) injection 100 mg  100 mg Intravenous Daily Emokpae, Ejiroghene E, MD       vancomycin  (VANCOCIN ) IVPB 1000 mg/200 mL premix  1,000 mg Intravenous Q12H Tanda Dempsey SAUNDERS, RPH 200 mL/hr at 11/08/24 1206 1,000 mg at 11/08/24 1206     Discharge Medications: Please see discharge summary for a list of discharge medications.  Relevant Imaging Results:  Relevant Lab Results:   Additional Information SSN: [id number] [place]     "

## 2024-11-08 NOTE — Plan of Care (Signed)

## 2024-11-08 NOTE — Progress Notes (Signed)
 " PROGRESS NOTE  Donald Stewart, is a 53 y.o. male, DOB - [birth year], FMW:[id number]  Admit date - 11/05/2024   Admitting Physician Donald FORBES Carwin, MD  Outpatient Primary MD for the patient is Donald Suzzane POUR, MD  LOS - 3  Brief Narrative:  53 y.o. male with medical history significant for alcohol abuse with Warnicke encephalopathy, and polyneuropathy,COPD, HTN, tobacco abuse admitted on 11/05/2024 with sepsis secondary to Necrotizing Pneumonia    -Assessment and Plan: 1) sepsis secondary to Necrotizing Pneumonia -POA -  CTA chest on admission- Findings suspected to represent right lower lobe necrotizing pneumonia and pulmonary abscess in the appropriate clinical setting.  Pulmonologist Dr. Kassie recommends Vanco and Zosyn  at this time, also stated that no need for transfer to [facility], no indication for bronchoscopy at this time, needs to follow-up with pulmonology for likely repeat imaging, needs extended course of antibiotics for 2 weeks, and on discharge strongly recommends patient be discharged on Augmentin  to complete 2 weeks of ABX treatment.  Patient to follow-up with her in 2 weeks. - Ct suggestive cystitis, but UA is clean, patient has no urinary symptoms. - Continue bronchodilators mucolytics and antitussives -No hypoxia at this time -Blood cultures NGTD   Alcoholic liver disease-recent hospitalization for delirium tremens.  Patient has not had any alcoholic beverage since hospitalization 11/29.  Patient has been residing at [facility] since patient discharge from the hospital - concern for Wernicke's encephalopathy during recent hospitalization.  Potassium 4. -Continue multivitamin, folic acid  and thiamine   COPD -stable. - Nebs as needed - Hold off on steroids at this time  Generalized Weakness and deconditioning with ambulatory dysfunction--await PT eval for possible transfer back to SNF rehab-   Dispo-admitted from [facility] facility, - Family  requested discharge to a different nursing home, and wants daughter Donald Stewart-contacted about discharge plans.  - Prior to going to recent admission and subsequent discharge to SNF patient lived with dad and mom at home  Status is: Inpatient   Disposition: The patient is from: SNF              Anticipated d/c is to: SNF              Anticipated d/c date is: 1 day              Patient currently is not medically stable to d/c. Barriers: Not Clinically Stable-   Code Status :  -  Code Status: Full Code   Family Communication:    daughter Donald Stewart-and patient and mom are primary contacts  DVT Prophylaxis  :   - SCDs   enoxaparin  (LOVENOX ) injection 40 mg Start: 11/06/24 1000   Lab Results  Component Value Date   PLT 339 11/08/2024    Inpatient Medications  Scheduled Meds:  enoxaparin  (LOVENOX ) injection  40 mg Subcutaneous Q24H   feeding supplement  237 mL Oral BID BM   folic acid   1 mg Oral Daily   multivitamin with minerals  1 tablet Oral Daily   thiamine   100 mg Oral Daily   Or   thiamine   100 mg Intravenous Daily   Continuous Infusions:  piperacillin -tazobactam (ZOSYN )  IV 3.375 g (11/08/24 1513)   vancomycin  1,000 mg (11/08/24 1206)   PRN Meds:.acetaminophen  **OR** acetaminophen , guaiFENesin -dextromethorphan , ipratropium-albuterol , ondansetron  **OR** ondansetron  (ZOFRAN ) IV, polyethylene glycol   Anti-infectives (From admission, onward)    Start     Dose/Rate Route Frequency Ordered Stop   11/06/24 1400  piperacillin -tazobactam (ZOSYN ) IVPB 3.375 g  3.375 g 12.5 mL/hr over 240 Minutes Intravenous Every 8 hours 11/06/24 1241     11/06/24 1330  vancomycin  (VANCOCIN ) IVPB 1000 mg/200 mL premix        1,000 mg 200 mL/hr over 60 Minutes Intravenous Every 12 hours 11/06/24 1241     11/05/24 1730  vancomycin  (VANCOREADY) IVPB 1250 mg/250 mL  Status:  Discontinued        1,250 mg 166.7 mL/hr over 90 Minutes Intravenous  Once 11/05/24 1641 11/05/24 1644   11/05/24  1645  piperacillin -tazobactam (ZOSYN ) IVPB 3.375 g  Status:  Discontinued        3.375 g 100 mL/hr over 30 Minutes Intravenous  Once 11/05/24 1641 11/05/24 1644   11/05/24 1600  azithromycin  (ZITHROMAX ) 500 mg in sodium chloride  0.9 % 250 mL IVPB  Status:  Discontinued        500 mg 250 mL/hr over 60 Minutes Intravenous Every 24 hours 11/05/24 1545 11/05/24 1631   11/05/24 1515  cefTRIAXone  (ROCEPHIN ) injection 1 g        1 g Intramuscular  Once 11/05/24 1507 11/05/24 1539       Subjective: Donald Stewart today has no fevers, no emesis,  No chest pain,   - Cough and dyspnea improving - Oral intake is fair -  Objective: Vitals:   11/07/24 1343 11/07/24 1948 11/08/24 0542 11/08/24 1323  BP: 111/80 103/67 110/74 106/78  Pulse: (!) 109 (!) 114 100 (!) 105  Resp: 18 19 18 17   Temp: 98.2 F (36.8 C) 99.7 F (37.6 C) 98.4 F (36.9 C) 98.2 F (36.8 C)  TempSrc:  Oral Oral   SpO2: 96% 96% 97% 100%  Weight:      Height:        Intake/Output Summary (Last 24 hours) at 11/08/2024 1649 Last data filed at 11/08/2024 1206 Gross per 24 hour  Intake 540 ml  Output 950 ml  Net -410 ml   Filed Weights   11/05/24 1332  Weight: 52.2 kg    Physical Exam  Gen:- Awake Alert, no acute distress, appears frail HEENT:- [place].AT, No sclera icterus Neck-Supple Neck,No JVD,.  Lungs-no significant wheezing, fair symmetrical air movement CV- S1, S2 normal, regular  Abd-  +ve B.Sounds, Abd Soft, No tenderness,    Extremity/Skin:- No  edema, pedal pulses present  Psych-affect is flat, oriented x3 Neuro-generalized weakness, no new focal deficits, no tremors  Data Reviewed: I have personally reviewed following labs and imaging studies  CBC: Recent Labs  Lab 11/05/24 1350 11/06/24 0449 11/08/24 0450  WBC 24.5* 19.7* 17.2*  NEUTROABS 19.5*  --   --   HGB 10.1* 8.7* 9.0*  HCT 30.5* 26.7* 26.5*  MCV 99.7 101.5* 96.7  PLT 338 309 339   Basic Metabolic Panel: Recent Labs  Lab  11/05/24 1350 11/06/24 0449  NA 136 135  K 4.0 4.0  CL 94* 95*  CO2 24 32  GLUCOSE 94 91  BUN 13 10  CREATININE 0.85 0.70  CALCIUM 10.4* 9.7   GFR: Estimated Creatinine Clearance: 77.9 mL/min (by C-G formula based on SCr of 0.7 mg/dL). Liver Function Tests: Recent Labs  Lab 11/05/24 1350  AST 42*  ALT 29  ALKPHOS 82  BILITOT 1.0  PROT 8.4*  ALBUMIN 4.2   Recent Results (from the past 240 hours)  Resp panel by RT-PCR (RSV, Flu A&B, Covid) Anterior Nasal Swab     Status: None   Collection Time: 11/05/24  2:58 PM   Specimen: Anterior Nasal Swab  Result Value Ref Range Status   SARS Coronavirus 2 by RT PCR NEGATIVE NEGATIVE Final    Comment: (NOTE) SARS-CoV-2 target nucleic acids are NOT DETECTED.  The SARS-CoV-2 RNA is generally detectable in upper respiratory specimens during the acute phase of infection. The lowest concentration of SARS-CoV-2 viral copies this assay can detect is 138 copies/mL. A negative result does not preclude SARS-Cov-2 infection and should not be used as the sole basis for treatment or other patient management decisions. A negative result may occur with  improper specimen collection/handling, submission of specimen other than nasopharyngeal swab, presence of viral mutation(s) within the areas targeted by this assay, and inadequate number of viral copies(<138 copies/mL). A negative result must be combined with clinical observations, patient history, and epidemiological information. The expected result is Negative.  Fact Sheet for Patients:  bloggercourse.com  Fact Sheet for Healthcare Providers:  seriousbroker.it  This test is no t yet approved or cleared by the [place]  FDA and  has been authorized for detection and/or diagnosis of SARS-CoV-2 by FDA under an Emergency Use Authorization (EUA). This EUA will remain  in effect (meaning this test can be used) for the duration of  the COVID-19 declaration under Section 564(b)(1) of the Act, 21 U.S.C.section 360bbb-3(b)(1), unless the authorization is terminated  or revoked sooner.       Influenza A by PCR NEGATIVE NEGATIVE Final   Influenza B by PCR NEGATIVE NEGATIVE Final    Comment: (NOTE) The Xpert Xpress SARS-CoV-2/FLU/RSV plus assay is intended as an aid in the diagnosis of influenza from Nasopharyngeal swab specimens and should not be used as a sole basis for treatment. Nasal washings and aspirates are unacceptable for Xpert Xpress SARS-CoV-2/FLU/RSV testing.  Fact Sheet for Patients: bloggercourse.com  Fact Sheet for Healthcare Providers: seriousbroker.it  This test is not yet approved or cleared by the [place]  FDA and has been authorized for detection and/or diagnosis of SARS-CoV-2 by FDA under an Emergency Use Authorization (EUA). This EUA will remain in effect (meaning this test can be used) for the duration of the COVID-19 declaration under Section 564(b)(1) of the Act, 21 U.S.C. section 360bbb-3(b)(1), unless the authorization is terminated or revoked.     Resp Syncytial Virus by PCR NEGATIVE NEGATIVE Final    Comment: (NOTE) Fact Sheet for Patients: bloggercourse.com  Fact Sheet for Healthcare Providers: seriousbroker.it  This test is not yet approved or cleared by the [place]  FDA and has been authorized for detection and/or diagnosis of SARS-CoV-2 by FDA under an Emergency Use Authorization (EUA). This EUA will remain in effect (meaning this test can be used) for the duration of the COVID-19 declaration under Section 564(b)(1) of the Act, 21 U.S.C. section 360bbb-3(b)(1), unless the authorization is terminated or revoked.  Performed at [facility], [address], [place], [place] [address]   Blood culture (routine x 2)     Status: None (Preliminary result)   Collection  Time: 11/05/24  2:58 PM   Specimen: BLOOD  Result Value Ref Range Status   Specimen Description BLOOD BLOOD RIGHT FOREARM  Final   Special Requests   Final    BOTTLES DRAWN AEROBIC AND ANAEROBIC Blood Culture adequate volume   Culture   Final    NO GROWTH 3 DAYS Performed at [facility], [address], [place], [place] [address]    Report Status PENDING  Incomplete  Blood culture (routine x 2)     Status: None (Preliminary result)   Collection Time: 11/05/24  3:34  PM   Specimen: BLOOD  Result Value Ref Range Status   Specimen Description BLOOD LEFT ANTECUBITAL  Final   Special Requests   Final    BOTTLES DRAWN AEROBIC AND ANAEROBIC Blood Culture results may not be optimal due to an inadequate volume of blood received in culture bottles   Culture   Final    NO GROWTH 3 DAYS Performed at [facility], [address] [place], [place] [address]    Report Status PENDING  Incomplete    Scheduled Meds:  enoxaparin  (LOVENOX ) injection  40 mg Subcutaneous Q24H   feeding supplement  237 mL Oral BID BM   folic acid   1 mg Oral Daily   multivitamin with minerals  1 tablet Oral Daily   thiamine   100 mg Oral Daily   Or   thiamine   100 mg Intravenous Daily   Continuous Infusions:  piperacillin -tazobactam (ZOSYN )  IV 3.375 g (11/08/24 1513)   vancomycin  1,000 mg (11/08/24 1206)    LOS: 3 days   Rendall Stewart M.D on 11/08/2024 at 4:49 PM  Go to [email or web address] - for contact info  [facility] - Office  [phone]  If 7PM-7AM, please contact night-coverage [email or web address] 11/08/2024, 4:49 PM    "

## 2024-11-09 ENCOUNTER — Telehealth: Payer: Self-pay | Admitting: Internal Medicine

## 2024-11-09 DIAGNOSIS — J850 Gangrene and necrosis of lung: Secondary | ICD-10-CM | POA: Diagnosis not present

## 2024-11-09 LAB — RENAL FUNCTION PANEL
Albumin: 3.5 g/dL (ref 3.5–5.0)
Anion gap: 10 (ref 5–15)
BUN: 10 mg/dL (ref 6–20)
CO2: 29 mmol/L (ref 22–32)
Calcium: 9.9 mg/dL (ref 8.9–10.3)
Chloride: 93 mmol/L — ABNORMAL LOW (ref 98–111)
Creatinine, Ser: 0.6 mg/dL — ABNORMAL LOW (ref 0.61–1.24)
GFR, Estimated: >60 mL/min
Glucose, Bld: 104 mg/dL — ABNORMAL HIGH (ref 70–99)
Phosphorus: 4 mg/dL (ref 2.5–4.6)
Potassium: 3.9 mmol/L (ref 3.5–5.1)
Sodium: 132 mmol/L — ABNORMAL LOW (ref 135–145)

## 2024-11-09 LAB — CBC
HCT: 28 % — ABNORMAL LOW (ref 39.0–52.0)
Hemoglobin: 9.1 g/dL — ABNORMAL LOW (ref 13.0–17.0)
MCH: 32 pg (ref 26.0–34.0)
MCHC: 32.5 g/dL (ref 30.0–36.0)
MCV: 98.6 fL (ref 80.0–100.0)
Platelets: 359 K/uL (ref 150–400)
RBC: 2.84 MIL/uL — ABNORMAL LOW (ref 4.22–5.81)
RDW: 13.9 % (ref 11.5–15.5)
WBC: 13.9 K/uL — ABNORMAL HIGH (ref 4.0–10.5)
nRBC: 0 % (ref 0.0–0.2)

## 2024-11-09 LAB — MRSA NEXT GEN BY PCR, NASAL: MRSA by PCR Next Gen: NOT DETECTED

## 2024-11-09 NOTE — Telephone Encounter (Signed)
 Patient scheduled for 1/22 at 10:30 with JE. This will be printed on discharge papers. Nothing further needed.

## 2024-11-09 NOTE — Progress Notes (Signed)
 " PROGRESS NOTE  Donald Stewart, is a 53 y.o. male, DOB - 1971/02/24, FMW:[id number]  Admit date - 11/05/2024   Admitting Physician Tully FORBES Carwin, MD  Outpatient Primary MD for the patient is Donald Suzzane POUR, MD  LOS - 4  Brief Narrative:  53 y.o. male with medical history significant for alcohol abuse with Warnicke encephalopathy, and polyneuropathy,COPD, HTN, tobacco abuse admitted on 11/05/2024 with sepsis secondary to Necrotizing Pneumonia - As of 11/09/2024 patient is medically stable for discharge to SNF facility on oral Augmentin  when SNF facility bed can be obtained and insurance authorization is available   -Assessment and Plan: 1) sepsis secondary to Necrotizing Pneumonia -POA -  CTA chest on admission- Findings suspected to represent right lower lobe necrotizing pneumonia and pulmonary abscess in the appropriate clinical setting.  Pulmonologist Dr. Kassie recommends Vanco and Zosyn  at this time, also stated that no need for transfer to [facility], no indication for bronchoscopy at this time, needs to follow-up with pulmonology for likely repeat imaging, needs extended course of antibiotics for 2 weeks, and on discharge strongly recommends patient be discharged on Augmentin  to complete 2 weeks of ABX treatment.  Patient to follow-up with her in 2 weeks. - Ct suggestive cystitis, but UA is clean, patient has no urinary symptoms. - Continue bronchodilators mucolytics and antitussives -No hypoxia at this time -Blood cultures NGTD - As of 11/09/2024 patient is medically stable for discharge to SNF facility on oral Augmentin  when SNF facility bed can be obtained and insurance authorization is available   Alcoholic liver disease-recent hospitalization for delirium tremens.  Patient has not had any alcoholic beverage since hospitalization 11/29.  Patient has been residing at [facility] since patient discharge from the hospital - concern for Wernicke's encephalopathy during  recent hospitalization.  Potassium 4. -Continue multivitamin, folic acid  and thiamine   COPD -stable. - Nebs as needed - Hold off on steroids at this time  Generalized Weakness and deconditioning with ambulatory dysfunction--PT eval appreciated recommends transfer back to SNF rehab-   Dispo-admitted from [facility] facility, - Family requested discharge to a different nursing home, and wants daughter Donald Stewart-contacted about discharge plans.  - Prior to going to recent admission and subsequent discharge to SNF patient lived with dad and mom at home  Status is: Inpatient   Disposition: The patient is from: SNF              Anticipated d/c is to: SNF              Anticipated d/c date is: 1 day              Patient currently is medically stable to d/c. Barriers: --- As of 11/09/2024 patient is medically stable for discharge to SNF facility on oral Augmentin  when SNF facility bed can be obtained and insurance authorization is available  Code Status :  -  Code Status: Full Code   Family Communication:    daughter Donald Stewart-and patient and mom are primary contacts  DVT Prophylaxis  :   - SCDs   enoxaparin  (LOVENOX ) injection 40 mg Start: 11/06/24 1000  Lab Results  Component Value Date   PLT 359 11/09/2024    Inpatient Medications  Scheduled Meds:  dextromethorphan -guaiFENesin   1 tablet Oral BID   enoxaparin  (LOVENOX ) injection  40 mg Subcutaneous Q24H   feeding supplement  237 mL Oral BID BM   folic acid   1 mg Oral Daily   multivitamin with minerals  1 tablet Oral  Daily   thiamine   100 mg Oral Daily   Or   thiamine   100 mg Intravenous Daily   Continuous Infusions:  piperacillin -tazobactam (ZOSYN )  IV 3.375 g (11/09/24 1626)   vancomycin  1,000 mg (11/09/24 1233)   PRN Meds:.acetaminophen  **OR** acetaminophen , guaiFENesin -dextromethorphan , ipratropium-albuterol , ondansetron  **OR** ondansetron  (ZOFRAN ) IV, polyethylene glycol   Anti-infectives (From admission, onward)     Start     Dose/Rate Route Frequency Ordered Stop   11/06/24 1400  piperacillin -tazobactam (ZOSYN ) IVPB 3.375 g        3.375 g 12.5 mL/hr over 240 Minutes Intravenous Every 8 hours 11/06/24 1241     11/06/24 1330  vancomycin  (VANCOCIN ) IVPB 1000 mg/200 mL premix        1,000 mg 200 mL/hr over 60 Minutes Intravenous Every 12 hours 11/06/24 1241     11/05/24 1730  vancomycin  (VANCOREADY) IVPB 1250 mg/250 mL  Status:  Discontinued        1,250 mg 166.7 mL/hr over 90 Minutes Intravenous  Once 11/05/24 1641 11/05/24 1644   11/05/24 1645  piperacillin -tazobactam (ZOSYN ) IVPB 3.375 g  Status:  Discontinued        3.375 g 100 mL/hr over 30 Minutes Intravenous  Once 11/05/24 1641 11/05/24 1644   11/05/24 1600  azithromycin  (ZITHROMAX ) 500 mg in sodium chloride  0.9 % 250 mL IVPB  Status:  Discontinued        500 mg 250 mL/hr over 60 Minutes Intravenous Every 24 hours 11/05/24 1545 11/05/24 1631   11/05/24 1515  cefTRIAXone  (ROCEPHIN ) injection 1 g        1 g Intramuscular  Once 11/05/24 1507 11/05/24 1539       Subjective: [name] today has no fevers, no emesis,  No chest pain,   - Cough and dyspnea improving - Oral intake is fair -  Objective: Vitals:   11/08/24 1323 11/08/24 2031 11/09/24 0438 11/09/24 1348  BP: 106/78 105/80 108/68 (!) 132/93  Pulse: (!) 105 (!) 102 (!) 108 (!) 103  Resp: 17 18 18 18   Temp: 98.2 F (36.8 C) 99 F (37.2 C) 99.1 F (37.3 C) 98.5 F (36.9 C)  TempSrc:  Oral Oral   SpO2: 100% 97% 98% 97%  Weight:      Height:        Intake/Output Summary (Last 24 hours) at 11/09/2024 1821 Last data filed at 11/09/2024 1742 Gross per 24 hour  Intake 840 ml  Output 1765 ml  Net -925 ml   Filed Weights   11/05/24 1332  Weight: 52.2 kg    Physical Exam  Gen:- Awake Alert, no acute distress, appears frail HEENT:- [place].AT, No sclera icterus Neck-Supple Neck,No JVD,.  Lungs-no significant wheezing, fair symmetrical air movement CV- S1, S2  normal, regular  Abd-  +ve B.Sounds, Abd Soft, No tenderness,    Extremity/Skin:- No  edema, pedal pulses present  Psych-affect is flat, oriented x3 Neuro-generalized weakness, no new focal deficits, no tremors  Data Reviewed: I have personally reviewed following labs and imaging studies  CBC: Recent Labs  Lab 11/05/24 1350 11/06/24 0449 11/08/24 0450 11/09/24 0516  WBC 24.5* 19.7* 17.2* 13.9*  NEUTROABS 19.5*  --   --   --   HGB 10.1* 8.7* 9.0* 9.1*  HCT 30.5* 26.7* 26.5* 28.0*  MCV 99.7 101.5* 96.7 98.6  PLT 338 309 339 359   Basic Metabolic Panel: Recent Labs  Lab 11/05/24 1350 11/06/24 0449 11/09/24 0516  NA 136 135 132*  K 4.0 4.0 3.9  CL  94* 95* 93*  CO2 24 32 29  GLUCOSE 94 91 104*  BUN 13 10 10   CREATININE 0.85 0.70 0.60*  CALCIUM 10.4* 9.7 9.9  PHOS  --   --  4.0   GFR: Estimated Creatinine Clearance: 77.9 mL/min (A) (by C-G formula based on SCr of 0.6 mg/dL (L)). Liver Function Tests: Recent Labs  Lab 11/05/24 1350 11/09/24 0516  AST 42*  --   ALT 29  --   ALKPHOS 82  --   BILITOT 1.0  --   PROT 8.4*  --   ALBUMIN 4.2 3.5   Recent Results (from the past 240 hours)  Resp panel by RT-PCR (RSV, Flu A&B, Covid) Anterior Nasal Swab     Status: None   Collection Time: 11/05/24  2:58 PM   Specimen: Anterior Nasal Swab  Result Value Ref Range Status   SARS Coronavirus 2 by RT PCR NEGATIVE NEGATIVE Final    Comment: (NOTE) SARS-CoV-2 target nucleic acids are NOT DETECTED.  The SARS-CoV-2 RNA is generally detectable in upper respiratory specimens during the acute phase of infection. The lowest concentration of SARS-CoV-2 viral copies this assay can detect is 138 copies/mL. A negative result does not preclude SARS-Cov-2 infection and should not be used as the sole basis for treatment or other patient management decisions. A negative result may occur with  improper specimen collection/handling, submission of specimen other than nasopharyngeal swab,  presence of viral mutation(s) within the areas targeted by this assay, and inadequate number of viral copies(<138 copies/mL). A negative result must be combined with clinical observations, patient history, and epidemiological information. The expected result is Negative.  Fact Sheet for Patients:  bloggercourse.com  Fact Sheet for Healthcare Providers:  seriousbroker.it  This test is no t yet approved or cleared by the [place]  FDA and  has been authorized for detection and/or diagnosis of SARS-CoV-2 by FDA under an Emergency Use Authorization (EUA). This EUA will remain  in effect (meaning this test can be used) for the duration of the COVID-19 declaration under Section 564(b)(1) of the Act, 21 U.S.C.section 360bbb-3(b)(1), unless the authorization is terminated  or revoked sooner.       Influenza A by PCR NEGATIVE NEGATIVE Final   Influenza B by PCR NEGATIVE NEGATIVE Final    Comment: (NOTE) The Xpert Xpress SARS-CoV-2/FLU/RSV plus assay is intended as an aid in the diagnosis of influenza from Nasopharyngeal swab specimens and should not be used as a sole basis for treatment. Nasal washings and aspirates are unacceptable for Xpert Xpress SARS-CoV-2/FLU/RSV testing.  Fact Sheet for Patients: bloggercourse.com  Fact Sheet for Healthcare Providers: seriousbroker.it  This test is not yet approved or cleared by the [place]  FDA and has been authorized for detection and/or diagnosis of SARS-CoV-2 by FDA under an Emergency Use Authorization (EUA). This EUA will remain in effect (meaning this test can be used) for the duration of the COVID-19 declaration under Section 564(b)(1) of the Act, 21 U.S.C. section 360bbb-3(b)(1), unless the authorization is terminated or revoked.     Resp Syncytial Virus by PCR NEGATIVE NEGATIVE Final    Comment: (NOTE) Fact Sheet for  Patients: bloggercourse.com  Fact Sheet for Healthcare Providers: seriousbroker.it  This test is not yet approved or cleared by the [place]  FDA and has been authorized for detection and/or diagnosis of SARS-CoV-2 by FDA under an Emergency Use Authorization (EUA). This EUA will remain in effect (meaning this test can be used) for the duration of the COVID-19 declaration under  Section 564(b)(1) of the Act, 21 U.S.C. section 360bbb-3(b)(1), unless the authorization is terminated or revoked.  Performed at [facility], [address] [place], [place] [address]   Blood culture (routine x 2)     Status: None (Preliminary result)   Collection Time: 11/05/24  2:58 PM   Specimen: BLOOD  Result Value Ref Range Status   Specimen Description BLOOD BLOOD RIGHT FOREARM  Final   Special Requests   Final    BOTTLES DRAWN AEROBIC AND ANAEROBIC Blood Culture adequate volume   Culture   Final    NO GROWTH 4 DAYS Performed at [facility], [address], [place], [place] [address]    Report Status PENDING  Incomplete  Blood culture (routine x 2)     Status: None (Preliminary result)   Collection Time: 11/05/24  3:34 PM   Specimen: BLOOD  Result Value Ref Range Status   Specimen Description BLOOD LEFT ANTECUBITAL  Final   Special Requests   Final    BOTTLES DRAWN AEROBIC AND ANAEROBIC Blood Culture results may not be optimal due to an inadequate volume of blood received in culture bottles   Culture   Final    NO GROWTH 4 DAYS Performed at [facility], [address], [place], [place] [address]    Report Status PENDING  Incomplete  MRSA Next Gen by PCR, Nasal     Status: None   Collection Time: 11/09/24  4:30 PM   Specimen: Nasal Mucosa; Nasal Swab  Result Value Ref Range Status   MRSA by PCR Next Gen NOT DETECTED NOT DETECTED Final    Comment: (NOTE) The GeneXpert MRSA Assay (FDA approved for NASAL specimens only), is one component of a  comprehensive MRSA colonization surveillance program. It is not intended to diagnose MRSA infection nor to guide or monitor treatment for MRSA infections. Test performance is not FDA approved in patients less than 10 years old. Performed at [facility], [address], [place], [place] [address]     Scheduled Meds:  dextromethorphan -guaiFENesin   1 tablet Oral BID   enoxaparin  (LOVENOX ) injection  40 mg Subcutaneous Q24H   feeding supplement  237 mL Oral BID BM   folic acid   1 mg Oral Daily   multivitamin with minerals  1 tablet Oral Daily   thiamine   100 mg Oral Daily   Or   thiamine   100 mg Intravenous Daily   Continuous Infusions:  piperacillin -tazobactam (ZOSYN )  IV 3.375 g (11/09/24 1626)   vancomycin  1,000 mg (11/09/24 1233)    LOS: 4 days   Rendall Carwin M.D on 11/09/2024 at 6:21 PM  Go to [email or web address] - for contact info  [facility] - Office  [phone]  If 7PM-7AM, please contact night-coverage [email or web address] 11/09/2024, 6:21 PM    "

## 2024-11-09 NOTE — Progress Notes (Signed)
 Mobility Specialist Progress Note:    11/09/24 0940  Mobility  Activity Pivoted/transferred to/from [facility]  Level of Assistance Minimal assist, patient does 75% or more  Assistive Device None  Distance Ambulated (ft) 3 ft  Range of Motion/Exercises Active;All extremities  Activity Response Tolerated well  Mobility Referral Yes  Mobility visit 1 Mobility  Mobility Specialist Start Time (ACUTE ONLY) 0940  Mobility Specialist Stop Time (ACUTE ONLY) 1000  Mobility Specialist Time Calculation (min) (ACUTE ONLY) 20 min   Pt received in chair, requesting assistance to [facility]. Required MinA to stand and transfer with no AD. Tolerated well,asx throughout. Assisted with peri care and returned to chair. Call bell in reach, all needs met.  Kanai Berrios Mobility Specialist Please contact via Special Educational Needs Teacher or  Rehab office at [phone]

## 2024-11-09 NOTE — Plan of Care (Signed)
" °  Problem: Acute Rehab PT Goals(only PT should resolve) Goal: Patient Will Transfer Sit To/From Stand Outcome: Progressing Flowsheets (Taken 11/09/2024 1411) Patient will transfer sit to/from stand: with modified independence Goal: Pt Will Ambulate Outcome: Progressing Flowsheets (Taken 11/09/2024 1411) Pt will Ambulate:  > 125 feet  with modified independence  with least restrictive assistive device Goal: Pt/caregiver will Perform Home Exercise Program Outcome: Progressing Flowsheets (Taken 11/09/2024 1411) Pt/caregiver will Perform Home Exercise Program:  For increased strengthening  For improved balance  Independently   Lacinda Fass, PT, DPT  "

## 2024-11-09 NOTE — Progress Notes (Signed)
 Pharmacy Antibiotic Note  Donald Stewart is a 53 y.o. male admitted on 11/05/2024 with pneumonia and sepsis.  Pharmacy has been consulted for vancomycin  and zosyn  dosing.  Patient now afebrile, wbc trending down to 13. Scr normal at 0.6.  Plan to continue current antibiotics for now with plans to discharge on oral Augmentin .  Will plan on checking vancomycin  trough tomorrow if plans for oral antibiotics get postponed.   Height: 5' 7 (170.2 cm) Weight: 52.2 kg (115 lb 1.3 oz) IBW/kg (Calculated) : 66.1  Temp (24hrs), Avg:98.8 F (37.1 C), Min:98.2 F (36.8 C), Max:99.1 F (37.3 C)  Recent Labs  Lab 11/05/24 1350 11/05/24 1458 11/05/24 1651 11/06/24 0449 11/08/24 0450 11/09/24 0516  WBC 24.5*  --   --  19.7* 17.2* 13.9*  CREATININE 0.85  --   --  0.70  --  0.60*  LATICACIDVEN  --  1.5 1.1  --   --   --     Estimated Creatinine Clearance: 77.9 mL/min (A) (by C-G formula based on SCr of 0.6 mg/dL (L)).    Allergies[1]  Thank you for allowing pharmacy to be a part of this patients care.  Dempsey Blush PharmD., BCPS Clinical Pharmacist 11/09/2024 10:45 AM      [1] No Known Allergies

## 2024-11-09 NOTE — TOC Progression Note (Addendum)
 Transition of Care [facility]) - Progression Note    Patient Details  Name: Donald Stewart MRN: [id number] Date of Birth: [birth year]  Transition of Care [facility]) CM/SW Contact  Mcarthur Saddie Kim, [place] Phone Number: 11/09/2024, 11:35 AM  Clinical Narrative:  LCSW followed up with pt's mother regarding request for new bed search. Notified her that only bed offer was [facility] in [place]. She states pt will need to return to [facility]. PT evaluation completed. [place] [place] to start auth.    Expected Discharge Plan: Skilled Nursing Facility Barriers to Discharge: Continued Medical Work up               Expected Discharge Plan and Services In-house Referral: Clinical Social Work Discharge Planning Services: CM Consult Post Acute Care Choice: Skilled Nursing Facility   Expected Discharge Date: 11/09/24                                     Social Drivers of Health (SDOH) Interventions SDOH Screenings   Food Insecurity: No Food Insecurity (11/05/2024)  Housing: Unknown (11/05/2024)  Transportation Needs: Patient Unable To Answer (11/05/2024)  Utilities: Not At Risk (11/05/2024)  Depression (PHQ2-9): Low Risk (07/30/2024)  Tobacco Use: High Risk (11/06/2024)    Readmission Risk Interventions    10/21/2024    1:22 PM  Readmission Risk Prevention Plan  Post Dischage Appt Complete  Medication Screening Complete  Transportation Screening Complete

## 2024-11-09 NOTE — Plan of Care (Signed)
  Problem: Clinical Measurements: Goal: Will remain free from infection Outcome: Not Progressing   Problem: Activity: Goal: Risk for activity intolerance will decrease Outcome: Not Progressing   Problem: Safety: Goal: Ability to remain free from injury will improve Outcome: Not Progressing   Problem: Skin Integrity: Goal: Risk for impaired skin integrity will decrease Outcome: Not Progressing

## 2024-11-09 NOTE — Telephone Encounter (Signed)
 Patient mother Donald Stewart came by the office patient will be discharged from hospital tomorrow and will need physical therapy to help strengthen is upper and lower body, letter was written from his mother and put in Dr Tobie box.

## 2024-11-09 NOTE — Telephone Encounter (Signed)
Can you help with scheduling.

## 2024-11-09 NOTE — Evaluation (Signed)
 Physical Therapy Evaluation Patient Details Name: Donald Stewart MRN: [id number] DOB: 1971-09-23 Today's Date: 11/09/2024  History of Present Illness  Donald Stewart is a 53 y.o. male with medical history significant for alcohol abuse, COPD, hypertension, tobacco abuse, neuropathy.     Patient presented to the ED with complaints of right flank pain of 1 week duration.  On evaluation, patient is awake alert oriented to person and place, he is able to answer questions but barely responds yes or no to any questions asked.  Per family this is his baseline.  Denies difficulty breathing, denies cough.  No fevers no chills.  No urinary symptoms.   Clinical Impression  Pt was agreeable to completing today's PT evaluation. Pt required CGA initially with sit to stand transfers and ambulation with the RW. However, he was able to progress to SBA with increased mobility and confidence. He required SBA with all bed mobility. He was left in the chair with call bell within reach, chair alarm set, and NT in the room. Patient will benefit from continued skilled physical therapy in hospital and recommended venue below to increase strength, balance, endurance for safe ADLs and gait.         If plan is discharge home, recommend the following: Help with stairs or ramp for entrance;Assist for transportation   Can travel by private vehicle   Yes    Equipment Recommendations None recommended by PT  Recommendations for Other Services       Functional Status Assessment Patient has had a recent decline in their functional status and demonstrates the ability to make significant improvements in function in a reasonable and predictable amount of time.     Precautions / Restrictions Precautions Precautions: Fall Recall of Precautions/Restrictions: Intact Restrictions Weight Bearing Restrictions Per Provider Order: No      Mobility  Bed Mobility Overal bed mobility: Needs Assistance Bed Mobility:  Supine to Sit, Sit to Supine     Supine to sit: Supervision Sit to supine: Supervision, Contact guard assist        Transfers Overall transfer level: Needs assistance Equipment used: Rolling walker (2 wheels) Transfers: Sit to/from Stand, Bed to chair/wheelchair/BSC Sit to Stand: Supervision, Contact guard assist   Step pivot transfers: Supervision            Ambulation/Gait Ambulation/Gait assistance: Supervision, Contact guard assist Gait Distance (Feet): 150 Feet Assistive device: Rolling walker (2 wheels) Gait Pattern/deviations: Step-through pattern, Decreased stride length Gait velocity: decreased     General Gait Details: poor foot clearance bilaterally  Stairs            Wheelchair Mobility     Tilt Bed    Modified Rankin (Stroke Patients Only)       Balance Overall balance assessment: Needs assistance Sitting-balance support: No upper extremity supported, Feet supported Sitting balance-Leahy Scale: Good Sitting balance - Comments: seated EOB   Standing balance support: Bilateral upper extremity supported, During functional activity, Reliant on assistive device for balance Standing balance-Leahy Scale: Fair Standing balance comment: with RW                             Pertinent Vitals/Pain Pain Assessment Pain Assessment: No/denies pain    Home Living Family/patient expects to be discharged to:: Private residence Living Arrangements: Parent Available Help at Discharge: Family;Available 24 hours/day Type of Home: House Home Access: Level entry       Home Layout: One level Home  Equipment: Agricultural Consultant (2 wheels) Additional Comments: Pt was at [facility] prior to this hospitalization.    Prior Function Prior Level of Function : Independent/Modified Independent;Working/employed             Mobility Comments: Pt was indpendent with mobility and working prior to previous hospitalization. ADLs Comments: Pt was  independent with all ADL's and IADL's     Extremity/Trunk Assessment   Upper Extremity Assessment Upper Extremity Assessment: Overall WFL for tasks assessed    Lower Extremity Assessment Lower Extremity Assessment: Generalized weakness    Cervical / Trunk Assessment Cervical / Trunk Assessment: Kyphotic  Communication   Communication Communication: No apparent difficulties    Cognition Arousal: Alert Behavior During Therapy: WFL for tasks assessed/performed   PT - Cognitive impairments: No apparent impairments                         Following commands: Intact       Cueing Cueing Techniques: Verbal cues     General Comments      Exercises     Assessment/Plan    PT Assessment Patient needs continued PT services  PT Problem List Decreased strength;Decreased activity tolerance;Decreased mobility       PT Treatment Interventions Gait training;Functional mobility training;Balance training;Therapeutic exercise;Therapeutic activities;Patient/family education    PT Goals (Current goals can be found in the Care Plan section)  Acute Rehab PT Goals Patient Stated Goal: Pt would like to go home. PT Goal Formulation: With patient Time For Goal Achievement: 11/23/24 Potential to Achieve Goals: Good    Frequency Min 3X/week     Co-evaluation               AM-PAC PT 6 Clicks Mobility  Outcome Measure Help needed turning from your back to your side while in a flat bed without using bedrails?: None Help needed moving from lying on your back to sitting on the side of a flat bed without using bedrails?: None Help needed moving to and from a bed to a chair (including a wheelchair)?: None Help needed standing up from a chair using your arms (e.g., wheelchair or bedside chair)?: A Little Help needed to walk in hospital room?: A Little Help needed climbing 3-5 steps with a railing? : A Lot 6 Click Score: 20    End of Session Equipment Utilized During  Treatment: Gait belt Activity Tolerance: Patient tolerated treatment well Patient left: in chair;with call bell/phone within reach;with chair alarm set;with nursing/sitter in room Nurse Communication: Mobility status PT Visit Diagnosis: Muscle weakness (generalized) (M62.81);Other abnormalities of gait and mobility (R26.89)    Time: 8674-8647 PT Time Calculation (min) (ACUTE ONLY): 27 min   Charges:   PT Evaluation $PT Eval Low Complexity: 1 Low PT Treatments $Therapeutic Activity: 23-37 mins PT General Charges $$ ACUTE PT VISIT: 1 Visit         Lacinda Fass, PT, DPT  11/09/2024, 2:11 PM

## 2024-11-10 DIAGNOSIS — J850 Gangrene and necrosis of lung: Secondary | ICD-10-CM | POA: Diagnosis not present

## 2024-11-10 LAB — CULTURE, BLOOD (ROUTINE X 2)
Culture: NO GROWTH
Culture: NO GROWTH
Special Requests: ADEQUATE

## 2024-11-10 MED ORDER — AMOXICILLIN-POT CLAVULANATE 875-125 MG PO TABS
1.0000 | ORAL_TABLET | Freq: Two times a day (BID) | ORAL | Status: DC
  Administered 2024-11-10 – 2024-11-11 (×3): 1 via ORAL
  Filled 2024-11-10 (×3): qty 1

## 2024-11-10 MED ORDER — ACETAMINOPHEN 325 MG PO TABS: 650.0000 mg | ORAL_TABLET | Freq: Four times a day (QID) | ORAL | Status: AC | PRN

## 2024-11-10 MED ORDER — AMOXICILLIN-POT CLAVULANATE 875-125 MG PO TABS: 1.0000 | ORAL_TABLET | Freq: Two times a day (BID) | ORAL | 0 refills | Status: DC

## 2024-11-10 NOTE — Progress Notes (Signed)
 Mobility Specialist Progress Note:    11/10/24 0910  Mobility  Activity Ambulated with assistance  Level of Assistance Contact guard assist, steadying assist  Assistive Device Other (Comment) (1 HHA)  Distance Ambulated (ft) 15 ft  Range of Motion/Exercises Active;All extremities  Activity Response Tolerated well  Mobility Referral Yes  Mobility visit 1 Mobility  Mobility Specialist Start Time (ACUTE ONLY) 0910  Mobility Specialist Stop Time (ACUTE ONLY) 0930  Mobility Specialist Time Calculation (min) (ACUTE ONLY) 20 min   Pt received supine, requesting assistance to bathroom. Required CGA to stand and ambulate with 1 hand-held assist. Tolerated well, asx throughout. Returned supine, alarm on. All needs met.  Areeba Sulser Mobility Specialist Please contact via Special Educational Needs Teacher or  Rehab office at [phone]

## 2024-11-10 NOTE — Progress Notes (Signed)
 " PROGRESS NOTE  Donald Stewart, is a 53 y.o. male, DOB - [birth year], FMW:[id number]  Admit date - 11/05/2024   Admitting Physician Tully FORBES Carwin, MD  Outpatient Primary MD for the patient is Donald Suzzane POUR, MD  LOS - 5  Brief Narrative:  53 y.o. male with medical history significant for alcohol abuse with Warnicke encephalopathy, and polyneuropathy,COPD, HTN, tobacco abuse admitted on 11/05/2024 with sepsis secondary to Necrotizing Pneumonia - Patient remains medically stable for discharge to SNF facility on oral Augmentin  when SNF facility bed can be obtained and insurance authorization is available   -Assessment and Plan: 1) sepsis secondary to Necrotizing Pneumonia -POA -  CTA chest on admission- Findings suspected to represent right lower lobe necrotizing pneumonia and pulmonary abscess in the appropriate clinical setting.  Pulmonologist Dr. Kassie recommends Vanco and Zosyn  at this time, also stated that no need for transfer to [facility], no indication for bronchoscopy at this time, needs to follow-up with pulmonology for likely repeat imaging, needs extended course of antibiotics for 2 weeks, and on discharge strongly recommends patient be discharged on Augmentin  to complete 2 weeks of ABX treatment.  Patient to follow-up with her pulmonologist Dr. Kassie as outpatient in about 2 weeks. - Ct suggestive cystitis, but UA is clean, patient has no urinary symptoms. - Continue bronchodilators mucolytics and antitussives -No hypoxia  -Blood cultures NGTD ---Patient remains medically stable for discharge to SNF facility on oral Augmentin  when SNF facility bed can be obtained and insurance authorization is available   Alcoholic liver disease-recent hospitalization for delirium tremens.  Patient has not had any alcoholic beverage since hospitalization 11/29.  Patient has been residing at [facility] since patient discharge from the hospital - concern for Wernicke's encephalopathy  during recent hospitalization.  Potassium 4. -Continue multivitamin, folic acid  and thiamine   COPD -stable. - Nebs as needed - Hold off on steroids at this time  Generalized Weakness and deconditioning with ambulatory dysfunction--PT eval appreciated recommends transfer back to SNF rehab-   Dispo-admitted from [facility] facility, - Family requested discharge to a different nursing home, and wants daughter Donald Stewart-contacted about discharge plans.  - Prior to going to recent admission and subsequent discharge to SNF patient lived with dad and mom at home  Status is: Inpatient   Disposition: The patient is from: SNF              Anticipated d/c is to: SNF              Anticipated d/c date is: 1 day              Patient currently is medically stable to d/c. Barriers: --- Patient remains medically stable for discharge to SNF facility on oral Augmentin  when SNF facility bed can be obtained and insurance authorization is available  Code Status :  -  Code Status: Full Code   Family Communication:    daughter Donald Stewart-and patient and mom are primary contacts  DVT Prophylaxis  :   - SCDs   enoxaparin  (LOVENOX ) injection 40 mg Start: 11/06/24 1000  Lab Results  Component Value Date   PLT 359 11/09/2024    Inpatient Medications  Scheduled Meds:  amoxicillin -clavulanate  1 tablet Oral Q12H   dextromethorphan -guaiFENesin   1 tablet Oral BID   enoxaparin  (LOVENOX ) injection  40 mg Subcutaneous Q24H   feeding supplement  237 mL Oral BID BM   folic acid   1 mg Oral Daily   multivitamin with minerals  1  tablet Oral Daily   thiamine   100 mg Oral Daily   Or   thiamine   100 mg Intravenous Daily   Continuous Infusions:   PRN Meds:.acetaminophen  **OR** acetaminophen , guaiFENesin -dextromethorphan , ipratropium-albuterol , ondansetron  **OR** ondansetron  (ZOFRAN ) IV, polyethylene glycol   Anti-infectives (From admission, onward)    Start     Dose/Rate Route Frequency Ordered Stop    11/11/24 0000  amoxicillin -clavulanate (AUGMENTIN ) 875-125 MG tablet        1 tablet Oral 2 times daily 11/10/24 1815 11/21/24 2359   11/10/24 1200  amoxicillin -clavulanate (AUGMENTIN ) 875-125 MG per tablet 1 tablet        1 tablet Oral Every 12 hours 11/10/24 1031     11/06/24 1400  piperacillin -tazobactam (ZOSYN ) IVPB 3.375 g  Status:  Discontinued        3.375 g 12.5 mL/hr over 240 Minutes Intravenous Every 8 hours 11/06/24 1241 11/10/24 1030   11/06/24 1330  vancomycin  (VANCOCIN ) IVPB 1000 mg/200 mL premix  Status:  Discontinued        1,000 mg 200 mL/hr over 60 Minutes Intravenous Every 12 hours 11/06/24 1241 11/10/24 1030   11/05/24 1730  vancomycin  (VANCOREADY) IVPB 1250 mg/250 mL  Status:  Discontinued        1,250 mg 166.7 mL/hr over 90 Minutes Intravenous  Once 11/05/24 1641 11/05/24 1644   11/05/24 1645  piperacillin -tazobactam (ZOSYN ) IVPB 3.375 g  Status:  Discontinued        3.375 g 100 mL/hr over 30 Minutes Intravenous  Once 11/05/24 1641 11/05/24 1644   11/05/24 1600  azithromycin  (ZITHROMAX ) 500 mg in sodium chloride  0.9 % 250 mL IVPB  Status:  Discontinued        500 mg 250 mL/hr over 60 Minutes Intravenous Every 24 hours 11/05/24 1545 11/05/24 1631   11/05/24 1515  cefTRIAXone  (ROCEPHIN ) injection 1 g        1 g Intramuscular  Once 11/05/24 1507 11/05/24 1539       Subjective: [name] today has no fevers, no emesis,  No chest pain,   - -No new complaint - No significant dyspnea - No hypoxia, patient is on room air  Patient remains medically stable for discharge to SNF facility on oral Augmentin  when SNF facility bed can be obtained and insurance authorization is available  Objective: Vitals:   11/09/24 1945 11/10/24 0452 11/10/24 0835 11/10/24 1351  BP: 112/84 123/82 103/76 105/80  Pulse: (!) 105 95 (!) 107 93  Resp: 20 16 16 16   Temp: 98.2 F (36.8 C) 98.1 F (36.7 C) 98.4 F (36.9 C) 98.2 F (36.8 C)  TempSrc: Oral Oral Oral Oral  SpO2:  100% 100% 97% 100%  Weight:      Height:        Intake/Output Summary (Last 24 hours) at 11/10/2024 1816 Last data filed at 11/10/2024 1630 Gross per 24 hour  Intake 2234 ml  Output 1450 ml  Net 784 ml   Filed Weights   11/05/24 1332  Weight: 52.2 kg    Physical Exam  Gen:- Awake Alert, no acute distress, appears frail HEENT:- [place].AT, No sclera icterus Neck-Supple Neck,No JVD,.  Lungs-no significant wheezing, fair symmetrical air movement CV- S1, S2 normal, regular  Abd-  +ve B.Sounds, Abd Soft, No tenderness,    Extremity/Skin:- No  edema, pedal pulses present  Psych-affect is flat, oriented x3, episodes of forgetfulness at times Neuro-generalized weakness, no new focal deficits, no tremors  Data Reviewed: I have personally reviewed following labs and imaging studies  CBC: Recent Labs  Lab 11/05/24 1350 11/06/24 0449 11/08/24 0450 11/09/24 0516  WBC 24.5* 19.7* 17.2* 13.9*  NEUTROABS 19.5*  --   --   --   HGB 10.1* 8.7* 9.0* 9.1*  HCT 30.5* 26.7* 26.5* 28.0*  MCV 99.7 101.5* 96.7 98.6  PLT 338 309 339 359   Basic Metabolic Panel: Recent Labs  Lab 11/05/24 1350 11/06/24 0449 11/09/24 0516  NA 136 135 132*  K 4.0 4.0 3.9  CL 94* 95* 93*  CO2 24 32 29  GLUCOSE 94 91 104*  BUN 13 10 10   CREATININE 0.85 0.70 0.60*  CALCIUM 10.4* 9.7 9.9  PHOS  --   --  4.0   GFR: Estimated Creatinine Clearance: 77.9 mL/min (A) (by C-G formula based on SCr of 0.6 mg/dL (L)). Liver Function Tests: Recent Labs  Lab 11/05/24 1350 11/09/24 0516  AST 42*  --   ALT 29  --   ALKPHOS 82  --   BILITOT 1.0  --   PROT 8.4*  --   ALBUMIN 4.2 3.5   Recent Results (from the past 240 hours)  Resp panel by RT-PCR (RSV, Flu A&B, Covid) Anterior Nasal Swab     Status: None   Collection Time: 11/05/24  2:58 PM   Specimen: Anterior Nasal Swab  Result Value Ref Range Status   SARS Coronavirus 2 by RT PCR NEGATIVE NEGATIVE Final    Comment: (NOTE) SARS-CoV-2 target nucleic acids  are NOT DETECTED.  The SARS-CoV-2 RNA is generally detectable in upper respiratory specimens during the acute phase of infection. The lowest concentration of SARS-CoV-2 viral copies this assay can detect is 138 copies/mL. A negative result does not preclude SARS-Cov-2 infection and should not be used as the sole basis for treatment or other patient management decisions. A negative result may occur with  improper specimen collection/handling, submission of specimen other than nasopharyngeal swab, presence of viral mutation(s) within the areas targeted by this assay, and inadequate number of viral copies(<138 copies/mL). A negative result must be combined with clinical observations, patient history, and epidemiological information. The expected result is Negative.  Fact Sheet for Patients:  bloggercourse.com  Fact Sheet for Healthcare Providers:  seriousbroker.it  This test is no t yet approved or cleared by the [place]  FDA and  has been authorized for detection and/or diagnosis of SARS-CoV-2 by FDA under an Emergency Use Authorization (EUA). This EUA will remain  in effect (meaning this test can be used) for the duration of the COVID-19 declaration under Section 564(b)(1) of the Act, 21 U.S.C.section 360bbb-3(b)(1), unless the authorization is terminated  or revoked sooner.       Influenza A by PCR NEGATIVE NEGATIVE Final   Influenza B by PCR NEGATIVE NEGATIVE Final    Comment: (NOTE) The Xpert Xpress SARS-CoV-2/FLU/RSV plus assay is intended as an aid in the diagnosis of influenza from Nasopharyngeal swab specimens and should not be used as a sole basis for treatment. Nasal washings and aspirates are unacceptable for Xpert Xpress SARS-CoV-2/FLU/RSV testing.  Fact Sheet for Patients: bloggercourse.com  Fact Sheet for Healthcare Providers: seriousbroker.it  This test is  not yet approved or cleared by the [place]  FDA and has been authorized for detection and/or diagnosis of SARS-CoV-2 by FDA under an Emergency Use Authorization (EUA). This EUA will remain in effect (meaning this test can be used) for the duration of the COVID-19 declaration under Section 564(b)(1) of the Act, 21 U.S.C. section 360bbb-3(b)(1), unless the authorization is terminated or  revoked.     Resp Syncytial Virus by PCR NEGATIVE NEGATIVE Final    Comment: (NOTE) Fact Sheet for Patients: bloggercourse.com  Fact Sheet for Healthcare Providers: seriousbroker.it  This test is not yet approved or cleared by the [place]  FDA and has been authorized for detection and/or diagnosis of SARS-CoV-2 by FDA under an Emergency Use Authorization (EUA). This EUA will remain in effect (meaning this test can be used) for the duration of the COVID-19 declaration under Section 564(b)(1) of the Act, 21 U.S.C. section 360bbb-3(b)(1), unless the authorization is terminated or revoked.  Performed at [facility], [address], [place], [place] [address]   Blood culture (routine x 2)     Status: None   Collection Time: 11/05/24  2:58 PM   Specimen: BLOOD  Result Value Ref Range Status   Specimen Description BLOOD BLOOD RIGHT FOREARM  Final   Special Requests   Final    BOTTLES DRAWN AEROBIC AND ANAEROBIC Blood Culture adequate volume   Culture   Final    NO GROWTH 5 DAYS Performed at [facility], [address] [place], [place] [address]    Report Status 11/10/2024 FINAL  Final  Blood culture (routine x 2)     Status: None   Collection Time: 11/05/24  3:34 PM   Specimen: BLOOD  Result Value Ref Range Status   Specimen Description BLOOD LEFT ANTECUBITAL  Final   Special Requests   Final    BOTTLES DRAWN AEROBIC AND ANAEROBIC Blood Culture results may not be optimal due to an inadequate volume of blood received in culture bottles    Culture   Final    NO GROWTH 5 DAYS Performed at [facility], [address], [place], [place] [address]    Report Status 11/10/2024 FINAL  Final  MRSA Next Gen by PCR, Nasal     Status: None   Collection Time: 11/09/24  4:30 PM   Specimen: Nasal Mucosa; Nasal Swab  Result Value Ref Range Status   MRSA by PCR Next Gen NOT DETECTED NOT DETECTED Final    Comment: (NOTE) The GeneXpert MRSA Assay (FDA approved for NASAL specimens only), is one component of a comprehensive MRSA colonization surveillance program. It is not intended to diagnose MRSA infection nor to guide or monitor treatment for MRSA infections. Test performance is not FDA approved in patients less than 11 years old. Performed at [facility] [address] [place], [place] [address]     Scheduled Meds:  amoxicillin -clavulanate  1 tablet Oral Q12H   dextromethorphan -guaiFENesin   1 tablet Oral BID   enoxaparin  (LOVENOX ) injection  40 mg Subcutaneous Q24H   feeding supplement  237 mL Oral BID BM   folic acid   1 mg Oral Daily   multivitamin with minerals  1 tablet Oral Daily   thiamine   100 mg Oral Daily   Or   thiamine   100 mg Intravenous Daily   Continuous Infusions:    LOS: 5 days   Rendall Carwin M.D on 11/10/2024 at 6:16 PM  Go to [email or web address] - for contact info  [facility] - Office  [phone]  If 7PM-7AM, please contact night-coverage [email or web address] 11/10/2024, 6:16 PM    "

## 2024-11-10 NOTE — Plan of Care (Signed)
" °  Problem: Education: Goal: Knowledge of General Education information will improve Description: Including pain rating scale, medication(s)/side effects and non-pharmacologic comfort measures Outcome: Progressing   Problem: Clinical Measurements: Goal: Diagnostic test results will improve Outcome: Progressing   Problem: Activity: Goal: Risk for activity intolerance will decrease Outcome: Progressing   Problem: Safety: Goal: Ability to remain free from injury will improve Outcome: Progressing   Problem: Skin Integrity: Goal: Risk for impaired skin integrity will decrease Outcome: Progressing   Problem: Activity: Goal: Ability to tolerate increased activity will improve Outcome: Progressing   Problem: Respiratory: Goal: Ability to maintain adequate ventilation will improve Outcome: Progressing   "

## 2024-11-11 LAB — CBC
HCT: 28.2 % — ABNORMAL LOW (ref 39.0–52.0)
Hemoglobin: 9.1 g/dL — ABNORMAL LOW (ref 13.0–17.0)
MCH: 31.7 pg (ref 26.0–34.0)
MCHC: 32.3 g/dL (ref 30.0–36.0)
MCV: 98.3 fL (ref 80.0–100.0)
Platelets: 436 K/uL — ABNORMAL HIGH (ref 150–400)
RBC: 2.87 MIL/uL — ABNORMAL LOW (ref 4.22–5.81)
RDW: 14 % (ref 11.5–15.5)
WBC: 10.9 K/uL — ABNORMAL HIGH (ref 4.0–10.5)
nRBC: 0 % (ref 0.0–0.2)

## 2024-11-11 LAB — BASIC METABOLIC PANEL WITH GFR
Anion gap: 9 (ref 5–15)
BUN: 13 mg/dL (ref 6–20)
CO2: 30 mmol/L (ref 22–32)
Calcium: 10.2 mg/dL (ref 8.9–10.3)
Chloride: 95 mmol/L — ABNORMAL LOW (ref 98–111)
Creatinine, Ser: 0.51 mg/dL — ABNORMAL LOW (ref 0.61–1.24)
GFR, Estimated: >60 mL/min
Glucose, Bld: 89 mg/dL (ref 70–99)
Potassium: 4.3 mmol/L (ref 3.5–5.1)
Sodium: 134 mmol/L — ABNORMAL LOW (ref 135–145)

## 2024-11-11 MED ORDER — AMOXICILLIN-POT CLAVULANATE 875-125 MG PO TABS: 1.0000 | ORAL_TABLET | Freq: Two times a day (BID) | ORAL | 0 refills | Status: AC

## 2024-11-11 NOTE — Discharge Summary (Addendum)
 " Physician Discharge Summary   Patient: Donald Stewart MRN: [id number] DOB: 07-04-1971  Admit date:     11/05/2024  Discharge date: 11/11/2024  Discharge Physician: Deliliah Room   PCP: [name], MD   Recommendations at discharge:    F/u with your PCP in one week F/u with pulmonologist, Dr Kassie on scheduled appointment, 12/03/24 Continue taking meds as prescribed.  Discharge Diagnoses: Principal Problem:   Necrotizing pneumonia (HCC) Active Problems:   Sepsis (HCC)   Primary hypertension   Chronic obstructive pulmonary disease (HCC)   Alcoholic liver disease  Hospital Course:  53 y.o. male with medical history significant for alcohol abuse with Warnicke encephalopathy, and polyneuropathy,COPD, HTN, tobacco abuse admitted on 11/05/2024 with sepsis secondary to Necrotizing Pneumonia.  Sepsis secondary to Necrotizing Pneumonia: Sepsis resolved. CTA chest on admission- Findings suspected to represent right lower lobe necrotizing pneumonia and pulmonary abscess in the appropriate clinical setting.  Pulmonologist Dr. Kassie recommends IV Vanco and Zosyn  in the hospital, also stated that no need for transfer to [facility], no indication for bronchoscopy at this time, needs to follow-up with pulmonology for likely repeat imaging, needs extended course of antibiotics for 2 weeks, and on discharge strongly recommends patient to be discharged on Augmentin  to complete 2 weeks of ABX treatment.   Patient to follow-up with pulmonologist Dr. Kassie as an outpatient on 12/03/24. - Continue bronchodilators -No hypoxia  -Blood cultures NGTD    Alcoholic liver disease-recent hospitalization for delirium tremens.  Patient has not had any alcoholic beverage since hospitalization 11/29.   -Continue multivitamin, folic acid  and thiamine    COPD -stable. - Outpt pulm follow up   Generalized Weakness and deconditioning with ambulatory dysfunction-Outpatient PT arranged.  Disposition: Home  with Outpatient PT.      Consultants: Pulm Procedures performed: None  Disposition: Home health Diet recommendation:  Cardiac diet DISCHARGE MEDICATION: Allergies as of 11/11/2024   No Known Allergies      Medication List     STOP taking these medications    cefadroxil  500 MG capsule Commonly known as: DURICEF   fluticasone  50 MCG/ACT nasal spray Commonly known as: FLONASE    levofloxacin  750 MG tablet Commonly known as: LEVAQUIN        TAKE these medications    acetaminophen  325 MG tablet Commonly known as: TYLENOL  Take 2 tablets (650 mg total) by mouth every 6 (six) hours as needed for mild pain (pain score 1-3) or fever (or Fever >/= 101). What changed:  when to take this reasons to take this   albuterol  108 (90 Base) MCG/ACT inhaler Commonly known as: VENTOLIN  HFA Inhale 2 puffs into the lungs every 6 (six) hours as needed for wheezing or shortness of breath.   amoxicillin -clavulanate 875-125 MG tablet Commonly known as: AUGMENTIN  Take 1 tablet by mouth 2 (two) times daily for 10 days.   ENSURE PLUS PO Take 8 fluid ounces by mouth 2 (two) times daily. Vanilla   folic acid  1 MG tablet Commonly known as: FOLVITE  Take 1 tablet (1 mg total) by mouth daily.   guaiFENesin  100 MG/5ML liquid Commonly known as: ROBITUSSIN Take 10 mLs by mouth every 4 (four) hours as needed for cough or to loosen phlegm. What changed: Another medication with the same name was removed. Continue taking this medication, and follow the directions you see here.   ipratropium-albuterol  0.5-2.5 (3) MG/3ML Soln Commonly known as: DUONEB Take 3 mLs by nebulization every 6 (six) hours as needed.   loratadine 10 MG tablet  Commonly known as: CLARITIN Take 10 mg by mouth at bedtime.   magnesium  oxide 400 (240 Mg) MG tablet Commonly known as: MAG-OX Take 400 mg by mouth daily.   multivitamin with minerals Tabs tablet Take 1 tablet by mouth daily.   thiamine  100 MG  tablet Commonly known as: Vitamin B-1 Take 1 tablet (100 mg total) by mouth daily.        Contact information for follow-up providers     [name], MD. Schedule an appointment as soon as possible for a visit in 1 week(s).   Specialty: Internal Medicine Contact information: [address] [place] [address] [phone]         Kassie Acquanetta Bradley, MD. Go on 12/03/2024.   Specialty: Pulmonary Disease Contact information: [address] [place] [address] [phone]              Contact information for after-discharge care     Destination     [facility] .   Service: Skilled Nursing Contact information: [address] [place]  [address] [phone]                    Discharge Exam: Fredricka Weights   11/05/24 1332  Weight: 52.2 kg   Constitutional: NAD, calm, comfortable Eyes: PERRL, lids and conjunctivae normal ENMT: Mucous membranes are moist. Posterior pharynx clear of any exudate or lesions.Normal dentition.  Neck: normal, supple, no masses, no thyromegaly Respiratory: clear to auscultation bilaterally, no wheezing, no crackles. Normal respiratory effort. No accessory muscle use.  Cardiovascular: Regular rate and rhythm, no murmurs / rubs / gallops. No extremity edema. 2+ pedal pulses. No carotid bruits.  Abdomen: no tenderness, no masses palpated. No hepatosplenomegaly. Bowel sounds positive.  Musculoskeletal: no clubbing / cyanosis. No joint deformity upper and lower extremities. Good ROM, no contractures. Normal muscle tone.  Skin: no rashes, lesions, ulcers. No induration Neurologic: CN 2-12 grossly intact. Sensation intact, DTR normal. Strength 5/5 x all 4 extremities.  Psychiatric: Normal judgment and insight. Alert and oriented x 3. Normal mood.    Condition at discharge: good  The results of significant diagnostics from this hospitalization  (including imaging, microbiology, ancillary and laboratory) are listed below for reference.   Imaging Studies: CT Angio Chest PE W and/or Wo Contrast Result Date: 11/05/2024 CLINICAL DATA:  Sepsis right flank pain EXAM: CT ANGIOGRAPHY CHEST CT ABDOMEN AND PELVIS WITH CONTRAST TECHNIQUE: Multidetector CT imaging of the chest was performed using the standard protocol during bolus administration of intravenous contrast. Multiplanar CT image reconstructions and MIPs were obtained to evaluate the vascular anatomy. Multidetector CT imaging of the abdomen and pelvis was performed using the standard protocol during bolus administration of intravenous contrast. RADIATION DOSE REDUCTION: This exam was performed according to the departmental dose-optimization program which includes automated exposure control, adjustment of the mA and/or kV according to patient size and/or use of iterative reconstruction technique. CONTRAST:  OMNIPAQUE  IOHEXOL  350 MG/ML SOLN COMPARISON:  Chest x-ray 11/05/2024, ultrasound 09/23/2023 FINDINGS: CTA CHEST FINDINGS Cardiovascular: Satisfactory opacification of the pulmonary arteries to the segmental level. No evidence of pulmonary embolism. Nonaneurysmal aorta. No dissection. Normal cardiac size. No pericardial effusion Mediastinum/Nodes: Patent trachea. No suspicious thyroid mass. Mild right hilar nodes measuring up to 12 mm. Esophagus within normal limits. Lungs/Pleura: Focal consolidation in the right lower lobe. Central low density with a few gas bubbles measuring 3.8 x 2.6 cm on the lung bases of  the abdomen CT. 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 Chronic appearing irregularity of the mid sternum. Review of the MIP images confirms the above findings. CT ABDOMEN and PELVIS FINDINGS Hepatobiliary: Subcentimeter hypodensities in the liver too small to further characterize. Contracted gallbladder without calcified stone. No biliary dilatation Pancreas: Unremarkable. No pancreatic  ductal dilatation or surrounding inflammatory changes. Spleen: Normal in size without focal abnormality. Adrenals/Urinary Tract: Adrenal glands are normal. Kidneys show no hydronephrosis. Diffuse thick-walled appearance of the bladder Stomach/Bowel: Stomach within normal limits. No dilated small bowel. No acute bowel wall thickening. Negative appendix Vascular/Lymphatic: Aortic atherosclerosis. No enlarged abdominal or pelvic lymph nodes. Reproductive: Prostate is unremarkable. Other: No ascites or free air Musculoskeletal: Right hip replacement with artifact. 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 Review of the MIP images confirms the above findings. IMPRESSION: 1. Negative for acute pulmonary embolus or aortic dissection. 2. Focal consolidation in the right lower lobe with central rim enhancing low-density area and gas bubbles. Findings suspected to represent right lower lobe necrotizing pneumonia and pulmonary abscess in the appropriate clinical setting. Imaging follow-up to resolution recommended to exclude underlying mass. 3. Diffuse thick-walled appearance of the bladder, question cystitis. 4. 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 5. Aortic atherosclerosis. Aortic Atherosclerosis (ICD10-I70.0). Electronically Signed   By: Luke Bun M.D.   On: 11/05/2024 16:11   CT ABDOMEN PELVIS W CONTRAST Result Date: 11/05/2024 CLINICAL DATA:  Sepsis right flank pain EXAM: CT ANGIOGRAPHY CHEST CT ABDOMEN AND PELVIS WITH CONTRAST TECHNIQUE: Multidetector CT imaging of the chest was performed using the standard protocol during bolus administration of intravenous contrast. Multiplanar CT image reconstructions and MIPs were obtained to evaluate the vascular anatomy. Multidetector CT imaging of the abdomen and pelvis was performed using the standard protocol during bolus administration of intravenous contrast. RADIATION DOSE REDUCTION: This exam was performed according to the departmental  dose-optimization program which includes automated exposure control, adjustment of the mA and/or kV according to patient size and/or use of iterative reconstruction technique. CONTRAST:  OMNIPAQUE  IOHEXOL  350 MG/ML SOLN COMPARISON:  Chest x-ray 11/05/2024, ultrasound 09/23/2023 FINDINGS: CTA CHEST FINDINGS Cardiovascular: Satisfactory opacification of the pulmonary arteries to the segmental level. No evidence of pulmonary embolism. Nonaneurysmal aorta. No dissection. Normal cardiac size. No pericardial effusion Mediastinum/Nodes: Patent trachea. No suspicious thyroid mass. Mild right hilar nodes measuring up to 12 mm. Esophagus within normal limits. Lungs/Pleura: Focal consolidation in the right lower lobe. Central low density with a few gas bubbles measuring 3.8 x 2.6 cm on the lung bases of the abdomen CT. 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 Chronic appearing irregularity of the mid sternum. Review of the MIP images confirms the above findings. CT ABDOMEN and PELVIS FINDINGS Hepatobiliary: Subcentimeter hypodensities in the liver too small to further characterize. Contracted gallbladder without calcified stone. No biliary dilatation Pancreas: Unremarkable. No pancreatic ductal dilatation or surrounding inflammatory changes. Spleen: Normal in size without focal abnormality. Adrenals/Urinary Tract: Adrenal glands are normal. Kidneys show no hydronephrosis. Diffuse thick-walled appearance of the bladder Stomach/Bowel: Stomach within normal limits. No dilated small bowel. No acute bowel wall thickening. Negative appendix Vascular/Lymphatic: Aortic atherosclerosis. No enlarged abdominal or pelvic lymph nodes. Reproductive: Prostate is unremarkable. Other: No ascites or free air Musculoskeletal: Right hip replacement with artifact. 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 Review of the MIP images confirms the above findings. IMPRESSION: 1. Negative for acute pulmonary embolus or aortic dissection. 2.  Focal consolidation in the right lower lobe with central rim enhancing  low-density area and gas bubbles. Findings suspected to represent right lower lobe necrotizing pneumonia and pulmonary abscess in the appropriate clinical setting. Imaging follow-up to resolution recommended to exclude underlying mass. 3. Diffuse thick-walled appearance of the bladder, question cystitis. 4. 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 5. Aortic atherosclerosis. Aortic Atherosclerosis (ICD10-I70.0). Electronically Signed   By: Luke Bun M.D.   On: 11/05/2024 16:11   CT Soft Tissue Neck W Contrast Result Date: 11/05/2024 EXAM: CT NECK WITH CONTRAST 11/05/2024 03:34:00 PM TECHNIQUE: CT of the neck was performed with the administration of 100 mL of iohexol  (OMNIPAQUE ) 350 MG/ML injection. Multiplanar reformatted images are provided for review. Automated exposure control, iterative reconstruction, and/or weight based adjustment of the mA/kV was utilized to reduce the radiation dose to as low as reasonably achievable. COMPARISON: None available. CLINICAL HISTORY: dysphagia, sepsis FINDINGS: AERODIGESTIVE TRACT: No discrete mass. No edema. SALIVARY GLANDS: The parotid and submandibular glands are unremarkable. THYROID: Unremarkable. LYMPH NODES: No suspicious cervical lymphadenopathy. SOFT TISSUES: No mass or fluid collection. BONES: Mild cervical spondylosis. OTHER: Visualized sinuses and mastoid air cells are well aerated. Chest reported separately. IMPRESSION: 1. No acute abnormality in the neck. Electronically signed by: Dasie Hamburg MD 11/05/2024 03:44 PM EST RP Workstation: [id number]   DG Chest Portable 1 View Result Date: 11/05/2024 CLINICAL DATA:  Dysphagia and cough. EXAM: PORTABLE CHEST 1 VIEW COMPARISON:  October 10, 2024 FINDINGS: The heart size and mediastinal contours are within normal limits. Low lung volumes are noted with mild right infrahilar atelectasis and/or infiltrate. No pleural effusion or  pneumothorax is identified. The visualized skeletal structures are unremarkable. IMPRESSION: Low lung volumes with mild right infrahilar atelectasis and/or infiltrate. Electronically Signed   By: Suzen Dials M.D.   On: 11/05/2024 14:33    Microbiology: Results for orders placed or performed during the hospital encounter of 11/05/24  Resp panel by RT-PCR (RSV, Flu A&B, Covid) Anterior Nasal Swab     Status: None   Collection Time: 11/05/24  2:58 PM   Specimen: Anterior Nasal Swab  Result Value Ref Range Status   SARS Coronavirus 2 by RT PCR NEGATIVE NEGATIVE Final    Comment: (NOTE) SARS-CoV-2 target nucleic acids are NOT DETECTED.  The SARS-CoV-2 RNA is generally detectable in upper respiratory specimens during the acute phase of infection. The lowest concentration of SARS-CoV-2 viral copies this assay can detect is 138 copies/mL. A negative result does not preclude SARS-Cov-2 infection and should not be used as the sole basis for treatment or other patient management decisions. A negative result may occur with  improper specimen collection/handling, submission of specimen other than nasopharyngeal swab, presence of viral mutation(s) within the areas targeted by this assay, and inadequate number of viral copies(<138 copies/mL). A negative result must be combined with clinical observations, patient history, and epidemiological information. The expected result is Negative.  Fact Sheet for Patients:  bloggercourse.com  Fact Sheet for Healthcare Providers:  seriousbroker.it  This test is no t yet approved or cleared by the [place]  FDA and  has been authorized for detection and/or diagnosis of SARS-CoV-2 by FDA under an Emergency Use Authorization (EUA). This EUA will remain  in effect (meaning this test can be used) for the duration of the COVID-19 declaration under Section 564(b)(1) of the Act, 21 U.S.C.section  360bbb-3(b)(1), unless the authorization is terminated  or revoked sooner.       Influenza A by PCR NEGATIVE NEGATIVE Final   Influenza B by PCR NEGATIVE NEGATIVE Final  Comment: (NOTE) The Xpert Xpress SARS-CoV-2/FLU/RSV plus assay is intended as an aid in the diagnosis of influenza from Nasopharyngeal swab specimens and should not be used as a sole basis for treatment. Nasal washings and aspirates are unacceptable for Xpert Xpress SARS-CoV-2/FLU/RSV testing.  Fact Sheet for Patients: bloggercourse.com  Fact Sheet for Healthcare Providers: seriousbroker.it  This test is not yet approved or cleared by the [place]  FDA and has been authorized for detection and/or diagnosis of SARS-CoV-2 by FDA under an Emergency Use Authorization (EUA). This EUA will remain in effect (meaning this test can be used) for the duration of the COVID-19 declaration under Section 564(b)(1) of the Act, 21 U.S.C. section 360bbb-3(b)(1), unless the authorization is terminated or revoked.     Resp Syncytial Virus by PCR NEGATIVE NEGATIVE Final    Comment: (NOTE) Fact Sheet for Patients: bloggercourse.com  Fact Sheet for Healthcare Providers: seriousbroker.it  This test is not yet approved or cleared by the [place]  FDA and has been authorized for detection and/or diagnosis of SARS-CoV-2 by FDA under an Emergency Use Authorization (EUA). This EUA will remain in effect (meaning this test can be used) for the duration of the COVID-19 declaration under Section 564(b)(1) of the Act, 21 U.S.C. section 360bbb-3(b)(1), unless the authorization is terminated or revoked.  Performed at [facility], [address], [place], [place] [address]   Blood culture (routine x 2)     Status: None   Collection Time: 11/05/24  2:58 PM   Specimen: BLOOD  Result Value Ref Range Status   Specimen Description  BLOOD BLOOD RIGHT FOREARM  Final   Special Requests   Final    BOTTLES DRAWN AEROBIC AND ANAEROBIC Blood Culture adequate volume   Culture   Final    NO GROWTH 5 DAYS Performed at [facility], [address] [place], [place] [address]    Report Status 11/10/2024 FINAL  Final  Blood culture (routine x 2)     Status: None   Collection Time: 11/05/24  3:34 PM   Specimen: BLOOD  Result Value Ref Range Status   Specimen Description BLOOD LEFT ANTECUBITAL  Final   Special Requests   Final    BOTTLES DRAWN AEROBIC AND ANAEROBIC Blood Culture results may not be optimal due to an inadequate volume of blood received in culture bottles   Culture   Final    NO GROWTH 5 DAYS Performed at [facility], [address] [place], [place] [address]    Report Status 11/10/2024 FINAL  Final  MRSA Next Gen by PCR, Nasal     Status: None   Collection Time: 11/09/24  4:30 PM   Specimen: Nasal Mucosa; Nasal Swab  Result Value Ref Range Status   MRSA by PCR Next Gen NOT DETECTED NOT DETECTED Final    Comment: (NOTE) The GeneXpert MRSA Assay (FDA approved for NASAL specimens only), is one component of a comprehensive MRSA colonization surveillance program. It is not intended to diagnose MRSA infection nor to guide or monitor treatment for MRSA infections. Test performance is not FDA approved in patients less than 62 years old. Performed at [facility], [address] [place], [place] [address]     Labs: CBC: Recent Labs  Lab 11/05/24 1350 11/06/24 0449 11/08/24 0450 11/09/24 0516 11/11/24 0502  WBC 24.5* 19.7* 17.2* 13.9* 10.9*  NEUTROABS 19.5*  --   --   --   --   HGB 10.1* 8.7* 9.0* 9.1* 9.1*  HCT 30.5* 26.7* 26.5* 28.0*  28.2*  MCV 99.7 101.5* 96.7 98.6 98.3  PLT 338 309 339 359 436*   Basic Metabolic Panel: Recent Labs  Lab 11/05/24 1350 11/06/24 0449 11/09/24 0516 11/11/24 0502  NA 136 135 132* 134*  K 4.0 4.0 3.9 4.3  CL 94* 95* 93* 95*  CO2 24 32 29 30  GLUCOSE 94 91 104*  89  BUN 13 10 10 13   CREATININE 0.85 0.70 0.60* 0.51*  CALCIUM 10.4* 9.7 9.9 10.2  PHOS  --   --  4.0  --    Liver Function Tests: Recent Labs  Lab 11/05/24 1350 11/09/24 0516  AST 42*  --   ALT 29  --   ALKPHOS 82  --   BILITOT 1.0  --   PROT 8.4*  --   ALBUMIN 4.2 3.5   CBG: Recent Labs  Lab 11/05/24 2249  GLUCAP 105*    Discharge time spent: 40 minutes.  Signed: Deliliah Room, MD [facility] 11/11/2024 "

## 2024-11-11 NOTE — Plan of Care (Signed)
°  Problem: Education: Goal: Knowledge of General Education information will improve Description: Including pain rating scale, medication(s)/side effects and non-pharmacologic comfort measures Outcome: Progressing   Problem: Clinical Measurements: Goal: Ability to maintain clinical measurements within normal limits will improve Outcome: Progressing   Problem: Activity: Goal: Risk for activity intolerance will decrease Outcome: Progressing   Problem: Pain Managment: Goal: General experience of comfort will improve and/or be controlled Outcome: Progressing   Problem: Safety: Goal: Ability to remain free from injury will improve Outcome: Progressing   Problem: Activity: Goal: Ability to tolerate increased activity will improve Outcome: Progressing

## 2024-11-11 NOTE — Progress Notes (Signed)
 Physical Therapy Treatment Patient Details Name: Donald Stewart MRN: [id number] DOB: 01/19/1971 Today's Date: 11/11/2024   History of Present Illness Donald Stewart is a 53 y.o. male with medical history significant for alcohol abuse, COPD, hypertension, tobacco abuse, neuropathy.     Patient presented to the ED with complaints of right flank pain of 1 week duration.  On evaluation, patient is awake alert oriented to person and place, he is able to answer questions but barely responds yes or no to any questions asked.  Per family this is his baseline.  Denies difficulty breathing, denies cough.  No fevers no chills.  No urinary symptoms.    PT Comments  Patient demonstrates good return for bed mobility and transferring to/from chair without AD, unsteady on feet when taking steps without AD, safer using RW and demonstrates good carryover for use during ambulation in room, hallway without loss of balance. Patient tolerated sitting up in chair after therapy. Patient will benefit from continued skilled physical therapy in hospital and recommended venue below to increase strength, balance, endurance for safe ADLs and gait.      If plan is discharge home, recommend the following: Help with stairs or ramp for entrance;Assist for transportation;A little help with walking and/or transfers;A little help with bathing/dressing/bathroom   Can travel by private vehicle     Yes  Equipment Recommendations  None recommended by PT    Recommendations for Other Services       Precautions / Restrictions Precautions Precautions: Fall Recall of Precautions/Restrictions: Intact Restrictions Weight Bearing Restrictions Per Provider Order: No     Mobility  Bed Mobility Overal bed mobility: Modified Independent       Supine to sit: Modified independent (Device/Increase time)     General bed mobility comments: good return for getting into/out of bed with bed flat, no use of bed rails     Transfers Overall transfer level: Needs assistance Equipment used: Rolling walker (2 wheels), None, 1 person hand held assist Transfers: Sit to/from Stand, Bed to chair/wheelchair/BSC Sit to Stand: Supervision, Contact guard assist   Step pivot transfers: Supervision, Modified independent (Device/Increase time)       General transfer comment: good return for transferring to/from chair without AD    Ambulation/Gait Ambulation/Gait assistance: Supervision, Contact guard assist Gait Distance (Feet): 120 Feet Assistive device: None, Rolling walker (2 wheels) Gait Pattern/deviations: Decreased step length - right, Decreased step length - left, Decreased stride length Gait velocity: decreased     General Gait Details: when taking steps without AD had to lean on nearby objects for support due to fair/poor standing balance, safer using RW with good return for ambulating in room, hallway without loss of balance   Stairs             Wheelchair Mobility     Tilt Bed    Modified Rankin (Stroke Patients Only)       Balance Overall balance assessment: Needs assistance Sitting-balance support: Feet supported, No upper extremity supported Sitting balance-Leahy Scale: Good Sitting balance - Comments: seated EOB   Standing balance support: During functional activity, No upper extremity supported Standing balance-Leahy Scale: Poor Standing balance comment: fair/poor without AD, fair/good using RW                            Communication Communication Communication: No apparent difficulties  Cognition Arousal: Alert Behavior During Therapy: WFL for tasks assessed/performed   PT - Cognitive impairments: No  apparent impairments                         Following commands: Intact      Cueing Cueing Techniques: Verbal cues  Exercises General Exercises - Lower Extremity Long Arc Quad: Seated, AROM, Strengthening, Both, 10 reps Hip Flexion/Marching:  AROM, Strengthening, Both, 10 reps, Seated Toe Raises: Seated, AROM, Strengthening, Both, 10 reps Heel Raises: Seated, AROM, Strengthening, Both, 10 reps    General Comments        Pertinent Vitals/Pain Pain Assessment Pain Assessment: No/denies pain    Home Living                          Prior Function            PT Goals (current goals can now be found in the care plan section) Acute Rehab PT Goals Patient Stated Goal: Pt would like to go home. PT Goal Formulation: With patient Time For Goal Achievement: 11/11/24 Potential to Achieve Goals: Good Progress towards PT goals: Progressing toward goals    Frequency    Min 3X/week      PT Plan      Co-evaluation              AM-PAC PT 6 Clicks Mobility   Outcome Measure  Help needed turning from your back to your side while in a flat bed without using bedrails?: None Help needed moving from lying on your back to sitting on the side of a flat bed without using bedrails?: None Help needed moving to and from a bed to a chair (including a wheelchair)?: A Little Help needed standing up from a chair using your arms (e.g., wheelchair or bedside chair)?: None Help needed to walk in hospital room?: A Little Help needed climbing 3-5 steps with a railing? : A Lot 6 Click Score: 20    End of Session   Activity Tolerance: Patient tolerated treatment well;Patient limited by fatigue Patient left: in chair;with call bell/phone within reach Nurse Communication: Mobility status PT Visit Diagnosis: Unsteadiness on feet (R26.81);Other abnormalities of gait and mobility (R26.89);Muscle weakness (generalized) (M62.81)     Time: 8967-8947 PT Time Calculation (min) (ACUTE ONLY): 20 min  Charges:    $Gait Training: 8-22 mins $Therapeutic Exercise: 8-22 mins PT General Charges $$ ACUTE PT VISIT: 1 Visit                     2:22 PM, 11/11/2024 Lynwood Music, MPT Physical Therapist with [facility] [phone] office [phone] mobile phone

## 2024-11-11 NOTE — Progress Notes (Signed)
 Mobility Specialist Progress Note:    11/11/24 0935  Mobility  Activity Ambulated with assistance  Level of Assistance Contact guard assist, steadying assist  Assistive Device Other (Comment) (1 HHA)  Distance Ambulated (ft) 20 ft  Range of Motion/Exercises Active;All extremities  Activity Response Tolerated well  Mobility Referral Yes  Mobility visit 1 Mobility  Mobility Specialist Start Time (ACUTE ONLY) 0935  Mobility Specialist Stop Time (ACUTE ONLY) 0955  Mobility Specialist Time Calculation (min) (ACUTE ONLY) 20 min   Pt received supine, requesting assistance to bathroom. Required CGA to stand and ambulate with 1 hand-held assist. Tolerated well, asx throughout. Left in chair, call bell in reach. All needs met.  Keimon Basaldua Mobility Specialist Please contact via Special Educational Needs Teacher or  Rehab office at [phone]

## 2024-11-11 NOTE — Progress Notes (Signed)
 Patient discharged home accompanied by mother. Discharge paperwork and instructions provided. No concerns voiced.

## 2024-11-11 NOTE — TOC Transition Note (Addendum)
 Transition of Care [facility]) - Discharge Note   Patient Details  Name: Donald Stewart MRN: [id number] Date of Birth: [birth year]  [facility]) CM/SW Contact:  Sharlyne Stabs, RN Phone Number: 11/11/2024, 12:11 PM   Clinical Narrative:   Insurance still reviewing, auth pending. Patient did well with PT, and recommendation now is HHPT.  IPCM at the bedside. Patient wants to go home. Mother is here to transport him. They requested out patient PT in [place], Orders placed.   [facility] updated to cancel bed offer and Auth.    Final next level of care: Home/Self Care Barriers to Discharge: Barriers Resolved   Patient Goals and CMS Choice Patient states their goals for this hospitalization and ongoing recovery are:: get stronger CMS Medicare.gov Compare Post Acute Care list provided to:: Patient Choice offered to / list presented to : Patient [place] ownership interest in [facility].provided to:: Patient    Discharge Placement                Patient to be transferred to facility by: Family   Patient and family notified of of transfer: 11/11/24  Discharge Plan and Services Additional resources added to the After Visit Summary for   In-house Referral: Clinical Social Work Discharge Planning Services: CM Consult Post Acute Care Choice: Skilled Nursing Facility                Social Drivers of Health (SDOH) Interventions SDOH Screenings   Food Insecurity: No Food Insecurity (11/05/2024)  Housing: Unknown (11/05/2024)  Transportation Needs: Patient Unable To Answer (11/05/2024)  Utilities: Not At Risk (11/05/2024)  Depression (PHQ2-9): Low Risk (07/30/2024)  Tobacco Use: High Risk (11/06/2024)     Readmission Risk Interventions    10/21/2024    1:22 PM  Readmission Risk Prevention Plan  Post Dischage Appt Complete  Medication Screening Complete  Transportation Screening Complete

## 2024-11-13 ENCOUNTER — Inpatient Hospital Stay: Payer: Self-pay | Admitting: Internal Medicine

## 2024-11-17 ENCOUNTER — Ambulatory Visit: Payer: Self-pay | Admitting: Internal Medicine

## 2024-11-17 ENCOUNTER — Encounter: Payer: Self-pay | Admitting: Internal Medicine

## 2024-11-17 VITALS — BP 119/80 | HR 88 | Ht 67.0 in | Wt 118.0 lb

## 2024-11-17 DIAGNOSIS — R5381 Other malaise: Secondary | ICD-10-CM | POA: Insufficient documentation

## 2024-11-17 DIAGNOSIS — J850 Gangrene and necrosis of lung: Secondary | ICD-10-CM

## 2024-11-17 DIAGNOSIS — F1029 Alcohol dependence with unspecified alcohol-induced disorder: Secondary | ICD-10-CM

## 2024-11-17 DIAGNOSIS — Z09 Encounter for follow-up examination after completed treatment for conditions other than malignant neoplasm: Secondary | ICD-10-CM

## 2024-11-17 DIAGNOSIS — I10 Essential (primary) hypertension: Secondary | ICD-10-CM | POA: Diagnosis not present

## 2024-11-17 DIAGNOSIS — J449 Chronic obstructive pulmonary disease, unspecified: Secondary | ICD-10-CM | POA: Diagnosis not present

## 2024-11-17 MED ORDER — ALBUTEROL SULFATE HFA 108 (90 BASE) MCG/ACT IN AERS: 2.0000 | INHALATION_SPRAY | Freq: Four times a day (QID) | RESPIRATORY_TRACT | 1 refills | Status: AC | PRN

## 2024-11-17 NOTE — Assessment & Plan Note (Addendum)
 Usually takes about 3-4 alcoholic drinks in a day, was admitted recently for acute metabolic  encephalopathy due to alcohol intoxication Has quit alcohol use since last hospitalization Needs to abstain from alcohol use AA support groups information have been provided previously

## 2024-11-17 NOTE — Assessment & Plan Note (Addendum)
 Usually well-controlled Albuterol  PRN for dyspnea or wheezing, refilled Needs to cut down alcohol use and quit smoking Needs pulmonology evaluation for lung abscess

## 2024-11-17 NOTE — Assessment & Plan Note (Addendum)
 CTA chest showed necrotizing pneumonia, and concerning lung abscess Was given IV vancomycin  and Zosyn  during hospitalization, followed by oral Augmentin  Advised to follow-up with pulmonology for follow-up imaging

## 2024-11-17 NOTE — Patient Instructions (Signed)
 Please continue protein supplements and Vitamins as discussed.  Please abstain from alcohol use.  Please complete course of Augmentin  and follow-up with Pulmonology.

## 2024-11-17 NOTE — Assessment & Plan Note (Addendum)
 Hospital chart reviewed, including discharge summary Medications reconciled and reviewed with the patient in detail

## 2024-11-17 NOTE — Assessment & Plan Note (Addendum)
 Due to recent prolonged hospitalization Referred to outpatient physical therapy FMLA paperwork to be filled for 2 weeks upon receiving from workplace

## 2024-11-17 NOTE — Progress Notes (Signed)
 "  Established Patient Office Visit  Subjective:  Patient ID: Donald Stewart, male    DOB: [birth year]  Age: 54 y.o. MRN: [id number]  CC:  Chief Complaint  Patient presents with   Hospitalization Follow-up    D/c on 11/11/24    HPI Donald Stewart is a 54 y.o. male with past medical history of HTN, HLD, alcohol and tobacco abuse who presents for follow-up after recent hospitalization from 11/05/24-11/11/24 and 10/10/24-10/21/24.  First hospitalization: He was found to be unresponsive, had blood sugar of 15 in the setting of heavy alcohol use over the prior 2 days, per family members.  EMS gave him IM glucagon due to inability to obtain IV access initially, followed by IV dextrose .  Chest x-ray and CT head were unremarkable for any acute etiology.  UDS was negative.  He had borderline low BP during hospitalization, his amlodipine  was discontinued.  He was discharged to [facility] for physical therapy.  He could not participate much for physical therapy, and continued to get worse in the [facility], for which he was admitted again on 11/05/24.  Second hospitalization: CTA chest on admission showed right sided lower lobe necrotizing pneumonia and possible pulmonary abscess.  He was evaluated by pulmonology, who recommended IV vancomycin  and Zosyn , and he improved clinically.  He was discharged with oral Augmentin , which he is currently taking.  He has follow-up with pulmonology in [place], but prefers to see them in [place].  Denies any fever or chills currently.  Denies overt cough, hemoptysis, nausea or vomiting.  His appetite is improving and he is taking protein supplements currently.  He still reports bilateral leg weakness, and has not been able to return to work yet.  He is able to walk short distances in the home, but is unable to stand for more than 15 minutes continuously.  Does not report any new numbness or tingling of the LE.    Past Medical History:  Diagnosis Date   Asthma     Avascular necrosis of hip, right (HCC) 06/08/2021   Hypercholesterolemia    Hypertension     Past Surgical History:  Procedure Laterality Date   TOTAL HIP ARTHROPLASTY Right     Family History  Problem Relation Age of Onset   Hypertension Mother     Social History   Socioeconomic History   Marital status: Divorced    Spouse name: Not on file   Number of children: Not on file   Years of education: Not on file   Highest education level: Not on file  Occupational History   Not on file  Tobacco Use   Smoking status: Every Day    Current packs/day: 0.50    Average packs/day: 0.5 packs/day for 20.0 years (10.0 ttl pk-yrs)    Types: Cigarettes   Smokeless tobacco: Never   Tobacco comments:    1 1/2 cig a day  Vaping Use   Vaping status: Never Used  Substance and Sexual Activity   Alcohol use: Yes    Comment: drinks until he passes out daily. has no desire to stop   Drug use: No   Sexual activity: Not on file  Other Topics Concern   Not on file  Social History Narrative   Not on file   Social Drivers of Health   Tobacco Use: High Risk (11/17/2024)   Patient History    Smoking Tobacco Use: Every Day    Smokeless Tobacco Use: Never    Passive Exposure: Not on file  Financial Resource Strain: Not on file  Food Insecurity: No Food Insecurity (11/05/2024)   Epic    Worried About Programme Researcher, Broadcasting/film/video in the Last Year: Never true    Ran Out of Food in the Last Year: Never true  Transportation Needs: Patient Unable To Answer (11/05/2024)   Epic    Lack of Transportation (Medical): Patient unable to answer    Lack of Transportation (Non-Medical): Patient unable to answer  Physical Activity: Not on file  Stress: Not on file  Social Connections: Not on file  Intimate Partner Violence: Not At Risk (11/05/2024)   Epic    Fear of Current or Ex-Partner: No    Emotionally Abused: No    Physically Abused: No    Sexually Abused: No  Depression (PHQ2-9): Low Risk (11/17/2024)    Depression (PHQ2-9)    PHQ-2 Score: 0  Alcohol Screen: Not on file  Housing: Unknown (11/05/2024)   Epic    Unable to Pay for Housing in the Last Year: Patient unable to answer    Number of Times Moved in the Last Year: 1    Homeless in the Last Year: Patient unable to answer  Utilities: Not At Risk (11/05/2024)   Epic    Threatened with loss of utilities: No  Health Literacy: Not on file    Outpatient Medications Prior to Visit  Medication Sig Dispense Refill   acetaminophen  (TYLENOL ) 325 MG tablet Take 2 tablets (650 mg total) by mouth every 6 (six) hours as needed for mild pain (pain score 1-3) or fever (or Fever >/= 101).     amoxicillin -clavulanate (AUGMENTIN ) 875-125 MG tablet Take 1 tablet by mouth 2 (two) times daily for 10 days. 20 tablet 0   folic acid  (FOLVITE ) 1 MG tablet Take 1 tablet (1 mg total) by mouth daily. 90 tablet 3   guaiFENesin  (ROBITUSSIN) 100 MG/5ML liquid Take 10 mLs by mouth every 4 (four) hours as needed for cough or to loosen phlegm.     ipratropium-albuterol  (DUONEB) 0.5-2.5 (3) MG/3ML SOLN Take 3 mLs by nebulization every 6 (six) hours as needed.     loratadine (CLARITIN) 10 MG tablet Take 10 mg by mouth at bedtime.     magnesium  oxide (MAG-OX) 400 (240 Mg) MG tablet Take 400 mg by mouth daily.     Multiple Vitamin (MULTIVITAMIN WITH MINERALS) TABS tablet Take 1 tablet by mouth daily.     Nutritional Supplements (ENSURE PLUS PO) Take 8 fluid ounces by mouth 2 (two) times daily. Vanilla     thiamine  (VITAMIN B-1) 100 MG tablet Take 1 tablet (100 mg total) by mouth daily.     albuterol  (VENTOLIN  HFA) 108 (90 Base) MCG/ACT inhaler Inhale 2 puffs into the lungs every 6 (six) hours as needed for wheezing or shortness of breath. 8 g 0   No facility-administered medications prior to visit.    Allergies[1]  ROS Review of Systems  Constitutional:  Positive for fatigue. Negative for chills and fever.  HENT:  Negative for congestion and postnasal drip.    Eyes:  Negative for pain and discharge.  Respiratory:  Negative for cough and shortness of breath.   Cardiovascular:  Negative for chest pain and palpitations.  Gastrointestinal:  Negative for diarrhea, nausea and vomiting.  Endocrine: Negative for polydipsia and polyuria.  Genitourinary:  Negative for dysuria and hematuria.  Musculoskeletal:  Negative for neck pain and neck stiffness.  Skin:  Negative for rash.  Neurological:  Positive for weakness (B/l legs). Negative  for dizziness.  Psychiatric/Behavioral:  Negative for agitation and behavioral problems.       Objective:    Physical Exam Vitals reviewed.  Constitutional:      General: He is not in acute distress.    Appearance: He is cachectic. He is not diaphoretic.  HENT:     Head: Normocephalic and atraumatic.     Nose: Congestion present.     Mouth/Throat:     Mouth: Mucous membranes are moist.     Pharynx: No posterior oropharyngeal erythema.  Eyes:     General: No scleral icterus.    Extraocular Movements: Extraocular movements intact.  Cardiovascular:     Rate and Rhythm: Normal rate and regular rhythm.     Heart sounds: Normal heart sounds. No murmur heard. Pulmonary:     Breath sounds: Normal breath sounds. No wheezing or rales.  Musculoskeletal:     Cervical back: Neck supple. No tenderness.     Right lower leg: No edema.     Left lower leg: No edema.  Skin:    General: Skin is warm.     Findings: No rash.  Neurological:     General: No focal deficit present.     Mental Status: He is alert and oriented to person, place, and time.     Sensory: No sensory deficit.     Motor: Weakness (B/l LE - 3/5) present.  Psychiatric:        Mood and Affect: Mood normal.        Behavior: Behavior normal.     BP 119/80   Pulse 88   Ht 5' 7 (1.702 m)   Wt 118 lb (53.5 kg)   SpO2 97%   BMI 18.48 kg/m  Wt Readings from Last 3 Encounters:  11/17/24 118 lb (53.5 kg)  11/05/24 115 lb 1.3 oz (52.2 kg)  10/10/24  115 lb 1.3 oz (52.2 kg)    Lab Results  Component Value Date   TSH 1.500 11/28/2023   Lab Results  Component Value Date   WBC 10.9 (H) 11/11/2024   HGB 9.1 (L) 11/11/2024   HCT 28.2 (L) 11/11/2024   MCV 98.3 11/11/2024   PLT 436 (H) 11/11/2024   Lab Results  Component Value Date   NA 134 (L) 11/11/2024   K 4.3 11/11/2024   CO2 30 11/11/2024   GLUCOSE 89 11/11/2024   BUN 13 11/11/2024   CREATININE 0.51 (L) 11/11/2024   BILITOT 1.0 11/05/2024   ALKPHOS 82 11/05/2024   AST 42 (H) 11/05/2024   ALT 29 11/05/2024   PROT 8.4 (H) 11/05/2024   ALBUMIN 3.5 11/09/2024   CALCIUM 10.2 11/11/2024   ANIONGAP 9 11/11/2024   EGFR 107 07/30/2024   Lab Results  Component Value Date   CHOL 167 11/28/2023   Lab Results  Component Value Date   HDL 88 11/28/2023   Lab Results  Component Value Date   LDLCALC 53 11/28/2023   Lab Results  Component Value Date   TRIG 161 (H) 11/28/2023   Lab Results  Component Value Date   CHOLHDL 1.9 11/28/2023   Lab Results  Component Value Date   HGBA1C 5.0 11/28/2023      Assessment & Plan:   Assessment & Plan Necrotizing pneumonia (HCC) CTA chest showed necrotizing pneumonia, and concerning lung abscess Was given IV vancomycin  and Zosyn  during hospitalization, followed by oral Augmentin  Advised to follow-up with pulmonology for follow-up imaging Hospital discharge follow-up Hospital chart reviewed, including discharge summary Medications reconciled  and reviewed with the patient in detail Alcohol dependence with unspecified alcohol-induced disorder (HCC) Usually takes about 3-4 alcoholic drinks in a day, was admitted recently for acute metabolic  encephalopathy due to alcohol intoxication Has quit alcohol use since last hospitalization Needs to abstain from alcohol use AA support groups information have been provided previously Primary hypertension BP Readings from Last 1 Encounters:  11/17/24 119/80   Well-controlled without  amlodipine  5 mg QD, was discontinued during recent hospitalization Counseled for compliance with the medications Advised DASH diet and moderate exercise/walking, at least 150 mins/week Chronic obstructive pulmonary disease, unspecified COPD type (HCC) Usually well-controlled Albuterol  PRN for dyspnea or wheezing, refilled Needs to cut down alcohol use and quit smoking Needs pulmonology evaluation for lung abscess Physical deconditioning Due to recent prolonged hospitalization Referred to outpatient physical therapy FMLA paperwork to be filled for 2 weeks upon receiving from workplace     Meds ordered this encounter  Medications   albuterol  (VENTOLIN  HFA) 108 (90 Base) MCG/ACT inhaler    Sig: Inhale 2 puffs into the lungs every 6 (six) hours as needed for wheezing or shortness of breath.    Dispense:  17 g    Refill:  1    Okay to substitute to generic/formulary Albuterol .    Follow-up: No follow-ups on file.    Suzzane MARLA Blanch, MD    [1] No Known Allergies  "

## 2024-11-17 NOTE — Assessment & Plan Note (Addendum)
 BP Readings from Last 1 Encounters:  11/17/24 119/80   Well-controlled without amlodipine  5 mg QD, was discontinued during recent hospitalization Counseled for compliance with the medications Advised DASH diet and moderate exercise/walking, at least 150 mins/week

## 2024-11-24 NOTE — Telephone Encounter (Signed)
 Patient mother brought new forms [employer] Disability Noted Copied Scanned Original in provider box Copy at front desk

## 2024-11-25 ENCOUNTER — Ambulatory Visit: Admitting: Internal Medicine

## 2024-11-25 ENCOUNTER — Ambulatory Visit (HOSPITAL_COMMUNITY)
Admission: RE | Admit: 2024-11-25 | Discharge: 2024-11-25 | Disposition: A | Discharge status: Home / Self Care | Source: Ambulatory Visit | Attending: Internal Medicine | Admitting: Internal Medicine

## 2024-11-25 ENCOUNTER — Encounter: Payer: Self-pay | Admitting: Internal Medicine

## 2024-11-25 VITALS — BP 117/81 | HR 83 | Ht 67.0 in | Wt 121.0 lb

## 2024-11-25 DIAGNOSIS — J852 Abscess of lung without pneumonia: Secondary | ICD-10-CM | POA: Insufficient documentation

## 2024-11-25 DIAGNOSIS — J851 Abscess of lung with pneumonia: Secondary | ICD-10-CM

## 2024-11-25 DIAGNOSIS — Z87891 Personal history of nicotine dependence: Secondary | ICD-10-CM | POA: Diagnosis not present

## 2024-11-25 NOTE — Patient Instructions (Addendum)
 To get the most out of exercise, you need to be continuously aware that you are short of breath, but never out of breath, for at least 30 minutes daily. As you improve, it will actually be easier for you to do the same amount of exercise  in  30 minutes so always push to the level where you are short of breath.     Congratulations on not smoking - it's the most important part of your care.   See your dentist as soon as you can to be sure your teeth did not cause your lung infection.   Please remember to go to the  x-ray department  @  [facility] for your tests - we will call you with the results when they are available    Schedule PFTs  and follow up with me same day.     Add: Augmentin  875 mg take one pill twice daily  X 10 days - take at breakfast and supper with large glass of water.  It would help reduce the usual side effects (diarrhea and yeast infections) if you ate cultured yogurt at lunch.

## 2024-11-25 NOTE — Progress Notes (Addendum)
 "   Donald Stewart, male    DOB: [birth year]    MRN: [id number]   Brief patient profile:  54  yobm  quit smoking Nov 2025  referred to pulmonary clinic in [place]  11/25/2024 by [facility]  for post hops f/u.   Pt not previously seen by PCCM service.    Admit date:     11/05/2024  Discharge date: 11/11/2024    Recommendations at discharge:     F/u with your PCP in one week F/u with pulmonologist, Dr Kassie on scheduled appointment, 12/03/24 Continue taking meds as prescribed.   Discharge Diagnoses: Principal Problem:   Necrotizing pneumonia (HCC)   Sepsis (HCC)   Primary hypertension   Chronic obstructive pulmonary disease (HCC)   Alcoholic liver disease   Hospital Course:   54 y.o. male with medical history significant for alcohol abuse with Warnicke encephalopathy, and polyneuropathy,COPD, HTN, tobacco abuse admitted on 11/05/2024 with sepsis secondary to Necrotizing Pneumonia.   Sepsis secondary to Necrotizing Pneumonia: Sepsis resolved. CTA chest on admission- Findings suspected to represent right lower lobe necrotizing pneumonia and pulmonary abscess in the appropriate clinical setting.  Pulmonologist Dr. Kassie recommends IV Vanco and Zosyn  in the hospital, also stated that no need for transfer to [facility], no indication for bronchoscopy at this time, needs to follow-up with pulmonology for likely repeat imaging, needs extended course of antibiotics for 2 weeks, and on discharge strongly recommends patient to be discharged on Augmentin  to complete 2 weeks of ABX treatment.   Patient to follow-up with pulmonologist Dr. Kassie as an outpatient on 12/03/24. - Continue bronchodilators -No hypoxia  -Blood cultures NGTD     Alcoholic liver disease-recent hospitalization for delirium tremens.  Patient has not had any alcoholic beverage since hospitalization 11/29.   -Continue multivitamin, folic acid  and thiamine    COPD -stable. - Outpt pulm follow up   Generalized Weakness  and deconditioning with ambulatory dysfunction-Outpatient PT arranged.   Disposition: Home with Outpatient PT.       History of Present Illness  11/25/2024  Pulmonary/ 1st office eval/ [facility]  mother says never got ABX  Chief Complaint  Patient presents with   Hospitalization Follow-up    New pt Pna 2 weeks -    Dyspnea:  [employer] at [employer]  Cough: minimal smokers rattle  Sleep: flat bed/ no pillows  SABA use: has inhaler not  using  02: none       No obvious day to day or daytime pattern/variability or assoc excess/ purulent sputum or mucus plugs or hemoptysis or cp or chest tightness, subjective wheeze or overt sinus or hb symptoms.    Also denies any obvious fluctuation of symptoms with weather or environmental changes or other aggravating or alleviating factors except as outlined above   No unusual exposure hx or h/o childhood pna/ asthma or knowledge of premature birth.  Current Allergies, Complete Past Medical History, Past Surgical History, Family History, and Social History were reviewed in [employer] record.  ROS  The following are not active complaints unless bolded Hoarseness, sore throat, dysphagia, dental problems, itching, sneezing,  nasal congestion or discharge of excess mucus or purulent secretions, ear ache,   fever, chills, sweats, unintended wt loss or wt gain, classically pleuritic or exertional cp,  orthopnea pnd or arm/hand swelling  or leg swelling, presyncope, palpitations, abdominal pain, anorexia, nausea, vomiting, diarrhea  or change in bowel habits or change in bladder habits, change in stools or change  in urine, dysuria, hematuria,  rash, arthralgias, visual complaints, headache, numbness, weakness or ataxia or problems with walking or coordination,  change in mood or  memory.            Outpatient Medications Prior to Visit  Medication Sig Dispense Refill   acetaminophen  (TYLENOL ) 325 MG tablet Take 2  tablets (650 mg total) by mouth every 6 (six) hours as needed for mild pain (pain score 1-3) or fever (or Fever >/= 101).     albuterol  (VENTOLIN  HFA) 108 (90 Base) MCG/ACT inhaler Inhale 2 puffs into the lungs every 6 (six) hours as needed for wheezing or shortness of breath. 17 g 1   amLODipine  (NORVASC ) 5 MG tablet Take 5 mg by mouth daily.     folic acid  (FOLVITE ) 1 MG tablet Take 1 tablet (1 mg total) by mouth daily. 90 tablet 3   Nutritional Supplements (ENSURE PLUS PO) Take 8 fluid ounces by mouth 2 (two) times daily. Vanilla     thiamine  (VITAMIN B-1) 100 MG tablet Take 1 tablet (100 mg total) by mouth daily.     guaiFENesin  (ROBITUSSIN) 100 MG/5ML liquid Take 10 mLs by mouth every 4 (four) hours as needed for cough or to loosen phlegm. (Patient not taking: Reported on 11/25/2024)     ipratropium-albuterol  (DUONEB) 0.5-2.5 (3) MG/3ML SOLN Take 3 mLs by nebulization every 6 (six) hours as needed. (Patient not taking: Reported on 11/25/2024)     loratadine (CLARITIN) 10 MG tablet Take 10 mg by mouth at bedtime. (Patient not taking: Reported on 11/25/2024)     magnesium  oxide (MAG-OX) 400 (240 Mg) MG tablet Take 400 mg by mouth daily. (Patient not taking: Reported on 11/25/2024)     Multiple Vitamin (MULTIVITAMIN WITH MINERALS) TABS tablet Take 1 tablet by mouth daily. (Patient not taking: Reported on 11/25/2024)     No facility-administered medications prior to visit.    Past Medical History:  Diagnosis Date   Asthma    Avascular necrosis of hip, right (HCC) 06/08/2021   Hypercholesterolemia    Hypertension       Objective:     BP 117/81   Pulse 83   Ht 5' 7 (1.702 m)   Wt 121 lb (54.9 kg)   SpO2 98% Comment: ra  BMI 18.95 kg/m   SpO2: 98 % (ra) amb bm lets his mother do his talking       HEENT : Oropharynx  clear/ no obvious dental issues       NECK :  without  apparent JVD/ palpable Nodes/TM    LUNGS: no acc muscle use,  Nl contour chest which is clear to A and P  bilaterally without cough on insp or exp maneuvers   CV:  RRR  no s3 or murmur or increase in P2, and no edema   ABD:  soft and nontender   MS:  Gait nl   ext warm without deformities Or obvious joint restrictions  calf tenderness, cyanosis or clubbing    SKIN: warm and dry without lesions    NEURO:  alert, approp, nl sensorium with  no motor or cerebellar deficits apparent.    CXR PA and Lateral:   11/25/2024 :    I personally reviewed images and impression is as follows:     Density RLL s aif fluid level or spherical density c/w partially improved  nec pneumonia or lung abscess      Assessment   Assessment & Plan Abscess of lung with pneumonia,  unspecified laterality (HCC) See admit 11/05/24  - 12/31 /25 > did not take augmentin  at D/c - 11/25/2024 augmentin  x 10 more days and f/u in 4-6 weeks sooner if needed   Comment:  It can take months for a necrotic density to improve so as long as no over clinical sign of infection will just complete the augmentin  rx as outpt an f/u with serial cxrs  Discussed in detail all the  indications, usual  risks and alternatives  relative to the benefits with patient who agrees to proceed with Rx as outlined.       Former cigarette smoker Quit smoking Nov 2025 so eligible for LDSCt thru 2040 @ current guidelines    >>> will place in program once we're sure the present density in the RLL is entirely infectious         Each maintenance medication was reviewed in detail including emphasizing most importantly the difference between maintenance and prns and under what circumstances the prns are to be triggered using an action plan format where appropriate.  Total time for H and P, chart review, counseling, reviewing hfa device(s) and generating customized AVS unique to this office visit / same day charting = 45 min post hosp pt new to me         AVS  Patient Instructions  To get the most out of exercise, you need to be continuously aware  that you are short of breath, but never out of breath, for at least 30 minutes daily. As you improve, it will actually be easier for you to do the same amount of exercise  in  30 minutes so always push to the level where you are short of breath.     Congratulations on not smoking - it's the most important part of your care.   See your dentist as soon as you can to be sure your teeth did not cause your lung infection.   Please remember to go to the  x-ray department  @  [facility] for your tests - we will call you with the results when they are available    Schedule PFTs  and follow up with me same day.     Add: Augmentin  875 mg take one pill twice daily  X 10 days - take at breakfast and supper with large glass of water.  It would help reduce the usual side effects (diarrhea and yeast infections) if you ate cultured yogurt at lunch.        Ozell America, MD 11/26/2024   "

## 2024-11-26 ENCOUNTER — Telehealth: Payer: Self-pay | Admitting: Internal Medicine

## 2024-11-26 DIAGNOSIS — Z87891 Personal history of nicotine dependence: Secondary | ICD-10-CM | POA: Insufficient documentation

## 2024-11-26 MED ORDER — AMOXICILLIN-POT CLAVULANATE 875-125 MG PO TABS: 1.0000 | ORAL_TABLET | Freq: Two times a day (BID) | ORAL | 0 refills | Status: DC

## 2024-11-26 NOTE — Assessment & Plan Note (Addendum)
 Quit smoking Nov 2025 so eligible for LDSCt thru 2040 @ current guidelines    >>> will place in program once we're sure the present density in the RLL is entirely infectious         Each maintenance medication was reviewed in detail including emphasizing most importantly the difference between maintenance and prns and under what circumstances the prns are to be triggered using an action plan format where appropriate.  Total time for H and P, chart review, counseling, reviewing hfa device(s) and generating customized AVS unique to this office visit / same day charting = 45 min post hosp pt new to me

## 2024-11-26 NOTE — Telephone Encounter (Signed)
 Pt needs 4 wk follow up from taking antibx and repeat cxr pls  Called and relayed dr leisa info

## 2024-11-26 NOTE — Telephone Encounter (Signed)
 Let him know I called another 10 d of augmentin  in and he should take them all and return w/in 4 weeks for repeat cxr as the area has not completely cleared yet

## 2024-11-26 NOTE — Assessment & Plan Note (Addendum)
 Seeadmit 11/05/24  - 12/31 /25 > did not take augmentin  at D/c - 11/25/2024 augmentin  x 10 more days and f/u in 4-6 weeks sooner if needed   Comment:  It can take months for a necrotic density to improve so as long as no over clinical sign of infection will just complete the augmentin  rx as outpt an f/u with serial cxrs  Discussed in detail all the  indications, usual  risks and alternatives  relative to the benefits with patient who agrees to proceed with Rx as outlined.

## 2024-11-27 NOTE — Addendum Note (Signed)
 Addended by: DARLEAN SHARPER B on: 11/27/2024 11:58 AM   Modules accepted: Level of Service

## 2024-11-30 NOTE — Telephone Encounter (Signed)
 Patient scheduled.

## 2024-12-01 ENCOUNTER — Ambulatory Visit: Payer: Self-pay | Admitting: Internal Medicine

## 2024-12-03 ENCOUNTER — Inpatient Hospital Stay (HOSPITAL_BASED_OUTPATIENT_CLINIC_OR_DEPARTMENT_OTHER): Admitting: Pulmonary Disease

## 2024-12-11 NOTE — Therapy (Incomplete)
 " OUTPATIENT PHYSICAL THERAPY LOWER EXTREMITY EVALUATION   Patient Name: Donald Stewart MRN: [id number] DOB:May 16, 1971, 54 y.o., male Today's Date: 12/11/2024  END OF SESSION:   Past Medical History:  Diagnosis Date   Asthma    Avascular necrosis of hip, right (HCC) 06/08/2021   Hypercholesterolemia    Hypertension    Past Surgical History:  Procedure Laterality Date   TOTAL HIP ARTHROPLASTY Right    Patient Active Problem List   Diagnosis Date Noted   Former cigarette smoker 11/26/2024   Lung abscess (HCC) 11/25/2024   Physical deconditioning 11/17/2024   Necrotizing pneumonia (HCC) 11/05/2024   Sepsis (HCC) 11/05/2024   Delirium tremens (HCC) 10/21/2024   Hypoglycemia 10/10/2024   Acute recurrent frontal sinusitis 11/28/2023   Acute metabolic encephalopathy 11/05/2023   AKI (acute kidney injury) 09/11/2023   Hospital discharge follow-up 09/11/2023   Alcoholic liver disease 09/11/2023   Macrocytic anemia 09/11/2023   Encounter for examination following treatment at hospital 09/11/2023   Peripheral polyneuropathy 09/11/2023   Fall 08/02/2023   Chronic obstructive pulmonary disease (HCC) 03/27/2023   Other specified nutritional anemias 03/27/2023   Acute bronchitis with COPD (HCC) 07/10/2022   Hypokalemia 07/05/2022   Protein-calorie malnutrition 07/04/2022   Alcohol dependence with unspecified alcohol-induced disorder (HCC) 07/04/2022   Allergic sinusitis 07/04/2022   Encounter for general adult medical examination with abnormal findings 11/09/2021   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  Rationale for Evaluation and Treatment: {HABREHAB:27488}  ONSET DATE: ***  SUBJECTIVE:   SUBJECTIVE STATEMENT: ***  PERTINENT  HISTORY: *** PAIN:  Are you having pain? {OPRCPAIN:27236}  PRECAUTIONS: {Therapy precautions:24002}  RED FLAGS: {PT Red Flags:29287}   WEIGHT BEARING RESTRICTIONS: {Yes ***/No:24003}  FALLS:  Has patient fallen in last 6 months? {fallsyesno:27318}  LIVING ENVIRONMENT: Lives with: {OPRC lives with:25569::lives with their family} Lives in: {Lives in:25570} Stairs: {opstairs:27293} Has following equipment at home: {Assistive devices:23999}  OCCUPATION: ***  PLOF: {PLOF:24004}  PATIENT GOALS: ***  NEXT MD VISIT: ***  OBJECTIVE:  Note: Objective measures were completed at Evaluation unless otherwise noted.  DIAGNOSTIC FINDINGS: ***  PATIENT SURVEYS:  {rehab surveys:24030}  COGNITION: Overall cognitive status: {cognition:24006}     SENSATION: {sensation:27233}  EDEMA:  {edema:24020}  MUSCLE LENGTH: Hamstrings: Right *** deg; Left *** deg Debby test: Right *** deg; Left *** deg  POSTURE: {posture:25561}  PALPATION: ***  LOWER EXTREMITY ROM:  {AROM/PROM:27142} ROM Right eval Left eval  Hip flexion    Hip extension    Hip abduction    Hip adduction    Hip internal rotation    Hip external rotation    Knee flexion    Knee extension    Ankle dorsiflexion    Ankle plantarflexion    Ankle inversion    Ankle eversion     (Blank rows = not tested)  LOWER EXTREMITY MMT:  MMT Right eval Left eval  Hip flexion    Hip extension    Hip abduction    Hip adduction    Hip internal rotation    Hip external rotation    Knee flexion    Knee extension    Ankle dorsiflexion    Ankle plantarflexion    Ankle inversion    Ankle eversion     (Blank  rows = not tested)  LOWER EXTREMITY SPECIAL TESTS:  {LEspecialtests:26242}  FUNCTIONAL TESTS:  {Functional tests:24029}  GAIT: Distance walked: *** Assistive device utilized: {Assistive devices:23999} Level of assistance: {Levels of assistance:24026} Comments: ***                                                                                                                                 TREATMENT DATE: ***    PATIENT EDUCATION:  Education details: *** Person educated: {Person educated:25204} Education method: {Education Method:25205} Education comprehension: {Education Comprehension:25206}  HOME EXERCISE PROGRAM: ***  ASSESSMENT:  CLINICAL IMPRESSION: Patient is a *** y.o. *** who was seen today for physical therapy evaluation and treatment for ***.   OBJECTIVE IMPAIRMENTS: {opptimpairments:25111}.   ACTIVITY LIMITATIONS: {activitylimitations:27494}  PARTICIPATION LIMITATIONS: {participationrestrictions:25113}  PERSONAL FACTORS: {Personal factors:25162} are also affecting patient's functional outcome.   REHAB POTENTIAL: {rehabpotential:25112}  CLINICAL DECISION MAKING: {clinical decision making:25114}  EVALUATION COMPLEXITY: {Evaluation complexity:25115}   GOALS: Goals reviewed with patient? {yes/no:20286}  SHORT TERM GOALS: Target date: *** *** Baseline: Goal status: INITIAL  2.  *** Baseline:  Goal status: INITIAL  3.  *** Baseline:  Goal status: INITIAL  4.  *** Baseline:  Goal status: INITIAL  5.  *** Baseline:  Goal status: INITIAL  6.  *** Baseline:  Goal status: INITIAL  LONG TERM GOALS: Target date: ***  *** Baseline:  Goal status: INITIAL  2.  *** Baseline:  Goal status: INITIAL  3.  *** Baseline:  Goal status: INITIAL  4.  *** Baseline:  Goal status: INITIAL  5.  *** Baseline:  Goal status: INITIAL  6.  *** Baseline:  Goal status: INITIAL   PLAN:  PT FREQUENCY: {rehab frequency:25116}  PT DURATION: {rehab duration:25117}  PLANNED INTERVENTIONS: {rehab planned interventions:25118::97110-Therapeutic exercises,97530- Therapeutic [phone]- Neuromuscular re-education,97535- Self Rjmz,02859- Manual therapy,Patient/Family education}  PLAN FOR NEXT SESSION: ***   Greig GORMAN Quivers,  PT 12/11/2024, 8:27 AM  "

## 2024-12-14 ENCOUNTER — Ambulatory Visit (HOSPITAL_COMMUNITY)

## 2024-12-15 ENCOUNTER — Ambulatory Visit: Payer: Self-pay | Admitting: Internal Medicine

## 2024-12-16 ENCOUNTER — Encounter: Payer: Self-pay | Admitting: Internal Medicine

## 2024-12-16 ENCOUNTER — Ambulatory Visit: Payer: Self-pay | Admitting: Internal Medicine

## 2024-12-16 VITALS — BP 109/72 | HR 79 | Ht 67.0 in | Wt 132.0 lb

## 2024-12-16 DIAGNOSIS — M79671 Pain in right foot: Secondary | ICD-10-CM | POA: Diagnosis not present

## 2024-12-16 MED ORDER — MELOXICAM 7.5 MG PO TABS: 7.5000 mg | ORAL_TABLET | Freq: Every day | ORAL | 2 refills | Status: AC

## 2024-12-16 NOTE — Therapy (Unsigned)
 " OUTPATIENT PHYSICAL THERAPY LOWER EXTREMITY EVALUATION   Patient Name: Donald Stewart MRN: [id number] DOB:[birth year], 54 y.o., male Today's Date: 12/17/2024  END OF SESSION:  PT End of Session - 12/17/24 1024     Visit Number 1    Number of Visits 1    Date for Recertification  --    Authorization Type [employer] COMM PPO    Authorization Time Period --    Progress Note Due on Visit --    PT Start Time 1032    PT Stop Time 1102    PT Time Calculation (min) 30 min    Activity Tolerance Patient tolerated treatment well    Behavior During Therapy WFL for tasks assessed/performed          Past Medical History:  Diagnosis Date   Asthma    Avascular necrosis of hip, right (HCC) 06/08/2021   Hypercholesterolemia    Hypertension    Past Surgical History:  Procedure Laterality Date   TOTAL HIP ARTHROPLASTY Right    Patient Active Problem List   Diagnosis Date Noted   Right foot pain 12/16/2024   Former cigarette smoker 11/26/2024   Lung abscess (HCC) 11/25/2024   Physical deconditioning 11/17/2024   Necrotizing pneumonia (HCC) 11/05/2024   Sepsis (HCC) 11/05/2024   Delirium tremens (HCC) 10/21/2024   Hypoglycemia 10/10/2024   Acute recurrent frontal sinusitis 11/28/2023   Acute metabolic encephalopathy 11/05/2023   AKI (acute kidney injury) 09/11/2023   Hospital discharge follow-up 09/11/2023   Alcoholic liver disease 09/11/2023   Macrocytic anemia 09/11/2023   Encounter for examination following treatment at hospital 09/11/2023   Peripheral polyneuropathy 09/11/2023   Fall 08/02/2023   Chronic obstructive pulmonary disease (HCC) 03/27/2023   Other specified nutritional anemias 03/27/2023   Acute bronchitis with COPD (HCC) 07/10/2022   Hypokalemia 07/05/2022   Protein-calorie malnutrition 07/04/2022   Alcohol dependence with unspecified alcohol-induced disorder (HCC) 07/04/2022   Allergic sinusitis 07/04/2022   Encounter for general adult medical examination with  abnormal findings 11/09/2021   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 Reports he was discharged from rehab about a month ago. Reports he hasn't had any falls or concerns. Just went back to work on Monday Full time without any issues.   PERTINENT HISTORY: He still reports bilateral leg weakness, and has not been able to return to work yet. He is able to walk short distances in the home, but is unable to stand for more than 15 minutes continuously. Does not report any new numbness or tingling of the LE.   PAIN:  Are you having pain? No  PRECAUTIONS: None  RED FLAGS: None   WEIGHT BEARING RESTRICTIONS: No  FALLS:  Has patient fallen in last 6 months? No  LIVING ENVIRONMENT: Lives with: lives with their family Lives in: House/apartment Has following equipment at home: Single point cane and Environmental Consultant - 2 wheeled  OCCUPATION: Sanitation  PLOF: Independent  PATIENT GOALS: N/A. Doesn't feel he needs PT.   NEXT MD VISIT: [place] yesterday to see  MD Tobie   OBJECTIVE:  Note: Objective measures were completed at Evaluation unless otherwise noted.  DIAGNOSTIC FINDINGS:   PATIENT SURVEYS:  LEFS  Extreme difficulty/unable (0), Quite a bit of difficulty (1), Moderate difficulty (2), Little difficulty (3), No difficulty (4) Survey date:    Any of your usual work, housework or school activities   2. Usual hobbies, recreational or sporting  activities   3. Getting into/out of the bath   4. Walking between rooms   5. Putting on socks/shoes   6. Squatting    7. Lifting an object, like a bag of groceries from the floor   8. Performing light activities around your home   9. Performing heavy activities around your home   10. Getting into/out of a car   11. Walking 2 blocks   12. Walking 1 mile   13. Going up/down 10 stairs (1 flight)   14. Standing for 1 hour   15.  sitting for 1 hour   16. Running on even ground   17. Running on uneven ground   18. Making sharp turns while running fast   19. Hopping    20. Rolling over in bed   Score total:  Lower Extremity Functional Score: 78 / 80 = 97.5 %      COGNITION: Overall cognitive status: Within functional limits for tasks assessed     SENSATION: WFL    LOWER EXTREMITY MMT:  MMT Right eval Left eval  Hip flexion 4- 4-  Hip extension 4- 4-  Hip abduction 4- 4-  Hip adduction    Hip internal rotation    Hip external rotation    Knee flexion 4+ 4+  Knee extension 4+ 4+  Ankle dorsiflexion 5 5  Ankle plantarflexion    Ankle inversion    Ankle eversion     (Blank rows = not tested)   FUNCTIONAL TESTS:  30 seconds chair stand test Timed up and go (TUG): 9 sec 2 minute walk test: 443 ft  30 second: 13 STS  GAIT: Distance walked: 443 ft+ Assistive device utilized: None Level of assistance: Complete Independence Comments: WFL                                                                                                                                TREATMENT DATE:  12/17/24: PT eval and HEP administered     PATIENT EDUCATION:  Education details: PT evaluation, objective findings, POC, Importance of HEP, Precautions, Clinic policies Person educated: Patient Education method: Explanation and Demonstration Education comprehension: verbalized understanding and returned demonstration  HOME EXERCISE PROGRAM: Access Code: [email or web address] URL:  [email or web address] Date: 12/17/2024 Prepared by: Rosaria Powell-Butler  Exercises - Sit to Stand Without Arm Support  - 2 x daily - 7 x weekly - 3 sets - 10 reps - Standing Hip Extension with Counter Support  - 2 x daily - 7 x weekly - 3 sets - 10 reps -  Standing Hip Abduction with Counter Support  - 2 x daily - 7 x weekly - 3 sets - 10 reps - Standing March with Counter Support  - 2 x daily - 7 x weekly - 3 sets - 10 reps  ASSESSMENT:  CLINICAL IMPRESSION: Patient is a 54 y.o. male who was seen today for physical therapy evaluation and treatment for J85.0 (ICD-10-CM) - Necrotizing pneumonia (HCC) R53.81 (ICD-10-CM) - Physical deconditioning.  On this date, patient demonstrates good self perception of function, endurance, and gait without risk of falls but does demonstrate some decreased LE strength, especially in hips bilaterally. Patient demonstrates understanding of administered HEP. PT gives strategies to increase difficulty with exercises should they get easy, ie. Adding resistance and changing position of resistance bands. Patient reports understanding and given resistance bands. 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 Patient does not present with urgent need for skilled physical therapy at this time.  Pt discharged this date.    OBJECTIVE IMPAIRMENTS: decreased strength.   ACTIVITY LIMITATIONS: N/A  PARTICIPATION LIMITATIONS: meal prep and cleaning  PERSONAL FACTORS: N/A are also affecting patient's functional outcome.   REHAB POTENTIAL: Good  CLINICAL DECISION MAKING: Stable/uncomplicated  EVALUATION COMPLEXITY: Low   GOALS: Goals reviewed with patient? No  SHORT TERM GOALS: Target date: N/A. Pt discharged    LONG TERM GOALS: Target date: N/A. Pt discharged     PLAN:  PT FREQUENCY: one time visit  PT DURATION: other: one time visit  PLANNED  INTERVENTIONS: N/A. Pt discharged   PLAN FOR NEXT SESSION: N/A. Pt discharged    11:18 AM, 12/17/24 Rosaria Settler, PT, DPT [facility] "

## 2024-12-16 NOTE — Assessment & Plan Note (Addendum)
 Likely related to direct impact from steel toe shoes Work note provided for refitting of the shoes Meloxicam  as needed for pain Leg elevation as tolerated for leg swelling If persistent or worsening, may need imaging and/or podiatry evaluation

## 2024-12-16 NOTE — Progress Notes (Signed)
 "  Acute Office Visit  Subjective:    Patient ID: Donald Stewart, male    DOB: 1971/05/02, 54 y.o.   MRN: [id number]  Chief Complaint  Patient presents with   Foot Problem    Steal toe boots causing issues     HPI Patient is in today for complaint of right foot pain, on the dorsum of the foot for the last 3 days.  He recently started working again and he attributes the pain to the steel toe shoes that he has to wear at the workplace.  His pain is intermittent, sharp, nonradiating and better with rest.  He has mild swelling of the right foot as well.  Past Medical History:  Diagnosis Date   Asthma    Avascular necrosis of hip, right (HCC) 06/08/2021   Hypercholesterolemia    Hypertension     Past Surgical History:  Procedure Laterality Date   TOTAL HIP ARTHROPLASTY Right     Family History  Problem Relation Age of Onset   Hypertension Mother     Social History   Socioeconomic History   Marital status: Divorced    Spouse name: Not on file   Number of children: Not on file   Years of education: Not on file   Highest education level: Not on file  Occupational History   Not on file  Tobacco Use   Smoking status: Former    Current packs/day: 0.00    Average packs/day: 0.5 packs/day for 20.0 years (10.0 ttl pk-yrs)    Types: Cigarettes    Quit date: 10/10/2024    Years since quitting: 0.1   Smokeless tobacco: Never   Tobacco comments:    1 1/2 cig a day  Vaping Use   Vaping status: Never Used  Substance and Sexual Activity   Alcohol use: Not Currently    Comment: drinks until he passes out daily. has no desire to stop   Drug use: No   Sexual activity: Not on file  Other Topics Concern   Not on file  Social History Narrative   Not on file   Social Drivers of Health   Tobacco Use: Medium Risk (12/16/2024)   Patient History    Smoking Tobacco Use: Former    Smokeless Tobacco Use: Never    Passive Exposure: Not on Actuary Strain: Not on  file  Food Insecurity: No Food Insecurity (11/05/2024)   Epic    Worried About Programme Researcher, Broadcasting/film/video in the Last Year: Never true    Ran Out of Food in the Last Year: Never true  Transportation Needs: Patient Unable To Answer (11/05/2024)   Epic    Lack of Transportation (Medical): Patient unable to answer    Lack of Transportation (Non-Medical): Patient unable to answer  Physical Activity: Not on file  Stress: Not on file  Social Connections: Not on file  Intimate Partner Violence: Not At Risk (11/05/2024)   Epic    Fear of Current or Ex-Partner: No    Emotionally Abused: No    Physically Abused: No    Sexually Abused: No  Depression (PHQ2-9): Low Risk (12/16/2024)   Depression (PHQ2-9)    PHQ-2 Score: 4  Alcohol Screen: Not on file  Housing: Unknown (11/05/2024)   Epic    Unable to Pay for Housing in the Last Year: Patient unable to answer    Number of Times Moved in the Last Year: 1    Homeless in the Last Year: Patient unable  to answer  Utilities: Not At Risk (11/05/2024)   Epic    Threatened with loss of utilities: No  Health Literacy: Not on file    Outpatient Medications Prior to Visit  Medication Sig Dispense Refill   acetaminophen  (TYLENOL ) 325 MG tablet Take 2 tablets (650 mg total) by mouth every 6 (six) hours as needed for mild pain (pain score 1-3) or fever (or Fever >/= 101).     albuterol  (VENTOLIN  HFA) 108 (90 Base) MCG/ACT inhaler Inhale 2 puffs into the lungs every 6 (six) hours as needed for wheezing or shortness of breath. 17 g 1   amLODipine  (NORVASC ) 5 MG tablet Take 5 mg by mouth daily.     amoxicillin -clavulanate (AUGMENTIN ) 875-125 MG tablet Take 1 tablet by mouth 2 (two) times daily. 20 tablet 0   folic acid  (FOLVITE ) 1 MG tablet Take 1 tablet (1 mg total) by mouth daily. 90 tablet 3   guaiFENesin  (ROBITUSSIN) 100 MG/5ML liquid Take 10 mLs by mouth every 4 (four) hours as needed for cough or to loosen phlegm. (Patient not taking: Reported on 11/25/2024)      ipratropium-albuterol  (DUONEB) 0.5-2.5 (3) MG/3ML SOLN Take 3 mLs by nebulization every 6 (six) hours as needed. (Patient not taking: Reported on 11/25/2024)     loratadine (CLARITIN) 10 MG tablet Take 10 mg by mouth at bedtime. (Patient not taking: Reported on 11/25/2024)     magnesium  oxide (MAG-OX) 400 (240 Mg) MG tablet Take 400 mg by mouth daily. (Patient not taking: Reported on 11/25/2024)     Multiple Vitamin (MULTIVITAMIN WITH MINERALS) TABS tablet Take 1 tablet by mouth daily. (Patient not taking: Reported on 11/25/2024)     Nutritional Supplements (ENSURE PLUS PO) Take 8 fluid ounces by mouth 2 (two) times daily. Vanilla     thiamine  (VITAMIN B-1) 100 MG tablet Take 1 tablet (100 mg total) by mouth daily.     No facility-administered medications prior to visit.    Allergies[1]  Review of Systems  Constitutional:  Positive for fatigue. Negative for chills and fever.  HENT:  Negative for congestion and postnasal drip.   Eyes:  Negative for pain and discharge.  Respiratory:  Negative for cough and shortness of breath.   Cardiovascular:  Negative for chest pain and palpitations.  Gastrointestinal:  Negative for diarrhea, nausea and vomiting.  Endocrine: Negative for polydipsia and polyuria.  Genitourinary:  Negative for dysuria and hematuria.  Musculoskeletal:  Negative for neck pain and neck stiffness.       Right foot pain  Skin:  Negative for rash.  Neurological:  Positive for weakness (B/l legs). Negative for dizziness.  Psychiatric/Behavioral:  Negative for agitation and behavioral problems.        Objective:    Physical Exam Vitals reviewed.  Constitutional:      General: He is not in acute distress.    Appearance: He is cachectic. He is not diaphoretic.  HENT:     Head: Normocephalic and atraumatic.     Nose: Congestion present.     Mouth/Throat:     Mouth: Mucous membranes are moist.     Pharynx: No posterior oropharyngeal erythema.  Eyes:     General: No  scleral icterus.    Extraocular Movements: Extraocular movements intact.  Cardiovascular:     Rate and Rhythm: Normal rate and regular rhythm.     Heart sounds: Normal heart sounds. No murmur heard. Pulmonary:     Breath sounds: Normal breath sounds. No wheezing or rales.  Musculoskeletal:     Cervical back: Neck supple. No tenderness.     Right lower leg: No edema.     Left lower leg: No edema.  Feet:     Comments: Tenderness over dorsum of right foot with mild swelling Skin:    General: Skin is warm.     Findings: No rash.  Neurological:     General: No focal deficit present.     Mental Status: He is alert and oriented to person, place, and time.     Sensory: No sensory deficit.     Motor: Weakness (B/l LE - 3/5) present.  Psychiatric:        Mood and Affect: Mood normal.        Behavior: Behavior normal.     BP 109/72   Pulse 79   Ht 5' 7 (1.702 m)   Wt 132 lb (59.9 kg)   SpO2 97%   BMI 20.67 kg/m  Wt Readings from Last 3 Encounters:  12/16/24 132 lb (59.9 kg)  11/25/24 121 lb (54.9 kg)  11/17/24 118 lb (53.5 kg)        Assessment & Plan:   Problem List Items Addressed This Visit       Other   Right foot pain - Primary   Likely related to direct impact from steel toe shoes Work note provided for refitting of the shoes Meloxicam  as needed for pain Leg elevation as tolerated for leg swelling If persistent or worsening, may need imaging and/or podiatry evaluation      Relevant Medications   meloxicam  (MOBIC ) 7.5 MG tablet     Meds ordered this encounter  Medications   meloxicam  (MOBIC ) 7.5 MG tablet    Sig: Take 1 tablet (7.5 mg total) by mouth daily.    Dispense:  30 tablet    Refill:  2     Monroe Toure MARLA Blanch, MD     [1] No Known Allergies  "

## 2024-12-16 NOTE — Patient Instructions (Signed)
 Please take Meloxicam  as needed for right foot pain.

## 2024-12-17 ENCOUNTER — Ambulatory Visit (HOSPITAL_COMMUNITY)

## 2024-12-17 ENCOUNTER — Other Ambulatory Visit: Payer: Self-pay

## 2024-12-17 ENCOUNTER — Encounter (HOSPITAL_COMMUNITY): Payer: Self-pay

## 2024-12-17 DIAGNOSIS — M6281 Muscle weakness (generalized): Secondary | ICD-10-CM

## 2024-12-17 DIAGNOSIS — R5381 Other malaise: Secondary | ICD-10-CM

## 2024-12-18 ENCOUNTER — Encounter: Payer: Self-pay | Admitting: Internal Medicine

## 2024-12-18 ENCOUNTER — Ambulatory Visit: Admitting: Internal Medicine

## 2024-12-18 ENCOUNTER — Ambulatory Visit (HOSPITAL_COMMUNITY): Admission: RE | Admit: 2024-12-18 | Source: Ambulatory Visit

## 2024-12-18 VITALS — BP 125/74 | HR 75 | Ht 67.0 in | Wt 129.0 lb

## 2024-12-18 DIAGNOSIS — J851 Abscess of lung with pneumonia: Secondary | ICD-10-CM

## 2024-12-18 NOTE — Progress Notes (Unsigned)
 "   Donald Stewart, male    DOB: 04/29/1971    MRN: [id number]   Brief patient profile:  53  yobm  quit smoking Nov 2025  referred to pulmonary clinic in [place]  11/25/2024 by [facility]  for post hops f/u.      Admit date:     11/05/2024  Discharge date: 11/11/2024   Discharge Diagnoses:  Necrotizing pneumonia (HCC)   Sepsis (HCC)   Primary hypertension   Chronic obstructive pulmonary disease (HCC)   Alcoholic liver disease    History of Present Illness  11/25/2024  Pulmonary/ 1st office eval/ [facility] / [facility]  mother says never got ABX  Chief Complaint  Patient presents with   Hospitalization Follow-up    New pt Pna 2 weeks -    Dyspnea:  [employer] at [employer]  Cough: minimal smokers rattle  Sleep: flat bed/ no pillows  SABA use: has inhaler not  using  02: none Patient Instructions  To get the most out of exercise, you need to be continuously aware that you are short of breath, but never out of breath, for at least 30 minutes daily.   Congratulations on not smoking - it's the most important part of your care.  See your dentist as soon as you can to be sure your teeth did not cause your lung infection Schedule PFTs  and follow up with me same day.    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    Also denies any obvious fluctuation of symptoms with weather or  environmental changes or other aggravating or alleviating factors except as outlined above   No unusual exposure hx or h/o childhood pna/ asthma or knowledge of premature birth.  Current Allergies, Complete Past Medical History, Past Surgical History, Family History, and Social History were reviewed in [employer] record.  ROS  The following are not active complaints unless bolded Hoarseness, sore throat, dysphagia, dental problems, itching, sneezing,  nasal congestion or discharge of excess mucus or purulent secretions, ear ache,   fever, chills, sweats, unintended wt loss or wt gain, classically pleuritic or exertional cp,  orthopnea pnd or arm/hand swelling  or leg swelling, presyncope, palpitations, abdominal pain, anorexia, nausea, vomiting, diarrhea  or change in bowel habits or change in bladder habits, change in stools or change in urine, dysuria, hematuria,  rash, arthralgias, visual complaints, headache, numbness, weakness or ataxia or problems with walking or coordination,  change in mood or  memory.         Outpatient Medications Prior to Visit  Medication Sig Dispense Refill   acetaminophen  (TYLENOL ) 325 MG tablet Take 2 tablets (650 mg total) by mouth every 6 (six) hours as needed for mild pain (pain score 1-3) or  fever (or Fever >/= 101).     albuterol  (VENTOLIN  HFA) 108 (90 Base) MCG/ACT inhaler Inhale 2 puffs into the lungs every 6 (six) hours as needed for wheezing or shortness of breath. 17 g 1   amLODipine  (NORVASC ) 5 MG tablet Take 5 mg by mouth daily.     folic acid  (FOLVITE ) 1 MG tablet Take 1 tablet (1 mg total) by mouth daily. 90 tablet 3   meloxicam  (MOBIC ) 7.5 MG tablet Take 1 tablet (7.5 mg total) by mouth daily. 30 tablet 2   Nutritional Supplements (ENSURE PLUS PO) Take 8 fluid ounces by mouth 2 (two) times daily. Vanilla     thiamine  (VITAMIN B-1) 100 MG tablet Take 1 tablet (100 mg total) by mouth daily.     amoxicillin -clavulanate  (AUGMENTIN ) 875-125 MG tablet Take 1 tablet by mouth 2 (two) times daily. 20 tablet 0   ipratropium-albuterol  (DUONEB) 0.5-2.5 (3) MG/3ML SOLN Take 3 mLs by nebulization every 6 (six) hours as needed. (Patient not taking: Reported on 12/18/2024)     loratadine (CLARITIN) 10 MG tablet Take 10 mg by mouth at bedtime. (Patient not taking: Reported on 12/18/2024)     magnesium  oxide (MAG-OX) 400 (240 Mg) MG tablet Take 400 mg by mouth daily. (Patient not taking: Reported on 12/18/2024)     Multiple Vitamin (MULTIVITAMIN WITH MINERALS) TABS tablet Take 1 tablet by mouth daily. (Patient not taking: Reported on 12/18/2024)     guaiFENesin  (ROBITUSSIN) 100 MG/5ML liquid Take 10 mLs by mouth every 4 (four) hours as needed for cough or to loosen phlegm. (Patient not taking: Reported on 12/18/2024)     No facility-administered medications prior to visit.         Past Medical History:  Diagnosis Date   Asthma    Avascular necrosis of hip, right (HCC) 06/08/2021   Hypercholesterolemia    Hypertension       Objective:    Wts   12/18/2024         129   12/16/24 132 lb (59.9 kg)  11/25/24 121 lb (54.9 kg)  11/17/24 118 lb (53.5 kg)      Vital signs reviewed  12/18/2024  - Note at rest 02 sats  98% on RA   General appearance:    amb somber bm lets his mother do all the talking    HEENT : Oropharynx  clear/ poor dental hygiene but no obviously infected teeth     Nasal turbinates nl    NECK :  without  apparent JVD/ palpable Nodes/TM    LUNGS: no acc muscle use,  Nl contour chest which is clear to A and P bilaterally without cough on insp or exp maneuvers   CV:  RRR  no s3 or murmur or increase in P2, and no edema   ABD:  soft and nontender   MS:  Gait nl   ext warm without deformities Or obvious joint restrictions  calf tenderness, cyanosis or clubbing/ sits with head near max flexion but able to extend it normally s pain on requiest.   SKIN: warm and dry without lesions    NEURO:  alert,  approp, nl sensorium with  no motor or cerebellar deficits apparent.        CXR PA and Lateral:   12/18/2024 :    I personally reviewed images and impression is as follows:    Marked serial improvement R post basilar densities     Assessment            "

## 2024-12-18 NOTE — Patient Instructions (Addendum)
 Please remember to go to the  x-ray department  @  [facility] for your tests - we will call you with the results when they are available      Keep your march 17 appointment at 9 am Pulmonary dept for PFTs - office visit with me at 11:30 am  same day with all medications /inhalers/ solutions in hand so we can verify exactly what you are taking. This includes all medications from all doctors and over the counters

## 2025-01-26 ENCOUNTER — Ambulatory Visit: Admitting: Internal Medicine

## 2025-01-26 ENCOUNTER — Encounter (HOSPITAL_COMMUNITY)

## 2025-01-28 ENCOUNTER — Ambulatory Visit: Admitting: Internal Medicine
